# Patient Record
Sex: Male | Born: 1955
Health system: Southern US, Community
[De-identification: ages and names within clinical notes are randomized; demographics above are authoritative.]

## PROBLEM LIST (undated history)

## (undated) DIAGNOSIS — R911 Solitary pulmonary nodule: Secondary | ICD-10-CM

## (undated) DIAGNOSIS — Z87442 Personal history of urinary calculi: Secondary | ICD-10-CM

## (undated) DIAGNOSIS — K760 Fatty (change of) liver, not elsewhere classified: Secondary | ICD-10-CM

## (undated) DIAGNOSIS — I5189 Other ill-defined heart diseases: Secondary | ICD-10-CM

## (undated) DIAGNOSIS — G4730 Sleep apnea, unspecified: Secondary | ICD-10-CM

## (undated) DIAGNOSIS — I2510 Atherosclerotic heart disease of native coronary artery without angina pectoris: Secondary | ICD-10-CM

## (undated) DIAGNOSIS — E785 Hyperlipidemia, unspecified: Secondary | ICD-10-CM

## (undated) DIAGNOSIS — H269 Unspecified cataract: Secondary | ICD-10-CM

## (undated) DIAGNOSIS — I5032 Chronic diastolic (congestive) heart failure: Secondary | ICD-10-CM

## (undated) DIAGNOSIS — H5440 Blindness, one eye, unspecified eye: Secondary | ICD-10-CM

## (undated) DIAGNOSIS — Z8719 Personal history of other diseases of the digestive system: Secondary | ICD-10-CM

## (undated) DIAGNOSIS — R0789 Other chest pain: Secondary | ICD-10-CM

## (undated) DIAGNOSIS — C801 Malignant (primary) neoplasm, unspecified: Secondary | ICD-10-CM

## (undated) DIAGNOSIS — R945 Abnormal results of liver function studies: Secondary | ICD-10-CM

## (undated) DIAGNOSIS — R0602 Shortness of breath: Secondary | ICD-10-CM

## (undated) DIAGNOSIS — I509 Heart failure, unspecified: Secondary | ICD-10-CM

## (undated) DIAGNOSIS — I10 Essential (primary) hypertension: Secondary | ICD-10-CM

## (undated) DIAGNOSIS — K219 Gastro-esophageal reflux disease without esophagitis: Secondary | ICD-10-CM

## (undated) HISTORY — DX: Unspecified cataract: H26.9

## (undated) HISTORY — DX: Blindness, one eye, unspecified eye: H54.40

## (undated) HISTORY — DX: Gastro-esophageal reflux disease without esophagitis: K21.9

## (undated) HISTORY — DX: Sleep apnea, unspecified: G47.30

## (undated) HISTORY — DX: Fatty (change of) liver, not elsewhere classified: K76.0

## (undated) HISTORY — PX: COLONOSCOPY: SHX174

## (undated) HISTORY — DX: Other chest pain: R07.89

## (undated) HISTORY — DX: Chronic diastolic (congestive) heart failure: I50.32

## (undated) HISTORY — DX: Personal history of other diseases of the digestive system: Z87.19

## (undated) HISTORY — PX: SKIN CANCER EXCISION: SHX779

## (undated) HISTORY — DX: Abnormal results of liver function studies: R94.5

## (undated) HISTORY — DX: Malignant (primary) neoplasm, unspecified: C80.1

## (undated) HISTORY — DX: Other ill-defined heart diseases: I51.89

## (undated) HISTORY — DX: Atherosclerotic heart disease of native coronary artery without angina pectoris: I25.10

## (undated) HISTORY — DX: Hyperlipidemia, unspecified: E78.5

## (undated) HISTORY — DX: Shortness of breath: R06.02

## (undated) HISTORY — DX: Essential (primary) hypertension: I10

## (undated) HISTORY — PX: CARDIAC CATHETERIZATION: SHX172

## (undated) HISTORY — DX: Solitary pulmonary nodule: R91.1

---
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
## 2004-07-04 ENCOUNTER — Ambulatory Visit: Payer: Self-pay | Admitting: Gastroenterology

## 2004-07-18 ENCOUNTER — Ambulatory Visit: Payer: Self-pay | Admitting: Gastroenterology

## 2005-08-28 ENCOUNTER — Ambulatory Visit: Payer: Self-pay | Admitting: Internal Medicine

## 2006-03-20 ENCOUNTER — Emergency Department (HOSPITAL_COMMUNITY): Admission: EM | Admit: 2006-03-20 | Discharge: 2006-03-20 | Payer: Self-pay | Admitting: Emergency Medicine

## 2006-07-02 ENCOUNTER — Ambulatory Visit: Payer: Self-pay | Admitting: Internal Medicine

## 2006-10-29 ENCOUNTER — Ambulatory Visit: Payer: Self-pay | Admitting: Internal Medicine

## 2006-10-29 LAB — CONVERTED CEMR LAB
ALT: 50 units/L — ABNORMAL HIGH (ref 0–40)
AST: 36 units/L (ref 0–37)
Albumin: 3.8 g/dL (ref 3.5–5.2)
BUN: 21 mg/dL (ref 6–23)
Basophils Relative: 0.4 % (ref 0.0–1.0)
Bilirubin Urine: NEGATIVE
Bilirubin, Direct: 0.1 mg/dL (ref 0.0–0.3)
CO2: 31 meq/L (ref 19–32)
Calcium: 9.5 mg/dL (ref 8.4–10.5)
Cholesterol: 213 mg/dL (ref 0–200)
Crystals: NEGATIVE
Direct LDL: 146.8 mg/dL
Eosinophils Absolute: 0.1 10*3/uL (ref 0.0–0.6)
GFR calc non Af Amer: 76 mL/min
HCT: 45.6 % (ref 39.0–52.0)
HDL: 37.9 mg/dL — ABNORMAL LOW (ref >39.0)
Hemoglobin: 15.7 g/dL (ref 13.0–17.0)
Ketones, ur: NEGATIVE mg/dL
MCV: 87.1 fL (ref 78.0–100.0)
Monocytes Absolute: 0.4 10*3/uL (ref 0.2–0.7)
Mucus, UA: NEGATIVE
Neutro Abs: 3.5 10*3/uL (ref 1.4–7.7)
Nitrite: NEGATIVE
PSA: 1.12 ng/mL (ref 0.10–4.00)
Platelets: 196 10*3/uL (ref 150–400)
Sodium: 140 meq/L (ref 135–145)
Specific Gravity, Urine: >=1.03 (ref 1.000–1.03)
Squamous Epithelial / LPF: NEGATIVE /lpf
VLDL: 23 mg/dL (ref 0–40)
WBC, UA: NONE SEEN cells/hpf
pH: 6 (ref 5.0–8.0)

## 2006-11-05 ENCOUNTER — Ambulatory Visit: Payer: Self-pay | Admitting: Internal Medicine

## 2006-11-26 ENCOUNTER — Encounter: Admission: RE | Admit: 2006-11-26 | Discharge: 2006-11-26 | Payer: Self-pay | Admitting: Internal Medicine

## 2007-11-17 ENCOUNTER — Ambulatory Visit: Payer: Self-pay | Admitting: Internal Medicine

## 2007-11-17 LAB — CONVERTED CEMR LAB
AST: 24 units/L (ref 0–37)
Albumin: 4.2 g/dL (ref 3.5–5.2)
Alkaline Phosphatase: 56 units/L (ref 39–117)
BUN: 21 mg/dL (ref 6–23)
Basophils Relative: 0.6 % (ref 0.0–1.0)
Bilirubin, Direct: 0.1 mg/dL (ref 0.0–0.3)
Calcium: 9.7 mg/dL (ref 8.4–10.5)
Cholesterol: 212 mg/dL (ref 0–200)
Direct LDL: 144.3 mg/dL
GFR calc Af Amer: 92 mL/min
GFR calc non Af Amer: 76 mL/min
HCT: 43 % (ref 39.0–52.0)
Hemoglobin: 14.4 g/dL (ref 13.0–17.0)
Leukocytes, UA: NEGATIVE
Lymphocytes Relative: 26 % (ref 12.0–46.0)
Neutrophils Relative %: 65 % (ref 43.0–77.0)
PSA: 1.02 ng/mL (ref 0.10–4.00)
Potassium: 4.4 meq/L (ref 3.5–5.1)
RBC: 4.93 M/uL (ref 4.22–5.81)
Sodium: 140 meq/L (ref 135–145)
Specific Gravity, Urine: >=1.03 (ref 1.000–1.03)
Triglycerides: 111 mg/dL (ref 0–149)
Urine Glucose: NEGATIVE mg/dL
Urobilinogen, UA: 0.2 (ref 0.0–1.0)
WBC: 6.7 10*3/uL (ref 4.5–10.5)

## 2007-11-25 ENCOUNTER — Ambulatory Visit: Payer: Self-pay | Admitting: Internal Medicine

## 2007-11-25 DIAGNOSIS — E785 Hyperlipidemia, unspecified: Secondary | ICD-10-CM | POA: Insufficient documentation

## 2007-11-25 DIAGNOSIS — Z8639 Personal history of other endocrine, nutritional and metabolic disease: Secondary | ICD-10-CM

## 2007-11-25 DIAGNOSIS — Z862 Personal history of diseases of the blood and blood-forming organs and certain disorders involving the immune mechanism: Secondary | ICD-10-CM | POA: Insufficient documentation

## 2007-11-25 DIAGNOSIS — K7689 Other specified diseases of liver: Secondary | ICD-10-CM | POA: Insufficient documentation

## 2007-11-25 DIAGNOSIS — I10 Essential (primary) hypertension: Secondary | ICD-10-CM | POA: Insufficient documentation

## 2007-12-11 ENCOUNTER — Telehealth: Payer: Self-pay | Admitting: Internal Medicine

## 2008-09-23 ENCOUNTER — Encounter: Payer: Self-pay | Admitting: Internal Medicine

## 2008-09-24 ENCOUNTER — Encounter: Payer: Self-pay | Admitting: Internal Medicine

## 2009-05-10 ENCOUNTER — Ambulatory Visit: Payer: Self-pay | Admitting: Internal Medicine

## 2009-05-10 DIAGNOSIS — R079 Chest pain, unspecified: Secondary | ICD-10-CM | POA: Insufficient documentation

## 2009-06-22 ENCOUNTER — Encounter (INDEPENDENT_AMBULATORY_CARE_PROVIDER_SITE_OTHER): Payer: Self-pay | Admitting: *Deleted

## 2009-08-09 ENCOUNTER — Ambulatory Visit: Payer: Self-pay | Admitting: Cardiology

## 2009-08-09 DIAGNOSIS — F17200 Nicotine dependence, unspecified, uncomplicated: Secondary | ICD-10-CM | POA: Insufficient documentation

## 2009-08-30 ENCOUNTER — Encounter: Payer: Self-pay | Admitting: Internal Medicine

## 2009-09-06 ENCOUNTER — Ambulatory Visit: Payer: Self-pay

## 2009-09-06 ENCOUNTER — Ambulatory Visit: Payer: Self-pay | Admitting: Cardiology

## 2009-09-06 ENCOUNTER — Encounter: Payer: Self-pay | Admitting: Cardiology

## 2009-09-06 ENCOUNTER — Ambulatory Visit (HOSPITAL_COMMUNITY): Admission: RE | Admit: 2009-09-06 | Discharge: 2009-09-06 | Payer: Self-pay | Admitting: Cardiology

## 2009-09-07 ENCOUNTER — Ambulatory Visit: Payer: Self-pay

## 2009-09-12 ENCOUNTER — Encounter: Payer: Self-pay | Admitting: Internal Medicine

## 2009-11-08 ENCOUNTER — Ambulatory Visit: Payer: Self-pay | Admitting: Internal Medicine

## 2009-11-08 LAB — CONVERTED CEMR LAB
ALT: 23 units/L (ref 0–53)
AST: 22 units/L (ref 0–37)
Albumin: 4.6 g/dL (ref 3.5–5.2)
Alkaline Phosphatase: 62 units/L (ref 39–117)
BUN: 21 mg/dL (ref 6–23)
Bilirubin, Direct: 0.1 mg/dL (ref 0.0–0.3)
CO2: 29 meq/L (ref 19–32)
Calcium: 9.6 mg/dL (ref 8.4–10.5)
Glucose, Bld: 93 mg/dL (ref 70–99)
HCT: 44.1 % (ref 39.0–52.0)
Hemoglobin: 15.1 g/dL (ref 13.0–17.0)
Indirect Bilirubin: 0.4 mg/dL (ref 0.0–0.9)
LDL Cholesterol: 154 mg/dL — ABNORMAL HIGH (ref 0–99)
Neutro Abs: 4.6 10*3/uL (ref 1.7–7.7)
Neutrophils Relative %: 65 % (ref 43–77)
Potassium: 4.8 meq/L (ref 3.5–5.3)
Total Bilirubin: 0.5 mg/dL (ref 0.3–1.2)
VLDL: 20 mg/dL (ref 0–40)
WBC: 7.1 10*3/uL (ref 4.0–10.5)

## 2009-11-15 ENCOUNTER — Ambulatory Visit: Payer: Self-pay | Admitting: Internal Medicine

## 2010-07-05 ENCOUNTER — Telehealth (INDEPENDENT_AMBULATORY_CARE_PROVIDER_SITE_OTHER): Payer: Self-pay | Admitting: *Deleted

## 2010-09-27 NOTE — Medication Information (Signed)
Summary: Possible Nonadherence with Lisinopril/[facility]  Possible Nonadherence with Lisinopril/[facility]   Imported By: Lanelle Bal 09/07/2009 12:10:43  _____________________________________________________________________  External Attachment:    Type:   Image     Comment:   External Document

## 2010-09-27 NOTE — Assessment & Plan Note (Signed)
Summary: 6 MON F/U/HEA, rescheduled- jr   Vital Signs:  Patient profile:   55 year old male Height:      69 inches Weight:      208.50 pounds BMI:     30.90 O2 Sat:      100 % on Room air Temp:     98.2 degrees F oral Pulse rate:   75 / minute Pulse rhythm:   regular Resp:     16 per minute BP sitting:   116 / 80  (right arm) Cuff size:   large  Vitals Entered By: Glendell Docker CMA (November 15, 2009 9:48 AM)  O2 Flow:  Room air CC: Rm 2- 6 month Follow up disease management Comments no concerns   Primary Care Provider:  DThomos Lemons DO  CC:  Rm 2- 6 month Follow up disease management.  History of Present Illness:  Hypertension Follow-Up      This is a 55 year old man who presents for Hypertension follow-up.  The patient denies lightheadedness, headaches, and edema.  The patient denies the following associated symptoms: chest pain.  Compliance with medications (by patient report) has been near 100%.  The patient reports that dietary compliance has been fair.  The patient reports exercising occasionally.    Hyperlipidemia - does not want to take statin.  sausage or bacon on Sunday  planning trip to [place] for anniversay 14 th  Preventive Screening-Counseling & Management  Alcohol-Tobacco     Smoking Status: current-Pipe  Allergies (verified): No Known Drug Allergies  Past History:  Past Medical History: Current Problems:  HYPERTENSION (ICD-401.9) HYPERLIPIDEMIA (ICD-272.4)   FATTY LIVER DISEASE (ICD-571.8) LIVER FUNCTION TESTS, ABNORMAL, HX OF (ICD-V12.2) GERD Blind Left eye since birth  Past Surgical History: Tonsillectomy     Contraindications/Deferment of Procedures/Staging:    Test/Procedure: FLU VAX    Reason for deferment: patient declined   Family History: Mother deceased age 23 - colon cancer Father deceased age 19 - lung cancer Mother had diabetes Father with MI at age 42 and CABG     Social History: Married 13 years (3rd) 1  daughter 34 Current Smoker - pipe smoker (on and off) Alcohol use-yes (social - wine) Occupation:  Curator   Smoking Status:  current-Pipe  Physical Exam  General:  alert, well-developed, and well-nourished.   Neck:  No deformities, masses, or tenderness noted.no carotid bruits.   Lungs:  normal respiratory effort and normal breath sounds.   Heart:  normal rate, regular rhythm, no murmur, and no gallop.   Pulses:  dorsalis pedis and posterior tibial pulses are full and equal bilaterally Extremities:  No lower extremity edema    Impression & Recommendations:  Problem # 1:  HYPERTENSION (ICD-401.9) stable.  Maintain current medication regimen.  His updated medication list for this problem includes:    Lisinopril-hydrochlorothiazide 10-12.5 Mg Tabs (Lisinopril-hydrochlorothiazide) .Marland Kitchen... 1/2  tab by mouth once daily  BP today: 116/80 Prior BP: 120/70 (08/09/2009)  Labs Reviewed: K+: 4.8 (11/08/2009) Creat: : 1.13 (11/08/2009)   Chol: 216 (11/08/2009)   HDL: 42 (11/08/2009)   LDL: 154 (11/08/2009)   TG: 101 (11/08/2009)  Problem # 2:  HYPERLIPIDEMIA (ICD-272.4) still reluctant to take statin.   use red yeast rice otc Labs Reviewed: SGOT: 22 (11/08/2009)   SGPT: 23 (11/08/2009)   HDL:42 (11/08/2009), 36.0 (11/17/2007)  LDL:154 (11/08/2009), DEL (11/17/2007)  Chol:216 (11/08/2009), 212 (11/17/2007)  Trig:101 (11/08/2009), 111 (11/17/2007)  Complete Medication List: 1)  Lisinopril-hydrochlorothiazide 10-12.5 Mg Tabs (Lisinopril-hydrochlorothiazide) .Marland KitchenMarland KitchenMarland Kitchen  1/2  tab by mouth once daily 2)  Fish Oil 1000 Mg Caps (Omega-3 fatty acids) .... 5 capsules by mouth once daily  Patient Instructions: 1)  Please schedule a follow-up appointment in 1 year. 2)  BMP prior to visit, ICD-9: 401.9 3)  Lipid Panel prior to visit, ICD-9: 272.4 4)  PSA:  v76.44 5)  Use metamucile daily 6)  Red yeast rice supplement 7)  Reduce your intake of saturated  fats Prescriptions: LISINOPRIL-HYDROCHLOROTHIAZIDE 10-12.5 MG  TABS (LISINOPRIL-HYDROCHLOROTHIAZIDE) 1/2  tab by mouth once daily  #90 x 3   Entered and Authorized by:   D. Thomos Lemons DO   Signed by:   D. Thomos Lemons DO on 11/15/2009   Method used:   Electronically to        [facility]* (retail)       [address]       [place], [place]  [address]       Ph: [phone]       Fax: [phone]   RxID:   [id number]   Current Allergies (reviewed today): No known allergies    Immunization History:  Influenza Immunization History:    Influenza:  declined (11/15/2009)

## 2010-09-27 NOTE — Progress Notes (Signed)
  Phone Note Other Incoming   Request: Send information Summary of Call: Request for records received from [employer]. Request forwarded to [facility].

## 2010-09-27 NOTE — Medication Information (Signed)
Summary: Possible Nonadherence with Lisinopril/[facility]  Possible Nonadherence with Lisinopril/[facility]   Imported By: Lanelle Bal 09/22/2009 14:38:26  _____________________________________________________________________  External Attachment:    Type:   Image     Comment:   External Document

## 2010-10-24 ENCOUNTER — Encounter: Payer: Self-pay | Admitting: Internal Medicine

## 2010-10-24 ENCOUNTER — Ambulatory Visit (INDEPENDENT_AMBULATORY_CARE_PROVIDER_SITE_OTHER): Payer: 59 | Admitting: Internal Medicine

## 2010-10-24 DIAGNOSIS — M545 Low back pain, unspecified: Secondary | ICD-10-CM | POA: Insufficient documentation

## 2010-10-24 DIAGNOSIS — M549 Dorsalgia, unspecified: Secondary | ICD-10-CM

## 2010-10-24 LAB — CONVERTED CEMR LAB
Bilirubin Urine: NEGATIVE
Glucose, Urine, Semiquant: NEGATIVE
Ketones, urine, test strip: NEGATIVE
Nitrite: NEGATIVE
Protein, U semiquant: NEGATIVE
Specific Gravity, Urine: >=1.03
Urobilinogen, UA: 0.2

## 2010-10-31 ENCOUNTER — Ambulatory Visit (INDEPENDENT_AMBULATORY_CARE_PROVIDER_SITE_OTHER): Payer: 59 | Admitting: Internal Medicine

## 2010-10-31 ENCOUNTER — Encounter: Payer: Self-pay | Admitting: Internal Medicine

## 2010-10-31 DIAGNOSIS — I10 Essential (primary) hypertension: Secondary | ICD-10-CM

## 2010-10-31 DIAGNOSIS — M545 Low back pain, unspecified: Secondary | ICD-10-CM

## 2010-11-02 NOTE — Letter (Signed)
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 [address]   [place], [place] [address]   Phone: [phone]  Fax: [phone]    October 24, 2010   Employee:  FOX SALMINEN    To Whom It May Concern:   For Medical reasons, please excuse the above named employee from work for the following dates:  Start:   10/24/10  End:   11/01/10  Pt may return to work on this date.  If you need additional information, please feel free to contact our office.         Sincerely,    Mervin Kung CMA (AAMA) Dr Artist Pais, DO

## 2010-11-02 NOTE — Letter (Signed)
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 If you need additional information, please feel free to contact our office.           Sincerely,    Glendell Docker CMA Dr. Thomos Lemons

## 2010-11-07 ENCOUNTER — Encounter: Payer: Self-pay | Admitting: Internal Medicine

## 2010-11-07 NOTE — Assessment & Plan Note (Signed)
Summary: BACK PAIN/SS   Vital Signs:  Patient profile:   55 year old male Height:      69 inches Weight:      208.50 pounds BMI:     30.90 O2 Sat:      100 % on Room air Temp:     98.4 degrees F oral Pulse rate:   70 / minute BP sitting:   142 / 80  (right arm) Cuff size:   large  Vitals Entered By: Glendell Docker CMA (October 24, 2010 3:06 PM)  O2 Flow:  Room air CC: Back Pain Is Patient Diabetic? No Pain Assessment Patient in pain? yes     Location: lower back Intensity: 8 Type: dull Onset of pain  With activity Comments c/o lower back pain onset 2 weeks ago- denies injury, states increase in pain knife life sensation with movement, taken aspirin with some relief   Primary Care Provider:  Dondra Spry DO  CC:  Back Pain.  History of Present Illness: 55 year old c/o  sharp low back pain onset 2 weeks he was cutting and unloading wood - felt slightly stiff  (tuesday) 1 week ago felt stiff again 2 - 3 days later - symptoms much more severe pt could not get out bed  no urinary complaints no lower extremity weakness  Preventive Screening-Counseling & Management  Alcohol-Tobacco     Smoking Status: current  Allergies (verified): No Known Drug Allergies  Past History:  Past Medical History: Current Problems:  HYPERTENSION (ICD-401.9)  HYPERLIPIDEMIA (ICD-272.4)   FATTY LIVER DISEASE (ICD-571.8) LIVER FUNCTION TESTS, ABNORMAL, HX OF (ICD-V12.2) GERD Blind Left eye since birth  Social History: Smoking Status:  current  Physical Exam  General:  alert, well-developed, and well-nourished.   Lungs:  normal respiratory effort, normal breath sounds, no crackles, and no wheezes.   Heart:  normal rate, regular rhythm, no murmur, and no gallop.   Msk:  tight hamstrings bilaterally pain with lumbar flexion and side bending  negative straight leg test Neurologic:  cranial nerves II-XII intact and gait normal.     Impression & Recommendations:  Problem  # 1:  BACK PAIN, LUMBAR (ICD-4.46) 55 year old with acute low back pain I suspect pain is discogenic Use prednisone taper Take oxycodone as needed Out of work for one week Patient advised to call office if symptoms persist or worsen.  His updated medication list for this problem includes:    Oxycodone-acetaminophen 5-325 Mg Tabs (Oxycodone-acetaminophen) ..... One by mouth two times a day as needed for low back pain  Complete Medication List: 1)  Lisinopril-hydrochlorothiazide 10-12.5 Mg Tabs (Lisinopril-hydrochlorothiazide) .Marland Kitchen.. 1  tab by mouth once daily 2)  Fish Oil 1000 Mg Caps (Omega-3 fatty acids) .... 5 capsules by mouth once daily 3)  Oxycodone-acetaminophen 5-325 Mg Tabs (Oxycodone-acetaminophen) .... One by mouth two times a day as needed for low back pain  Other Orders: UA Dipstick w/o Micro (manual) ([address])  Patient Instructions: 1)  Please schedule a follow-up appointment in 1 week. Prescriptions: LISINOPRIL-HYDROCHLOROTHIAZIDE 10-12.5 MG  TABS (LISINOPRIL-HYDROCHLOROTHIAZIDE) 1  tab by mouth once daily  #90 x 1   Entered and Authorized by:   D. Thomos Lemons DO   Signed by:   D. Thomos Lemons DO on 10/24/2010   Method used:   Electronically to        [facility]* (retail)       [address]       [place], [place]  [id number]  Ph: [phone]       Fax: [phone]   RxID:   [id number] OXYCODONE-ACETAMINOPHEN 5-325 MG TABS (OXYCODONE-ACETAMINOPHEN) one by mouth two times a day as needed for low back pain  #30 x 0   Entered and Authorized by:   D. Thomos Lemons DO   Signed by:   D. Thomos Lemons DO on 10/24/2010   Method used:   Print then Give to Patient   RxID:   [id number] PREDNISONE 20 MG TABS (PREDNISONE) one by mouth two times a day x 4 days,  then one by mouth once daily x 4 days,  then 1/2 by mouth once daily  x 4 days  #20 x 0   Entered and Authorized by:   D. Thomos Lemons DO   Signed by:   D. Thomos Lemons DO on 10/24/2010    Method used:   Electronically to        [facility]* (retail)       [address]       [place], [place]  [address]       Ph: [phone]       Fax: [phone]   RxID:   [id number]    Orders Added: 1)  UA Dipstick w/o Micro (manual) [81002] 2)  Est. Patient Level III [[address]]    Current Allergies (reviewed today): No known allergies    Laboratory Results   Urine Tests    Routine Urinalysis   Color: yellow Appearance: Clear Glucose: negative   (Normal Range: Negative) Bilirubin: negative   (Normal Range: Negative) Ketone: negative   (Normal Range: Negative) Spec. Gravity: >=1.030   (Normal Range: 1.003-1.035) Blood: trace-intact   (Normal Range: Negative) pH: 5.0   (Normal Range: 5.0-8.0) Protein: negative   (Normal Range: Negative) Urobilinogen: 0.2   (Normal Range: 0-1) Nitrite: negative   (Normal Range: Negative) Leukocyte Esterace: negative   (Normal Range: Negative)

## 2010-11-07 NOTE — Letter (Signed)
Summary: Work Dietitian at [employer]. [address]   [place], [place] [address]   Phone: [phone]  Fax: [phone]       Today's Date: October 31, 2010     Name of Patient: Ryan [name]   The above named patient had a medical visit today  Please take this into consideration when reviewing the time away from work.  Special Instructions:  [ X] None  [  ] To be off the remainder of today, returning to the normal work / school schedule tomorrow.  [  ] To be off until the next scheduled appointment on ______________________.  [ X] He is cleared to reusme his normal work schedue 11/01/2010    Sincerely yours,   Glendell Docker CMA Dr. Thomos Lemons

## 2010-11-23 NOTE — Assessment & Plan Note (Signed)
Summary: 1 week folloow up/mhf   Vital Signs:  Patient profile:   55 year old male Height:      69 inches Weight:      222.75 pounds BMI:     33.01 O2 Sat:      99 % on Room air Temp:     98.2 degrees F oral Pulse rate:   70 / minute Resp:     18 per minute BP sitting:   132 / 80  (right arm) Cuff size:   large  Vitals Entered By: Glendell Docker CMA (October 31, 2010 9:15 AM)  O2 Flow:  Room air CC: 1 Week Follow up  Is Patient Diabetic? No Pain Assessment Patient in pain? no      Comments no concerns   Primary Care Provider:  Dondra Spry DO  CC:  1 Week Follow up .  History of Present Illness: 55 year old white male for followup regarding low back pain Patient was seen on Tuesday and by Saturday low back pain was significantly better He only took 2 oxycodone. he felt  pain medication made him feel sleepy but did not significantly reduce his pain increased appetite with prednisone use   Preventive Screening-Counseling & Management  Alcohol-Tobacco     Smoking Status: current     Cigars/week: 1  Allergies (verified): No Known Drug Allergies  Past History:  Past Medical History:  HYPERTENSION (ICD-401.9)   HYPERLIPIDEMIA (ICD-272.4)   FATTY LIVER DISEASE (ICD-571.8) LIVER FUNCTION TESTS, ABNORMAL, HX OF (ICD-V12.2) GERD Blind Left eye since birth  Past Surgical History: Tonsillectomy       Family History: Mother deceased age 53 - colon cancer Father deceased age 43 - lung cancer Mother had diabetes Father with MI at age 25 and CABG      Social History: Married 13 years (3rd) 1 daughter 46 Current Smoker - pipe smoker (on and off) Alcohol use-yes (social - wine)  Occupation:  Curator   Smoking Status:  current  Physical Exam  General:  alert, well-developed, and well-nourished.   Lungs:  normal respiratory effort and normal breath sounds.   Heart:  normal rate, regular rhythm, no murmur, and no gallop.   Msk:  tight hamstrings  bilaterally Neurologic:  cranial nerves II-XII intact and gait normal.     Impression & Recommendations:  Problem # 1:  BACK PAIN, LUMBAR (ICD-724.2) Assessment Improved low back pain is much better We discussed higher risk recurrence of low back pain encouraged proper technique with lifting/carrying objects  Reviewed 3-4 exercises he is to perform at home x4-6 weeks  His updated medication list for this problem includes:    Oxycodone-acetaminophen 5-325 Mg Tabs (Oxycodone-acetaminophen) ..... One by mouth two times a day as needed for low back pain  Problem # 2:  HYPERTENSION (ICD-401.9) Assessment: Unchanged  His updated medication list for this problem includes:    Lisinopril-hydrochlorothiazide 10-12.5 Mg Tabs (Lisinopril-hydrochlorothiazide) .Marland Kitchen... 1  tab by mouth once daily  Future Orders: T-Basic Metabolic Panel [id number]) ... 02/05/2011  BP today: 132/80 Prior BP: 116/80 (11/15/2009)  Labs Reviewed: K+: 4.8 (11/08/2009) Creat: : 1.13 (11/08/2009)   Chol: 216 (11/08/2009)   HDL: 42 (11/08/2009)   LDL: 154 (11/08/2009)   TG: 101 (11/08/2009)  Complete Medication List: 1)  Lisinopril-hydrochlorothiazide 10-12.5 Mg Tabs (Lisinopril-hydrochlorothiazide) .Marland Kitchen.. 1  tab by mouth once daily 2)  Fish Oil 1000 Mg Caps (Omega-3 fatty acids) .... 5 capsules by mouth once daily 3)  Oxycodone-acetaminophen 5-325 Mg Tabs (Oxycodone-acetaminophen) .Marland KitchenMarland KitchenMarland Kitchen  One by mouth two times a day as needed for low back pain  Other Orders: Future Orders: T-Hepatic Function [id number]) ... 02/05/2011 T-Lipid Profile [id number]) ... 02/05/2011 T-PSA ([id number]) ... 02/05/2011  Patient Instructions: 1)  Perform home exercises as directed 2)  Please schedule a follow-up appointment in 3 months. 3)  BMP prior to visit, ICD-9:   401.9 4)  Hepatic Panel prior to visit, ICD-9:  272.4 5)  Lipid Panel prior to visit, ICD-9: 272.4 6)  PSA prior to visit, ICD-9: v76.44 7)  Please return for lab  work one (1) week before your next appointment.    Orders Added: 1)  T-Basic Metabolic Panel [[id number]] 2)  T-Hepatic Function [[id number]] 3)  T-Lipid Profile [[id number]] 4)  T-PSA [[id number]] 5)  Est. Patient Level III [28413]    Current Allergies (reviewed today): No known allergies

## 2011-01-30 ENCOUNTER — Telehealth: Payer: Self-pay | Admitting: Internal Medicine

## 2011-01-30 NOTE — Telephone Encounter (Signed)
Pt has upcoming appt on 02-06-11. Pt will be going downstairs to have labs done sometime this week and wanted to make sure orders were in.

## 2011-01-30 NOTE — Telephone Encounter (Signed)
Lab orders are already submitted to [facility]

## 2011-01-31 ENCOUNTER — Encounter: Payer: Self-pay | Admitting: Internal Medicine

## 2011-01-31 ENCOUNTER — Other Ambulatory Visit: Payer: Self-pay | Admitting: Internal Medicine

## 2011-01-31 LAB — HEPATIC FUNCTION PANEL: Total Protein: 7.1 g/dL (ref 6.0–8.3)

## 2011-01-31 LAB — BASIC METABOLIC PANEL: Potassium: 4.7 mEq/L (ref 3.5–5.3)

## 2011-01-31 LAB — LIPID PANEL: LDL Cholesterol: 184 mg/dL — ABNORMAL HIGH (ref 0–99)

## 2011-02-02 ENCOUNTER — Telehealth: Payer: Self-pay | Admitting: Internal Medicine

## 2011-02-02 NOTE — Telephone Encounter (Signed)
Reviewed his last note and last labs and provided his back is doing better and he is not frewquently requesting narcotics and he feels otherwise well ne can wait until July 2012

## 2011-02-02 NOTE — Telephone Encounter (Signed)
Patient refuses to see another doctor. Canceled appt on 02-06-11.

## 2011-02-06 ENCOUNTER — Ambulatory Visit: Payer: 59 | Admitting: Internal Medicine

## 2011-04-10 ENCOUNTER — Telehealth: Payer: Self-pay | Admitting: Internal Medicine

## 2011-04-10 NOTE — Telephone Encounter (Signed)
Patient is going to be going out of town and needs tetanus, meningitis, and Hep A and B vaccinations. He called [facility] and they told him they it would be a couple of weeks before they could get him in. Patient wanted to know if we could do these within in the week.

## 2011-04-11 NOTE — Telephone Encounter (Signed)
Call placed to patient at [phone], no answer. A detailed voice message was left for patient to return call regarding immunization

## 2011-04-17 NOTE — Telephone Encounter (Signed)
No return call from patient regarding immunization need.

## 2011-04-19 ENCOUNTER — Telehealth: Payer: Self-pay | Admitting: Internal Medicine

## 2011-04-19 NOTE — Telephone Encounter (Signed)
HE HAS TO HAVE HEP A AND B MENNINGITIS AND TETINUS BEFORE HE CAN GO OUT OF THE COUNTRY    HE NEEDS THESE DONE BEFORE SEPT 8TH.  PLEASE CALL HIM AND ADVISE IF WE CAN DO THESE IMMUNIZATIONS FOR HIM

## 2011-04-20 NOTE — Telephone Encounter (Signed)
Call placed to patient at [phone], no answer. A detailed voice message was left for patient to return call for clarification of request.

## 2011-04-24 NOTE — Telephone Encounter (Signed)
Call placed to patient at [phone], no answer. A detailed voice message was left for patient to return call regarding immunization request.

## 2011-04-25 NOTE — Telephone Encounter (Signed)
No return call from patient regarding request

## 2011-09-20 ENCOUNTER — Encounter: Payer: Self-pay | Admitting: Internal Medicine

## 2011-09-20 ENCOUNTER — Ambulatory Visit (INDEPENDENT_AMBULATORY_CARE_PROVIDER_SITE_OTHER): Payer: 59 | Admitting: Internal Medicine

## 2011-09-20 ENCOUNTER — Ambulatory Visit (HOSPITAL_BASED_OUTPATIENT_CLINIC_OR_DEPARTMENT_OTHER)
Admission: RE | Admit: 2011-09-20 | Discharge: 2011-09-20 | Disposition: A | Discharge status: Home / Self Care | Payer: 59 | Source: Ambulatory Visit | Attending: Internal Medicine | Admitting: Internal Medicine

## 2011-09-20 DIAGNOSIS — R6889 Other general symptoms and signs: Secondary | ICD-10-CM

## 2011-09-20 DIAGNOSIS — M792 Neuralgia and neuritis, unspecified: Secondary | ICD-10-CM | POA: Insufficient documentation

## 2011-09-20 DIAGNOSIS — Z800 Family history of malignant neoplasm of digestive organs: Secondary | ICD-10-CM

## 2011-09-20 DIAGNOSIS — IMO0002 Reserved for concepts with insufficient information to code with codable children: Secondary | ICD-10-CM | POA: Insufficient documentation

## 2011-09-20 DIAGNOSIS — R609 Edema, unspecified: Secondary | ICD-10-CM | POA: Insufficient documentation

## 2011-09-20 DIAGNOSIS — E785 Hyperlipidemia, unspecified: Secondary | ICD-10-CM

## 2011-09-20 DIAGNOSIS — Z79899 Other long term (current) drug therapy: Secondary | ICD-10-CM

## 2011-09-20 DIAGNOSIS — M79609 Pain in unspecified limb: Secondary | ICD-10-CM

## 2011-09-20 DIAGNOSIS — R209 Unspecified disturbances of skin sensation: Secondary | ICD-10-CM

## 2011-09-20 DIAGNOSIS — I10 Essential (primary) hypertension: Secondary | ICD-10-CM

## 2011-09-20 DIAGNOSIS — K219 Gastro-esophageal reflux disease without esophagitis: Secondary | ICD-10-CM

## 2011-09-20 LAB — HEPATIC FUNCTION PANEL
ALT: 30 U/L (ref 0–53)
AST: 26 U/L (ref 0–37)
Albumin: 4.8 g/dL (ref 3.5–5.2)
Bilirubin, Direct: 0.1 mg/dL (ref 0.0–0.3)
Indirect Bilirubin: 0.4 mg/dL (ref 0.0–0.9)
Total Protein: 7.1 g/dL (ref 6.0–8.3)

## 2011-09-20 LAB — LIPID PANEL
Cholesterol: 232 mg/dL — ABNORMAL HIGH (ref 0–200)
HDL: 43 mg/dL (ref >39)
Triglycerides: 104 mg/dL (ref <150)
VLDL: 21 mg/dL (ref 0–40)

## 2011-09-20 MED ORDER — OMEPRAZOLE 40 MG PO CPDR: 40.0000 mg | DELAYED_RELEASE_CAPSULE | Freq: Every day | ORAL | Status: DC

## 2011-09-20 NOTE — Assessment & Plan Note (Signed)
Obtain lipid/lft. 

## 2011-09-20 NOTE — Progress Notes (Signed)
  Subjective:    Patient ID: Ryan [name]., male    DOB: 04/12/1956, 56 y.o.   MRN: [id number]  HPI Pt presents to clinic for followup of multiple medical problems. Notes two recent episodes of left arm radiating pain without chest pain, neck pain, paresthesia or arm weakness. Does not h/o neck crepitus easily appreciated. No alleviating or exacerbating factors. Denies exertional trigger. H/o hyperlipidemia previously recommended for medication. Last LDL 184. Does have h/o fatty liver with last lft's nl. C/o intermittent "indigestion" described as RUQ abdominal discomfort without radiation, n/v, or dysphagia. States family h/o colon cancer in mother and recalls last colonoscopy ~06/2004. No other complaints.  Past Medical History  Diagnosis Date  . Hypertension   . Hyperlipidemia   . Fatty liver     fatty liver disease  . Abnormal liver function     history of   . GERD (gastroesophageal reflux disease)   . Blind left eye     blind left eye since birth   Past Surgical History  Procedure Date  . Tonsillectomy     reports that he has been smoking Pipe.  He has never used smokeless tobacco. He reports that he drinks alcohol. He reports that he does not use illicit drugs. family history includes Colon cancer in his mother; Diabetes in his mother; Heart attack in his father; and Lung cancer in his father. No Known Allergies    Review of Systems see hpi     Objective:   Physical Exam  Nursing note and vitals reviewed. Constitutional: He appears well-developed and well-nourished. No distress.  HENT:  Head: Normocephalic and atraumatic.  Eyes: Conjunctivae are normal. No scleral icterus.  Neck: Neck supple.  Cardiovascular: Normal rate, regular rhythm and normal heart sounds.  Exam reveals no gallop and no friction rub.   No murmur heard. Pulmonary/Chest: Effort normal and breath sounds normal. No respiratory distress. He has no wheezes. He has no rales.  Abdominal: Soft. Normal  appearance. He exhibits no distension. There is no hepatosplenomegaly. There is no tenderness. There is no rebound and no guarding.  Musculoskeletal:       FROM left arm. 5/5 distal strength  Skin: Skin is warm and dry. He is not diaphoretic.  Psychiatric: He has a normal mood and affect.          Assessment & Plan:

## 2011-09-20 NOTE — Assessment & Plan Note (Signed)
GI referral for colonoscopy

## 2011-09-20 NOTE — Assessment & Plan Note (Signed)
Attempt PPI qd.

## 2011-09-20 NOTE — Assessment & Plan Note (Addendum)
Typically normotensive. Continue current regimen. Monitor bp as outpt. Obtain chem7. EKG obtained demonstrates nsr 70 with nl intervals and axis. No evidence of acute ischemic change

## 2011-09-20 NOTE — Assessment & Plan Note (Signed)
Obtain plain radiograph of cspine.

## 2011-09-21 LAB — BASIC METABOLIC PANEL
Calcium: 9.9 mg/dL (ref 8.4–10.5)
Chloride: 99 mEq/L (ref 96–112)
Creat: 1.2 mg/dL (ref 0.50–1.35)
Glucose, Bld: 93 mg/dL (ref 70–99)

## 2011-09-27 ENCOUNTER — Telehealth: Payer: Self-pay | Admitting: *Deleted

## 2011-09-27 DIAGNOSIS — E785 Hyperlipidemia, unspecified: Secondary | ICD-10-CM

## 2011-09-27 MED ORDER — ATORVASTATIN CALCIUM 20 MG PO TABS: 20.0000 mg | ORAL_TABLET | Freq: Every day | ORAL | Status: DC

## 2011-09-27 NOTE — Telephone Encounter (Signed)
Notified pt. He requests rx be sent to [facility]. Pt will return to the lab 1 week before next appt on 10/30/11. Future lab order has been placed and copy forwarded to the lab.

## 2011-09-27 NOTE — Telephone Encounter (Signed)
Message copied by Kathi Simpers on Thu Sep 27, 2011  4:48 PM ------      Message from: Staci Righter.      Created: Sun Sep 23, 2011  4:23 PM       Chol remains high. Recommend attempt lipitor 20mg  qd. Check lipid/lft in 1 month 272.4

## 2011-10-15 ENCOUNTER — Encounter: Payer: Self-pay | Admitting: Gastroenterology

## 2011-10-15 ENCOUNTER — Ambulatory Visit (AMBULATORY_SURGERY_CENTER): Payer: 59 | Admitting: *Deleted

## 2011-10-15 VITALS — Ht 69.0 in | Wt 219.8 lb

## 2011-10-15 DIAGNOSIS — Z1211 Encounter for screening for malignant neoplasm of colon: Secondary | ICD-10-CM

## 2011-10-15 MED ORDER — PEG-KCL-NACL-NASULF-NA ASC-C 100 G PO SOLR: ORAL | Status: DC

## 2011-10-19 ENCOUNTER — Other Ambulatory Visit: Payer: Self-pay | Admitting: *Deleted

## 2011-10-19 DIAGNOSIS — E785 Hyperlipidemia, unspecified: Secondary | ICD-10-CM

## 2011-10-19 LAB — LIPID PANEL
Cholesterol: 137 mg/dL (ref 0–200)
HDL: 40 mg/dL (ref >39)
LDL Cholesterol: 83 mg/dL (ref 0–99)
VLDL: 14 mg/dL (ref 0–40)

## 2011-10-19 LAB — HEPATIC FUNCTION PANEL
ALT: 32 U/L (ref 0–53)
Indirect Bilirubin: 0.6 mg/dL (ref 0.0–0.9)
Total Bilirubin: 0.8 mg/dL (ref 0.3–1.2)
Total Protein: 7 g/dL (ref 6.0–8.3)

## 2011-10-22 ENCOUNTER — Encounter: Payer: Self-pay | Admitting: Family

## 2011-10-23 ENCOUNTER — Ambulatory Visit (INDEPENDENT_AMBULATORY_CARE_PROVIDER_SITE_OTHER): Payer: 59 | Admitting: Internal Medicine

## 2011-10-23 ENCOUNTER — Encounter: Payer: Self-pay | Admitting: Internal Medicine

## 2011-10-23 DIAGNOSIS — K219 Gastro-esophageal reflux disease without esophagitis: Secondary | ICD-10-CM

## 2011-10-23 DIAGNOSIS — L989 Disorder of the skin and subcutaneous tissue, unspecified: Secondary | ICD-10-CM

## 2011-10-23 DIAGNOSIS — Z23 Encounter for immunization: Secondary | ICD-10-CM

## 2011-10-23 DIAGNOSIS — M792 Neuralgia and neuritis, unspecified: Secondary | ICD-10-CM

## 2011-10-23 DIAGNOSIS — IMO0002 Reserved for concepts with insufficient information to code with codable children: Secondary | ICD-10-CM

## 2011-10-23 DIAGNOSIS — E785 Hyperlipidemia, unspecified: Secondary | ICD-10-CM

## 2011-10-23 NOTE — Assessment & Plan Note (Signed)
Well controlled with ppi. Continue qd

## 2011-10-23 NOTE — Patient Instructions (Signed)
Please schedule fasting labs prior to your physical Cbc, chem7, lipid/lft, tsh, ua with reflex micro, psa v70.0

## 2011-10-23 NOTE — Assessment & Plan Note (Signed)
Consider possible AK. ?cryo vs dermatology evaluation. Monitor and re-evaluate next visit

## 2011-10-23 NOTE — Assessment & Plan Note (Signed)
Resolved. cspine radiograph nl

## 2011-10-23 NOTE — Assessment & Plan Note (Signed)
Improved. Continue statin. Obtain lipid/lft with next visit

## 2011-10-23 NOTE — Progress Notes (Signed)
Addended by: Glendell Docker on: 10/23/2011 09:30 AM   Modules accepted: Orders

## 2011-10-23 NOTE — Progress Notes (Signed)
  Subjective:    Patient ID: Ryan [name]., male    DOB: 08-Oct-1955, 56 y.o.   MRN: [id number]  HPI Pt presents to clinic for followup of multiple medical problems. Cholesterol improved with lipitor. PPI resolved GI sx's. No further left arm pain. Has colonoscopy scheduled in near future. Notes chronic skin lesion x1 year right upper lateral neck. +dry and intermittently itchy. No change in size of other characteristics. Has seen dermatology in the past for other lesions.  Past Medical History  Diagnosis Date  . Hypertension   . Hyperlipidemia   . Fatty liver     fatty liver disease  . Abnormal liver function     history of   . GERD (gastroesophageal reflux disease)   . Blind left eye     blind left eye since birth   Past Surgical History  Procedure Date  . Tonsillectomy     reports that he has been smoking Cigars.  He has never used smokeless tobacco. He reports that he drinks about 1.2 ounces of alcohol per week. He reports that he does not use illicit drugs. family history includes Colon cancer in his mother; Diabetes in his mother; Heart attack in his father; and Lung cancer in his father.  There is no history of Stomach cancer. No Known Allergies    Review of Systems see hpi     Objective:   Physical Exam  Physical Exam  Nursing note and vitals reviewed. Constitutional: Appears well-developed and well-nourished. No distress.  HENT:  Head: Normocephalic and atraumatic.  Right Ear: External ear normal.  Left Ear: External ear normal.  Eyes: Conjunctivae are normal. No scleral icterus.  Neck: Neck supple. Carotid bruit is not present.  Cardiovascular: Normal rate, regular rhythm and normal heart sounds.  Exam reveals no gallop and no friction rub.   No murmur heard. Pulmonary/Chest: Effort normal and breath sounds normal. No respiratory distress. He has no wheezes. no rales.  Lymphadenopathy:    He has no cervical adenopathy.  Neurological:Alert.  Skin: Skin is  warm and dry. Not diaphoretic. left lateral neck 1cm area of mildly dry skin Psychiatric: Has a normal mood and affect.         Assessment & Plan:

## 2011-10-25 ENCOUNTER — Other Ambulatory Visit: Payer: 59 | Admitting: Internal Medicine

## 2011-10-25 ENCOUNTER — Other Ambulatory Visit: Payer: Self-pay | Admitting: *Deleted

## 2011-10-25 DIAGNOSIS — Z0000 Encounter for general adult medical examination without abnormal findings: Secondary | ICD-10-CM

## 2011-10-25 DIAGNOSIS — Z125 Encounter for screening for malignant neoplasm of prostate: Secondary | ICD-10-CM

## 2011-10-25 MED ORDER — LISINOPRIL-HYDROCHLOROTHIAZIDE 10-12.5 MG PO TABS: 1.0000 | ORAL_TABLET | Freq: Every day | ORAL | Status: DC

## 2011-10-25 MED ORDER — ATORVASTATIN CALCIUM 20 MG PO TABS: 20.0000 mg | ORAL_TABLET | Freq: Every day | ORAL | Status: DC

## 2011-10-25 NOTE — Telephone Encounter (Signed)
Rx refill sent to pharmacy. 

## 2011-10-30 ENCOUNTER — Ambulatory Visit: Payer: 59 | Admitting: Internal Medicine

## 2011-10-30 ENCOUNTER — Ambulatory Visit (AMBULATORY_SURGERY_CENTER): Payer: 59 | Admitting: Gastroenterology

## 2011-10-30 ENCOUNTER — Encounter: Payer: 59 | Admitting: Gastroenterology

## 2011-10-30 ENCOUNTER — Encounter: Payer: Self-pay | Admitting: Gastroenterology

## 2011-10-30 VITALS — BP 133/73 | HR 79 | Temp 96.0°F | Resp 14 | Ht 69.0 in | Wt 219.0 lb

## 2011-10-30 DIAGNOSIS — D128 Benign neoplasm of rectum: Secondary | ICD-10-CM

## 2011-10-30 DIAGNOSIS — D126 Benign neoplasm of colon, unspecified: Secondary | ICD-10-CM

## 2011-10-30 DIAGNOSIS — Z1211 Encounter for screening for malignant neoplasm of colon: Secondary | ICD-10-CM

## 2011-10-30 DIAGNOSIS — D129 Benign neoplasm of anus and anal canal: Secondary | ICD-10-CM

## 2011-10-30 DIAGNOSIS — Z800 Family history of malignant neoplasm of digestive organs: Secondary | ICD-10-CM

## 2011-10-30 MED ORDER — SODIUM CHLORIDE 0.9 % IV SOLN: 500.0000 mL | INTRAVENOUS | Status: DC

## 2011-10-30 NOTE — Op Note (Signed)
[facility] [address] [place], [place]  [address]  COLONOSCOPY PROCEDURE REPORT  PATIENT:  Ryan [name], Ryan [name]  MR#:  [id number] BIRTHDATE:  November 02, 1955, 55 yrs. old  GENDER:  male ENDOSCOPIST:  Barbette Hair. Arlyce Dice, MD REF. BY: PROCEDURE DATE:  10/30/2011 PROCEDURE:  Colonoscopy with snare polypectomy ASA CLASS:  Class I INDICATIONS:  Screening, family history of colon cancer Mother MEDICATIONS:   MAC sedation, administered by CRNA propofol 300mg IV  DESCRIPTION OF PROCEDURE:   After the risks benefits and alternatives of the procedure were thoroughly explained, informed consent was obtained.  Digital rectal exam was performed and revealed no abnormalities.   The [id number] endoscope was introduced through the anus and advanced to the cecum, which was identified by both the appendix and ileocecal valve, without limitations.  The quality of the prep was excellent, using MoviPrep.  The instrument was then slowly withdrawn as the colon was fully examined. <<PROCEDUREIMAGES>>  FINDINGS:  Two polyps were found in the sigmoid colon. 3 and 4mm sessile polyps at 18 and 22cm - removed with hot polypectomy snare and submitted to pathology. Photodocumentation not available. This was otherwise a normal examination of the colon (see image2 and image3).   Retroflexed views in the rectum revealed no abnormalities.    The time to cecum =  1) 2.50  minutes. The scope was then withdrawn in  1) 12.0  minutes from the cecum and the procedure completed. COMPLICATIONS:  None ENDOSCOPIC IMPRESSION: 1) Two polyps in the sigmoid colon 2) Otherwise normal examination RECOMMENDATIONS: 1) Given your significant family history of colon cancer, you should have a repeat colonoscopy in 5 years REPEAT EXAM:  No  ______________________________ Barbette Hair. Arlyce Dice, MD  CC:  Clifton Custard MD  n. Rosalie Doctor:   Barbette Hair. Calais Svehla at 10/30/2011 09:35 AM  Michaela Corner, [phone]

## 2011-10-30 NOTE — Progress Notes (Signed)
Patient did not experience any of the following events: a burn prior to discharge; a fall within the facility; wrong site/side/patient/procedure/implant event; or a hospital transfer or hospital admission upon discharge from the facility. (G8907) Patient did not have preoperative order for IV antibiotic SSI prophylaxis. (G8918)  

## 2011-10-30 NOTE — Patient Instructions (Signed)

## 2011-10-31 ENCOUNTER — Telehealth: Payer: Self-pay | Admitting: *Deleted

## 2011-10-31 NOTE — Telephone Encounter (Signed)
  Follow up Call-  Call back number 10/30/2011  Post procedure Call Back phone  # [phone]  EXT 5  Permission to leave phone message No  comments PT STATES ASK FOR HIM, NO MESSAGE     Patient questions:  Do you have a fever, pain , or abdominal swelling? no Pain Score  0 *  Have you tolerated food without any problems? yes  Have you been able to return to your normal activities? yes  Do you have any questions about your discharge instructions: Diet   no Medications  no Follow up visit  no  Do you have questions or concerns about your Care? no  Actions: * If pain score is 4 or above: No action needed, pain <4.

## 2011-11-06 ENCOUNTER — Encounter: Payer: Self-pay | Admitting: Gastroenterology

## 2012-04-18 ENCOUNTER — Telehealth: Payer: Self-pay | Admitting: Internal Medicine

## 2012-04-18 DIAGNOSIS — Z0000 Encounter for general adult medical examination without abnormal findings: Secondary | ICD-10-CM

## 2012-04-18 DIAGNOSIS — Z125 Encounter for screening for malignant neoplasm of prostate: Secondary | ICD-10-CM

## 2012-04-18 NOTE — Telephone Encounter (Signed)
Labs entered... 04/18/12@4 :32pm/LMB

## 2012-04-18 NOTE — Telephone Encounter (Signed)
Patient schedule for annual cpe on Sept 24, please order labs

## 2012-04-22 ENCOUNTER — Encounter: Payer: 59 | Admitting: Internal Medicine

## 2012-05-05 ENCOUNTER — Telehealth: Payer: Self-pay | Admitting: Internal Medicine

## 2012-05-05 MED ORDER — OMEPRAZOLE 40 MG PO CPDR: 40.0000 mg | DELAYED_RELEASE_CAPSULE | Freq: Every day | ORAL | Status: DC

## 2012-05-05 NOTE — Telephone Encounter (Signed)
Refill- omeprazole dr 40mg  capsule. Take one capsule by mouth daily. Qty 30 last fill 8.2.13

## 2012-05-05 NOTE — Telephone Encounter (Signed)
Rx done/SLS 

## 2012-05-13 LAB — BASIC METABOLIC PANEL
BUN: 22 mg/dL (ref 6–23)
Creat: 1.11 mg/dL (ref 0.50–1.35)
Potassium: 4.7 mEq/L (ref 3.5–5.3)

## 2012-05-13 LAB — HEPATIC FUNCTION PANEL
Alkaline Phosphatase: 77 U/L (ref 39–117)
Indirect Bilirubin: 0.5 mg/dL (ref 0.0–0.9)
Total Bilirubin: 0.6 mg/dL (ref 0.3–1.2)

## 2012-05-13 LAB — CBC WITH DIFFERENTIAL/PLATELET
Lymphocytes Relative: 23 % (ref 12–46)
Lymphs Abs: 1.2 10*3/uL (ref 0.7–4.0)
Neutro Abs: 3.3 10*3/uL (ref 1.7–7.7)
Neutrophils Relative %: 64 % (ref 43–77)
Platelets: 201 10*3/uL (ref 150–400)
RBC: 4.95 MIL/uL (ref 4.22–5.81)
WBC: 5.3 10*3/uL (ref 4.0–10.5)

## 2012-05-13 LAB — LIPID PANEL
Cholesterol: 166 mg/dL (ref 0–200)
Total CHOL/HDL Ratio: 4 Ratio
Triglycerides: 97 mg/dL (ref <150)
VLDL: 19 mg/dL (ref 0–40)

## 2012-05-13 LAB — TSH: TSH: 1.966 u[IU]/mL (ref 0.350–4.500)

## 2012-05-13 NOTE — Telephone Encounter (Signed)
Pt presented to the lab, future orders released and given to the lab. 

## 2012-05-13 NOTE — Addendum Note (Signed)
Addended by: Mervin Kung A on: 05/13/2012 08:32 AM   Modules accepted: Orders

## 2012-05-14 LAB — URINALYSIS, ROUTINE W REFLEX MICROSCOPIC
Bilirubin Urine: NEGATIVE
Ketones, ur: NEGATIVE mg/dL
Nitrite: NEGATIVE
Specific Gravity, Urine: 1.025 (ref 1.005–1.030)
pH: 6.5 (ref 5.0–8.0)

## 2012-05-20 ENCOUNTER — Encounter: Payer: Self-pay | Admitting: Internal Medicine

## 2012-05-20 ENCOUNTER — Telehealth: Payer: Self-pay | Admitting: Internal Medicine

## 2012-05-20 ENCOUNTER — Ambulatory Visit (INDEPENDENT_AMBULATORY_CARE_PROVIDER_SITE_OTHER): Payer: 59 | Admitting: Internal Medicine

## 2012-05-20 VITALS — BP 122/82 | HR 90 | Temp 98.2°F | Resp 16 | Ht 69.5 in | Wt 218.0 lb

## 2012-05-20 DIAGNOSIS — Z79899 Other long term (current) drug therapy: Secondary | ICD-10-CM

## 2012-05-20 DIAGNOSIS — E785 Hyperlipidemia, unspecified: Secondary | ICD-10-CM

## 2012-05-20 DIAGNOSIS — M25569 Pain in unspecified knee: Secondary | ICD-10-CM

## 2012-05-20 DIAGNOSIS — Z0000 Encounter for general adult medical examination without abnormal findings: Secondary | ICD-10-CM

## 2012-05-20 NOTE — Patient Instructions (Signed)
Please schedule fasting labs prior to next visit chem7-v58.69 and lipid/lft-272.4 

## 2012-05-25 DIAGNOSIS — Z0000 Encounter for general adult medical examination without abnormal findings: Secondary | ICD-10-CM | POA: Insufficient documentation

## 2012-05-25 DIAGNOSIS — M25569 Pain in unspecified knee: Secondary | ICD-10-CM | POA: Insufficient documentation

## 2012-05-25 NOTE — Assessment & Plan Note (Signed)
Reattempt Lipitor

## 2012-05-25 NOTE — Assessment & Plan Note (Signed)
Normal exam. Reviewed Labs. Up-to-date on colonoscopy

## 2012-05-25 NOTE — Assessment & Plan Note (Signed)
Suspect represents tendinitis. Discussed Voltaren course-patient defers.

## 2012-05-25 NOTE — Progress Notes (Signed)
  Subjective:    Patient ID: Ryan [name]., male    DOB: 04/30/1956, 56 y.o.   MRN: [id number]  HPI patient presents to clinic for annual exam. Complains of left knee pain and swelling without injury or instability. Has spontaneously improved approximately 70%. Stopped Lipitor though it is unclear if it was indeed causing arthralgias. Blood pressure viewed as normotensive. No other complaints  Past Medical History  Diagnosis Date  . Hypertension   . Hyperlipidemia   . Fatty liver     fatty liver disease  . Abnormal liver function     history of   . GERD (gastroesophageal reflux disease)   . Blind left eye     blind left eye since birth   Past Surgical History  Procedure Date  . Tonsillectomy     reports that he has been smoking Cigars.  He has never used smokeless tobacco. He reports that he drinks about 1.2 ounces of alcohol per week. He reports that he does not use illicit drugs. family history includes Colon cancer in his mother; Diabetes in his mother; Heart attack in his father; and Lung cancer in his father.  There is no history of Stomach cancer. No Known Allergies   Review of Systems see history of present illness     Objective:   Physical Exam  Physical Exam  Nursing note and vitals reviewed. Constitutional: He appears well-developed and well-nourished. No distress.  HENT:  Head: Normocephalic and atraumatic.  Right Ear: Tympanic membrane and external ear normal.  Left Ear: Tympanic membrane and external ear normal.  Nose: Nose normal.  Mouth/Throat: Uvula is midline, oropharynx is clear and moist and mucous membranes are normal. No oropharyngeal exudate.  Eyes: Conjunctivae and EOM are normal. Pupils are equal, round, and reactive to light. Right eye exhibits no discharge. Left eye exhibits no discharge. No scleral icterus.  Neck: Neck supple. Carotid bruit is not present. No thyromegaly present.  Cardiovascular: Normal rate, regular rhythm and normal heart  sounds.  Exam reveals no gallop and no friction rub.   No murmur heard. Pulmonary/Chest: Effort normal and breath sounds normal. No respiratory distress. He has no wheezes. He has no rales.  Abdominal: Soft. He exhibits no distension and no mass. There is no hepatosplenomegaly. There is no tenderness. There is no rebound. Hernia confirmed negative in the right inguinal area and confirmed negative in the left inguinal area.  Lymphadenopathy:    He has no cervical adenopathy.   musculoskeletal: Left knee-no erythema warmth or effusion. Gait normal. Full range of motion. Slight tenderness to palpation superior patella tendon  Neurological: He is alert.  Skin: Skin is warm and dry. He is not diaphoretic.  Psychiatric: He has a normal mood and affect.        Assessment & Plan:

## 2012-09-18 ENCOUNTER — Other Ambulatory Visit: Payer: Self-pay | Admitting: Internal Medicine

## 2012-10-01 ENCOUNTER — Encounter: Payer: Self-pay | Admitting: Internal Medicine

## 2012-11-04 ENCOUNTER — Telehealth: Payer: Self-pay | Admitting: Internal Medicine

## 2012-11-04 MED ORDER — LISINOPRIL-HYDROCHLOROTHIAZIDE 10-12.5 MG PO TABS: 1.0000 | ORAL_TABLET | Freq: Every day | ORAL | Status: DC

## 2012-11-04 NOTE — Telephone Encounter (Signed)
Refill- lisinopril hctz 10-12.5mg . Take one tablet by mouth daily. Qty 30 last fill 1.22.14

## 2012-11-10 LAB — HEPATIC FUNCTION PANEL
ALT: 22 U/L (ref 0–53)
Albumin: 4.5 g/dL (ref 3.5–5.2)
Indirect Bilirubin: 0.5 mg/dL (ref 0.0–0.9)
Total Protein: 7 g/dL (ref 6.0–8.3)

## 2012-11-10 LAB — BASIC METABOLIC PANEL
BUN: 24 mg/dL — ABNORMAL HIGH (ref 6–23)
Chloride: 104 mEq/L (ref 96–112)
Potassium: 5 mEq/L (ref 3.5–5.3)
Sodium: 139 mEq/L (ref 135–145)

## 2012-11-10 LAB — LIPID PANEL
Cholesterol: 148 mg/dL (ref 0–200)
HDL: 38 mg/dL — ABNORMAL LOW (ref >39)
LDL Cholesterol: 92 mg/dL (ref 0–99)
Total CHOL/HDL Ratio: 3.9 Ratio
Triglycerides: 90 mg/dL (ref <150)
VLDL: 18 mg/dL (ref 0–40)

## 2012-11-10 NOTE — Telephone Encounter (Signed)
Patient presented to lab 03.17.14/SLS

## 2012-11-18 ENCOUNTER — Encounter: Payer: Self-pay | Admitting: Family

## 2012-11-18 ENCOUNTER — Ambulatory Visit (INDEPENDENT_AMBULATORY_CARE_PROVIDER_SITE_OTHER): Payer: 59 | Admitting: Family

## 2012-11-18 VITALS — BP 126/86 | HR 78 | Temp 98.7°F | Resp 16 | Ht 69.5 in | Wt 216.0 lb

## 2012-11-18 DIAGNOSIS — F17200 Nicotine dependence, unspecified, uncomplicated: Secondary | ICD-10-CM

## 2012-11-18 DIAGNOSIS — I10 Essential (primary) hypertension: Secondary | ICD-10-CM

## 2012-11-18 DIAGNOSIS — I7300 Raynaud's syndrome without gangrene: Secondary | ICD-10-CM

## 2012-11-18 DIAGNOSIS — E785 Hyperlipidemia, unspecified: Secondary | ICD-10-CM

## 2012-11-18 MED ORDER — OMEPRAZOLE 40 MG PO CPDR: DELAYED_RELEASE_CAPSULE | ORAL | Status: DC

## 2012-11-18 MED ORDER — ATORVASTATIN CALCIUM 20 MG PO TABS: 20.0000 mg | ORAL_TABLET | Freq: Every day | ORAL | Status: DC

## 2012-11-18 NOTE — Progress Notes (Signed)
Subjective:    Patient ID: Ryan [name]., male    DOB: 09-25-1955, 57 y.o.   MRN: [id number]  HPI  Ryan [name] is a a 57 yr old male who presents today for follow up.  1) HTN- currently maintained on lisinopril-hctz. BMET stable last week.   2) Hyperlipidemia- last visit statin was resumed. Has some hand pain.  Stopped lipitor 1 week ago.    3) Numbness- Pt reports intermittent numbness and discoloration of right hand when it is cold. Starts in the right hand.   4) tobacco abuse- smokes 2 cigars a week.     Review of Systems See HPI  Past Medical History  Diagnosis Date  . Hypertension   . Hyperlipidemia   . Fatty liver     fatty liver disease  . Abnormal liver function     history of   . GERD (gastroesophageal reflux disease)   . Blind left eye     blind left eye since birth    History   Social History  . Marital Status: Married    Spouse Name: N/A    Number of Children: N/A  . Years of Education: N/A   Occupational History  . Not on file.   Social History Main Topics  . Smoking status: Current Some Day Smoker    Types: Cigars  . Smokeless tobacco: Never Used     Comment: pipe smoker off and on   . Alcohol Use: 1.2 oz/week    2 Glasses of wine per week  . Drug Use: No  . Sexually Active: Not on file     Comment: 3 CIGARS A WEEK   Other Topics Concern  . Not on file   Social History Narrative   Married 13 years (3rd)   1 daughter 2   Current Smoker - pipe smoker (on and off)   Alcohol use-yes (social - wine)    Occupation:  Curator          Past Surgical History  Procedure Laterality Date  . Tonsillectomy      Family History  Problem Relation Age of Onset  . Colon cancer Mother     deceased age 48  . Diabetes Mother   . Lung cancer Father     deceased age 33  . Heart attack Father     MI at age 66 and CABG  . Stomach cancer Neg Hx     No Known Allergies  Current Outpatient Prescriptions on File Prior to Visit  Medication  Sig Dispense Refill  . lisinopril-hydrochlorothiazide (PRINZIDE,ZESTORETIC) 10-12.5 MG per tablet Take 1 tablet by mouth daily.  30 tablet  4  . Omega-3 Fatty Acids (FISH OIL) 1000 MG CAPS Take 1,000 mg by mouth. Take 5 capsules by mouth once a day       . omeprazole (PRILOSEC) 40 MG capsule TAKE 1 CAPSULE BY MOUTH DAILY.  30 capsule  3  . atorvastatin (LIPITOR) 20 MG tablet Take 1 tablet (20 mg total) by mouth daily.  30 tablet  6   No current facility-administered medications on file prior to visit.    BP 126/86  Pulse 78  Temp(Src) 98.7 F (37.1 C) (Oral)  Resp 16  Ht 5' 9.5" (1.765 m)  Wt 216 lb (97.977 kg)  BMI 31.45 kg/m2  SpO2 99%       Objective:   Physical Exam  Constitutional: He is oriented to person, place, and time. He appears well-developed and well-nourished. No distress.  Cardiovascular: Normal rate and regular rhythm.   No murmur heard. Pulmonary/Chest: Effort normal and breath sounds normal. No respiratory distress. He has no wheezes. He has no rales. He exhibits no tenderness.  Neurological: He is alert and oriented to person, place, and time.  Psychiatric: He has a normal mood and affect. His behavior is normal. Judgment and thought content normal.          Assessment & Plan:

## 2012-11-18 NOTE — Patient Instructions (Addendum)
Please follow up in 3 months.  

## 2012-11-23 DIAGNOSIS — I7300 Raynaud's syndrome without gangrene: Secondary | ICD-10-CM | POA: Insufficient documentation

## 2012-11-23 NOTE — Assessment & Plan Note (Signed)
Stable lipids.  We discussed that exercise can bring up his HDL.  Cont statin. I do not think hand pain he described was secondary to statin.

## 2012-11-23 NOTE — Assessment & Plan Note (Signed)
Pt's report of "hand discoloration" when cold is most consistent with raynauds.  Monitor.

## 2012-11-23 NOTE — Assessment & Plan Note (Signed)
BP Readings from Last 3 Encounters:  11/18/12 126/86  05/20/12 122/82  10/30/11 133/73   BP is stable on lisinopril hctz. Continue same.

## 2012-11-23 NOTE — Assessment & Plan Note (Signed)
We discussed importance of complete cessation.

## 2013-02-17 ENCOUNTER — Ambulatory Visit (INDEPENDENT_AMBULATORY_CARE_PROVIDER_SITE_OTHER): Payer: 59 | Admitting: Family

## 2013-02-17 ENCOUNTER — Encounter: Payer: Self-pay | Admitting: Family

## 2013-02-17 VITALS — BP 124/80 | HR 74 | Temp 98.1°F | Resp 16 | Ht 69.5 in | Wt 214.0 lb

## 2013-02-17 DIAGNOSIS — I10 Essential (primary) hypertension: Secondary | ICD-10-CM

## 2013-02-17 DIAGNOSIS — E785 Hyperlipidemia, unspecified: Secondary | ICD-10-CM

## 2013-02-17 DIAGNOSIS — K219 Gastro-esophageal reflux disease without esophagitis: Secondary | ICD-10-CM

## 2013-02-17 DIAGNOSIS — F17200 Nicotine dependence, unspecified, uncomplicated: Secondary | ICD-10-CM

## 2013-02-17 DIAGNOSIS — I7300 Raynaud's syndrome without gangrene: Secondary | ICD-10-CM

## 2013-02-17 LAB — BASIC METABOLIC PANEL
Chloride: 103 mEq/L (ref 96–112)
Potassium: 4.3 mEq/L (ref 3.5–5.3)
Sodium: 138 mEq/L (ref 135–145)

## 2013-02-17 NOTE — Assessment & Plan Note (Signed)
BP Readings from Last 3 Encounters:  02/17/13 124/80  11/18/12 126/86  05/20/12 122/82   BP stable on zestoretic, continue same, obtain bmet.

## 2013-02-17 NOTE — Progress Notes (Signed)
Subjective:    Patient ID: Ryan Bowen., male    DOB: September 16, 1955, 57 y.o.   MRN: [id number]  HPI  Ryan [name] is a 57 yr old male who presents today for follow up.  1) HTN- He is currently maintained on lisinopril/hctz.    2) Hyperlipidemia- on statin. Last visit we discussed adding exercise to help bring up his HDL.    3) GERD- on PPI.  Reports that this is stable.    4) tobacco abuse- uses cigars 3-4 a week.     Review of Systems See HPI  Past Medical History  Diagnosis Date  . Hypertension   . Hyperlipidemia   . Fatty liver     fatty liver disease  . Abnormal liver function     history of   . GERD (gastroesophageal reflux disease)   . Blind left eye     blind left eye since birth    History   Social History  . Marital Status: Married    Spouse Name: N/A    Number of Children: N/A  . Years of Education: N/A   Occupational History  . Not on file.   Social History Main Topics  . Smoking status: Current Some Day Smoker    Types: Cigars  . Smokeless tobacco: Never Used     Comment: pipe smoker off and on   . Alcohol Use: 1.2 oz/week    2 Glasses of wine per week  . Drug Use: No  . Sexually Active: Not on file     Comment: 3 CIGARS A WEEK   Other Topics Concern  . Not on file   Social History Narrative   Married 13 years (3rd)   1 daughter 38   Current Smoker - pipe smoker (on and off)   Alcohol use-yes (social - wine)    Occupation:  Curator          Past Surgical History  Procedure Laterality Date  . Tonsillectomy      Family History  Problem Relation Age of Onset  . Colon cancer Mother     deceased age 50  . Diabetes Mother   . Lung cancer Father     deceased age 63  . Heart attack Father     MI at age 88 and CABG  . Stomach cancer Neg Hx     No Known Allergies  Current Outpatient Prescriptions on File Prior to Visit  Medication Sig Dispense Refill  . atorvastatin (LIPITOR) 20 MG tablet Take 1 tablet (20 mg total) by  mouth daily.  30 tablet  6  . lisinopril-hydrochlorothiazide (PRINZIDE,ZESTORETIC) 10-12.5 MG per tablet Take 1 tablet by mouth daily.  30 tablet  4  . Omega-3 Fatty Acids (FISH OIL) 1000 MG CAPS Take 1,000 mg by mouth. Take 5 capsules by mouth once a day       . omeprazole (PRILOSEC) 40 MG capsule TAKE 1 CAPSULE BY MOUTH DAILY.  30 capsule  5   No current facility-administered medications on file prior to visit.    BP 124/80  Pulse 74  Temp(Src) 98.1 F (36.7 C) (Oral)  Resp 16  Ht 5' 9.5" (1.765 m)  Wt 214 lb 0.6 oz (97.088 kg)  BMI 31.17 kg/m2  SpO2 97%       Objective:   Physical Exam  Constitutional: He appears well-developed and well-nourished. No distress.  Cardiovascular: Normal rate and regular rhythm.   No murmur heard. Pulmonary/Chest: Effort normal and breath sounds normal.  No respiratory distress. He has no wheezes. He has no rales. He exhibits no tenderness.  Musculoskeletal: He exhibits no edema.  Psychiatric: He has a normal mood and affect. His behavior is normal. Judgment and thought content normal.          Assessment & Plan:

## 2013-02-17 NOTE — Assessment & Plan Note (Signed)
Working on diet, continue statin, plan flp next visit.

## 2013-02-17 NOTE — Assessment & Plan Note (Signed)
Unchanged, we discussed importance of tobacco cessation.

## 2013-02-17 NOTE — Assessment & Plan Note (Signed)
Stable on PPI, continue same.  

## 2013-02-17 NOTE — Assessment & Plan Note (Signed)
Generally only bothers him during the winter.

## 2013-02-17 NOTE — Patient Instructions (Addendum)
Please complete your lab work prior to leaving. Please schedule a follow up appointment in 6 months. ,

## 2013-02-18 ENCOUNTER — Encounter: Payer: Self-pay | Admitting: Family

## 2013-02-19 ENCOUNTER — Telehealth: Payer: Self-pay | Admitting: *Deleted

## 2013-02-19 NOTE — Telephone Encounter (Signed)
Received call from pt's wife that pt's insurance is requiring him to use [facility] instead of local pharmacy. She states they are faxing request to [place] for: lisinopril/hct, prilosec and lipitor. Will await fax and send refills via escribe if fax is not received.

## 2013-02-23 NOTE — Telephone Encounter (Signed)
Signed.

## 2013-02-23 NOTE — Telephone Encounter (Signed)
Received fax from [facility] for refills requested below. Forms sent to Provider for signature.

## 2013-02-24 NOTE — Telephone Encounter (Signed)
Faxed to [phone] at 5:10pm on 02/23/13.

## 2013-07-07 ENCOUNTER — Ambulatory Visit (INDEPENDENT_AMBULATORY_CARE_PROVIDER_SITE_OTHER): Payer: 59 | Admitting: Internal Medicine

## 2013-07-07 ENCOUNTER — Encounter: Payer: Self-pay | Admitting: Internal Medicine

## 2013-07-07 VITALS — BP 151/70 | HR 87 | Temp 97.8°F | Wt 215.0 lb

## 2013-07-07 DIAGNOSIS — IMO0002 Reserved for concepts with insufficient information to code with codable children: Secondary | ICD-10-CM | POA: Insufficient documentation

## 2013-07-07 DIAGNOSIS — S239XXA Sprain of unspecified parts of thorax, initial encounter: Secondary | ICD-10-CM

## 2013-07-07 MED ORDER — PREDNISONE 10 MG PO TABS: ORAL_TABLET | ORAL | Status: DC

## 2013-07-07 MED ORDER — CYCLOBENZAPRINE HCL 10 MG PO TABS: 10.0000 mg | ORAL_TABLET | Freq: Every evening | ORAL | Status: DC | PRN

## 2013-07-07 NOTE — Progress Notes (Signed)
Pre visit review using our clinic review tool, if applicable. No additional management support is needed unless otherwise documented below in the visit note. 

## 2013-07-07 NOTE — Patient Instructions (Signed)
Take prednisone as prescribed Flexeril at bedtime, this is a muscle relaxant to take as needed for pain, will cause drowsiness Tylenol  500 mg OTC 2 tabs a day every 8 hours as needed for pain Heating pad twice a day Call if the pain is not gradually  improving in the next few days ; call anytime if you have severe symptoms, fever, difficulty with bladder or bowels   [facility] Website with information about home physical therapy for back pain: [email or web address]       Back Pain, Adult Low back pain is very common. About 1 in 5 people have back pain.The cause of low back pain is rarely dangerous. The pain often gets better over time.About half of people with a sudden onset of back pain feel better in just 2 weeks. About 8 in 10 people feel better by 6 weeks.  CAUSES Some common causes of back pain include:  Strain of the muscles or ligaments supporting the spine.  Wear and tear (degeneration) of the spinal discs.  Arthritis.  Direct injury to the back. DIAGNOSIS Most of the time, the direct cause of low back pain is not known.However, back pain can be treated effectively even when the exact cause of the pain is unknown.Answering your caregiver's questions about your overall health and symptoms is one of the most accurate ways to make sure the cause of your pain is not dangerous. If your caregiver needs more information, he or she may order lab work or imaging tests (X-rays or MRIs).However, even if imaging tests show changes in your back, this usually does not require surgery. HOME CARE INSTRUCTIONS For many people, back pain returns.Since low back pain is rarely dangerous, it is often a condition that people can learn to [facility] their own.   Remain active. It is stressful on the back to sit or stand in one place. Do not sit, drive, or stand in one place for more than 30 minutes at a time. Take short  walks on level surfaces as soon as pain allows.Try to increase the length of time you walk each day.  Do not stay in bed.Resting more than 1 or 2 days can delay your recovery.  Do not avoid exercise or work.Your body is made to move.It is not dangerous to be active, even though your back may hurt.Your back will likely heal faster if you return to being active before your pain is gone.  Pay attention to your body when you bend and lift. Many people have less discomfortwhen lifting if they bend their knees, keep the load close to their bodies,and avoid twisting. Often, the most comfortable positions are those that put less stress on your recovering back.  Find a comfortable position to sleep. Use a firm mattress and lie on your side with your knees slightly bent. If you lie on your back, put a pillow under your knees.  Only take over-the-counter or prescription medicines as directed by your caregiver. Over-the-counter medicines to reduce pain and inflammation are often the most helpful.Your caregiver may prescribe muscle relaxant drugs.These medicines help dull your pain so you can more quickly return to your normal activities and healthy exercise.  Put ice on the injured area.  Put ice in a plastic bag.  Place a towel between your skin and the bag.  Leave the ice on for 15-20 minutes, 03-04 times a day for the first 2 to 3 days. After that, ice and heat may be alternated to reduce pain and spasms.  Ask your caregiver about trying back exercises and gentle massage. This may be of some benefit.  Avoid feeling anxious or stressed.Stress increases muscle tension and can worsen back pain.It is important to recognize when you are anxious or stressed and learn ways to manage it.Exercise is a great option. SEEK MEDICAL CARE IF:  You have pain that is not relieved with rest or medicine.  You have pain that does not improve in 1 week.  You have new symptoms.  You are generally not  feeling well. SEEK IMMEDIATE MEDICAL CARE IF:   You have pain that radiates from your back into your legs.  You develop new bowel or bladder control problems.  You have unusual weakness or numbness in your arms or legs.  You develop nausea or vomiting.  You develop abdominal pain.  You feel faint. Document Released: 08/13/2005 Document Revised: 02/12/2012 Document Reviewed: 01/01/2011 [facility] Patient Information 2014 [place], [place].

## 2013-07-07 NOTE — Assessment & Plan Note (Addendum)
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 The pain is located at around T12, L1, some numbness at the L1 distribution. Plan is to treat conservatively, see instructions.  If not better he will need further eval. Excuse for 4 days provided, return to work next Monday. Declined a Rx for hydrocodone but asked him to call if   feels he needs a prescription

## 2013-07-07 NOTE — Progress Notes (Signed)
  Subjective:    Patient ID: Ryan [name]., male    DOB: 10-24-1955, 57 y.o.   MRN: [id number]  HPI Acute visit Yesterday he did some yard work and did a lot of torso twisting, last night he developed moderate to severe pain in the left side of the back at around T12 and L1. He took a leftover oxycodone which didn't help much. Denies any injury-fall perse, pain does not radiate but he had some numbness around the left hip and left lateral thigh. Pain decrease when he lays down in certain positions and sometimes when he walks and increased by sitting or standing.  Past Medical History  Diagnosis Date  . Hypertension   . Hyperlipidemia   . Fatty liver     fatty liver disease  . Abnormal liver function     history of   . GERD (gastroesophageal reflux disease)   . Blind left eye     blind left eye since birth   Past Surgical History  Procedure Laterality Date  . Tonsillectomy     History   Social History  . Marital Status: Married    Spouse Name: N/A    Number of Children: 1  . Years of Education: N/A   Occupational History  . Games developer    Social History Main Topics  . Smoking status: Current Some Day Smoker    Types: Cigars  . Smokeless tobacco: Never Used     Comment:  smoker off and on   . Alcohol Use: 1.2 oz/week    2 Glasses of wine per week     Comment: socially  . Drug Use: No  . Sexual Activity: Not on file     Comment: 3 CIGARS A WEEK   Other Topics Concern  . Not on file   Social History Narrative   Married 13 years (3rd)   1 daughter 60   Current Smoker - pipe smoker (on and off)   Alcohol use-yes (social - wine)    Occupation:  Curator          Review of Systems Denies any bladder or bowel incontinence, no rash. No dysuria, gross hematuria or abdominal pain No paresthesias of the feet or toes.     Objective:   Physical Exam  BP 151/70  Pulse 87  Temp(Src) 97.8 F (36.6 C)  Wt 215 lb (97.523 kg)  SpO2 98% General -- alert,  well-developed, NAD.  Abdomen-- Not distended, good bowel sounds,soft, non-tender. No rebound or rigidity.   Back-- Normal to inspection, no rash, and not tender to palpation at T-L area   Extremities-- no pretibial edema bilaterally  Neurologic--  alert & oriented X3. Speech normal, gait and posture antalgic, strength normal in all extremities. DTRs symmetric  Pinprick examination of the lower extremities normal.Straight leg test negative Psych-- Cognition and judgment appear intact. Cooperative with normal attention span and concentration. No anxious appearing , no depressed appearing.      Assessment & Plan:

## 2013-07-09 ENCOUNTER — Telehealth: Payer: Self-pay | Admitting: Internal Medicine

## 2013-07-09 ENCOUNTER — Telehealth: Payer: Self-pay

## 2013-07-09 MED ORDER — HYDROCODONE-ACETAMINOPHEN 7.5-325 MG PO TABS: 1.0000 | ORAL_TABLET | Freq: Three times a day (TID) | ORAL | Status: DC | PRN

## 2013-07-09 NOTE — Telephone Encounter (Signed)
Spouse presents to lobby to pick up Rx for Norco (on Dpr). Had questions on how to take other prescriptions with this medication. Advised to stop tylenol while taking the Norco. Take Norco as directed along with flexeril as long as not operating machinery or driving. Continue Prednisone as directed. Voiced understanding. If changes, please advise.

## 2013-07-09 NOTE — Telephone Encounter (Signed)
Called patient and let him know script is ready for pick up.

## 2013-07-09 NOTE — Telephone Encounter (Signed)
He told me he has left over for OxyContin, recommend not to use it I am prescribing hydrocodone instead, watch for drowsiness

## 2013-07-09 NOTE — Telephone Encounter (Signed)
Please advise 

## 2013-07-09 NOTE — Telephone Encounter (Signed)
Patient's wife is calling and states that that patient's pain is not improving. She wants to know if Dr. Drue Novel can increase pain medication dosage. Please advise.

## 2013-07-09 NOTE — Telephone Encounter (Signed)
Agree, thx! 

## 2013-07-15 ENCOUNTER — Encounter: Payer: Self-pay | Admitting: Family

## 2013-07-15 ENCOUNTER — Ambulatory Visit (INDEPENDENT_AMBULATORY_CARE_PROVIDER_SITE_OTHER): Payer: 59 | Admitting: Family

## 2013-07-15 VITALS — BP 120/80 | HR 92 | Temp 98.5°F | Resp 16 | Ht 69.5 in | Wt 218.0 lb

## 2013-07-15 DIAGNOSIS — M545 Low back pain, unspecified: Secondary | ICD-10-CM

## 2013-07-15 DIAGNOSIS — M79605 Pain in left leg: Secondary | ICD-10-CM

## 2013-07-15 DIAGNOSIS — Z23 Encounter for immunization: Secondary | ICD-10-CM

## 2013-07-15 MED ORDER — HYDROCODONE-ACETAMINOPHEN 7.5-325 MG PO TABS: 1.0000 | ORAL_TABLET | Freq: Three times a day (TID) | ORAL | Status: DC | PRN

## 2013-07-15 MED ORDER — METHYLPREDNISOLONE 4 MG PO KIT: PACK | ORAL | Status: DC

## 2013-07-15 MED ORDER — CYCLOBENZAPRINE HCL 10 MG PO TABS: 10.0000 mg | ORAL_TABLET | Freq: Every evening | ORAL | Status: DC | PRN

## 2013-07-15 NOTE — Progress Notes (Signed)
Subjective:    Patient ID: Ryan [name]., male    DOB: 1956/03/30, 57 y.o.   MRN: [id number]  HPI  Ryan [name] is a 57 yr old male who presents today with chief complaint of low back pain.  Reports symptoms started 9 days ago. Saw Dr. Drue Novel 1 week ago and was rx'd with pred taper and flexeril.  No improvement in pain . Did have numbness left upper thigh, now thigh feels on "fire." reports pain is better with standing and worse with sitting and bending left knee.  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   Review of Systems    see HPI  Past Medical History  Diagnosis Date  . Hypertension   . Hyperlipidemia   . Fatty liver     fatty liver disease  . Abnormal liver function     history of   . GERD (gastroesophageal reflux disease)   . Blind left eye     blind left eye since birth    History   Social History  . Marital Status: Married    Spouse Name: N/A    Number of Children: 1  . Years of Education: N/A   Occupational History  . Games developer    Social History Main Topics  . Smoking status: Current Some Day Smoker    Types: Cigars  . Smokeless tobacco: Never Used     Comment:  smoker off and on   . Alcohol Use: 1.2 oz/week    2 Glasses of wine per week     Comment: socially  . Drug Use: No  . Sexual Activity: Not on file     Comment: 3 CIGARS A WEEK   Other Topics Concern  . Not on file   Social History Narrative   Married 13 years (3rd)   1 daughter 88   Current Smoker - pipe smoker (on and off)   Alcohol use-yes (social - wine)    Occupation:  Curator          Past Surgical History  Procedure Laterality Date  . Tonsillectomy      Family History  Problem Relation Age of Onset  . Colon cancer Mother     deceased age 37  . Diabetes Mother   . Lung cancer Father     deceased age 38  . Heart attack Father     MI at age 91 and CABG  . Stomach cancer Neg Hx     No Known  Allergies  Current Outpatient Prescriptions on File Prior to Visit  Medication Sig Dispense Refill  . lisinopril-hydrochlorothiazide (PRINZIDE,ZESTORETIC) 10-12.5 MG per tablet Take 1 tablet by mouth daily.  30 tablet  4  . Omega-3 Fatty Acids (FISH OIL) 1000 MG CAPS Take 1,000 mg by mouth. Take 5 capsules by mouth once a day       . omeprazole (PRILOSEC) 40 MG capsule TAKE 1 CAPSULE BY MOUTH DAILY.  30 capsule  5   No current facility-administered medications on file prior to visit.    BP 120/80  Pulse 92  Temp(Src) 98.5 F (36.9 C) (Oral)  Resp 16  Ht 5' 9.5" (1.765 m)  Wt 218 lb 0.6 oz (98.902 kg)  BMI 31.75 kg/m2  SpO2 99%    Objective:   Physical Exam  Constitutional: He appears well-developed and well-nourished. No distress.  Cardiovascular: Normal rate and regular rhythm.   No  murmur heard. Pulmonary/Chest: Effort normal and breath sounds normal. No respiratory distress. He has no wheezes. He has no rales. He exhibits no tenderness.  Musculoskeletal:       Lumbar back: He exhibits tenderness.  Bilateral LE strength is 5/5   Neurological:  Reflex Scores:      Patellar reflexes are 3+ on the right side and 3+ on the left side.         Assessment & Plan:

## 2013-07-15 NOTE — Patient Instructions (Addendum)
You will be contacted about your MRI. Please call if symptoms worsen or if not improved in 1 week.  Do not drive after taking flexeril or vicodin.

## 2013-07-15 NOTE — Assessment & Plan Note (Signed)
Unchanged.  Repeat steroids with medrol dose pak, refill provided for vicodin  (pt advised this will be a short term rx), refill flexeril, refer for MRI of the lumbar spine.

## 2013-07-18 ENCOUNTER — Ambulatory Visit (HOSPITAL_BASED_OUTPATIENT_CLINIC_OR_DEPARTMENT_OTHER)
Admission: RE | Admit: 2013-07-18 | Discharge: 2013-07-18 | Disposition: A | Discharge status: Home / Self Care | Payer: 59 | Source: Ambulatory Visit | Attending: Family | Admitting: Family

## 2013-07-18 DIAGNOSIS — M79609 Pain in unspecified limb: Secondary | ICD-10-CM | POA: Insufficient documentation

## 2013-07-18 DIAGNOSIS — M51379 Other intervertebral disc degeneration, lumbosacral region without mention of lumbar back pain or lower extremity pain: Secondary | ICD-10-CM | POA: Insufficient documentation

## 2013-07-18 DIAGNOSIS — M545 Low back pain, unspecified: Secondary | ICD-10-CM

## 2013-07-18 DIAGNOSIS — M5137 Other intervertebral disc degeneration, lumbosacral region: Secondary | ICD-10-CM | POA: Insufficient documentation

## 2013-07-18 DIAGNOSIS — M48061 Spinal stenosis, lumbar region without neurogenic claudication: Secondary | ICD-10-CM | POA: Insufficient documentation

## 2013-07-18 DIAGNOSIS — M79605 Pain in left leg: Secondary | ICD-10-CM

## 2013-07-18 DIAGNOSIS — M47817 Spondylosis without myelopathy or radiculopathy, lumbosacral region: Secondary | ICD-10-CM | POA: Insufficient documentation

## 2013-07-20 ENCOUNTER — Telehealth: Payer: Self-pay | Admitting: Family

## 2013-07-20 DIAGNOSIS — M545 Low back pain, unspecified: Secondary | ICD-10-CM

## 2013-07-20 NOTE — Telephone Encounter (Signed)
Notified pt. He voices understanding and is agreeable to proceed with referral. 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 He wants to know what his work restrictions should be? States "he tried to take it easy today but he is paying for it tonight".  Please advise.

## 2013-07-20 NOTE — Telephone Encounter (Signed)
Please contact pt and let him know that his MRI shows degenerative discs, arthritis, and some compressed nerves in his lumbar spine.  I would like to refer him to neurosurgery for further evaluation.

## 2013-07-21 NOTE — Telephone Encounter (Signed)
I would recommend that he try to avoid doing heavy lifting (>20 pounds) if possible.  Bending ok.

## 2013-07-21 NOTE — Telephone Encounter (Signed)
Notified pt and placed letter at front desk.  He will pick it up tomorrow.

## 2013-08-10 ENCOUNTER — Ambulatory Visit: Payer: 59 | Admitting: Family

## 2014-02-28 ENCOUNTER — Other Ambulatory Visit: Payer: Self-pay | Admitting: Family

## 2014-03-01 NOTE — Telephone Encounter (Signed)
90 day supply of lisinopril-HCT sent to mail order pharmacy. Pt is past due for follow up of HTN.  Please call pt to arrange appt before next refill is due.

## 2014-03-01 NOTE — Telephone Encounter (Signed)
Left detailed message informing patient of medication refill and to call our office to schedule follow up.

## 2014-03-02 NOTE — Telephone Encounter (Signed)
Left message for patient to return my call.

## 2014-03-03 NOTE — Telephone Encounter (Signed)
Left message for patient to return my call.

## 2014-03-16 ENCOUNTER — Other Ambulatory Visit: Payer: Self-pay | Admitting: Family

## 2014-07-06 ENCOUNTER — Ambulatory Visit (INDEPENDENT_AMBULATORY_CARE_PROVIDER_SITE_OTHER): Payer: 59 | Admitting: Family

## 2014-07-06 ENCOUNTER — Ambulatory Visit (INDEPENDENT_AMBULATORY_CARE_PROVIDER_SITE_OTHER): Payer: 59 | Admitting: *Deleted

## 2014-07-06 ENCOUNTER — Encounter: Payer: Self-pay | Admitting: Family

## 2014-07-06 VITALS — BP 140/88 | HR 80 | Temp 98.0°F | Resp 16 | Ht 69.5 in | Wt 215.4 lb

## 2014-07-06 DIAGNOSIS — Z23 Encounter for immunization: Secondary | ICD-10-CM

## 2014-07-06 DIAGNOSIS — E785 Hyperlipidemia, unspecified: Secondary | ICD-10-CM

## 2014-07-06 DIAGNOSIS — I10 Essential (primary) hypertension: Secondary | ICD-10-CM

## 2014-07-06 DIAGNOSIS — Z0000 Encounter for general adult medical examination without abnormal findings: Secondary | ICD-10-CM

## 2014-07-06 LAB — BASIC METABOLIC PANEL
BUN: 19 mg/dL (ref 6–23)
CHLORIDE: 101 meq/L (ref 96–112)
CO2: 31 meq/L (ref 19–32)
Calcium: 9.8 mg/dL (ref 8.4–10.5)
Creatinine, Ser: 1.2 mg/dL (ref 0.4–1.5)
GFR: 69.12 mL/min (ref >60.00)
GLUCOSE: 95 mg/dL (ref 70–99)
Potassium: 5 mEq/L (ref 3.5–5.1)
SODIUM: 138 meq/L (ref 135–145)

## 2014-07-06 LAB — LIPID PANEL
Cholesterol: 261 mg/dL — ABNORMAL HIGH (ref 0–200)
HDL: 36.4 mg/dL — ABNORMAL LOW (ref >39.00)
LDL Cholesterol: 201 mg/dL — ABNORMAL HIGH (ref 0–99)
NonHDL: 224.6
Total CHOL/HDL Ratio: 7
Triglycerides: 119 mg/dL (ref 0.0–149.0)
VLDL: 23.8 mg/dL (ref 0.0–40.0)

## 2014-07-06 LAB — PSA: PSA: 1.54 ng/mL (ref 0.10–4.00)

## 2014-07-06 NOTE — Addendum Note (Signed)
Addended by: Harl Bowie on: 07/06/2014 11:38 AM   Modules accepted: Orders

## 2014-07-06 NOTE — Assessment & Plan Note (Signed)
Continue Omega 3.  Will obtain lipid panel today.

## 2014-07-06 NOTE — Assessment & Plan Note (Signed)
Stable on meds.  Continue same. Will obtain BMET today.

## 2014-07-06 NOTE — Patient Instructions (Signed)
Please complete lab work prior to leaving. Schedule complete physical in the next 3 months.

## 2014-07-06 NOTE — Progress Notes (Signed)
Subjective:    Patient ID: [name], male    DOB: Dec 28, 1955, 58 y.o.   MRN: [id number]  HPI Mr. Couser is a 58 year old male who presents today for follow up.  1. Hypertension- Currently on lisinopril-hctz.  Today's blood pressure is 140/88.  He has not taken his medicine today.    2. Hyperlipidemia- Currently on omega 3. He does not eat fast food. Follows a healthy diet.     Review of Systems  Constitutional: Negative for fever, chills and fatigue.  HENT: Negative for congestion, rhinorrhea and sore throat.   Eyes: Negative for pain and redness.  Respiratory: Negative for cough and shortness of breath.   Cardiovascular: Negative for chest pain, palpitations and leg swelling.  Gastrointestinal: Negative for abdominal pain, diarrhea and constipation.  Musculoskeletal: Negative for back pain and joint swelling.  Skin: Negative.   Neurological: Negative for dizziness, syncope and light-headedness.       Past Medical History  Diagnosis Date  . Hypertension   . Hyperlipidemia   . Fatty liver     fatty liver disease  . Abnormal liver function     history of   . GERD (gastroesophageal reflux disease)   . Blind left eye     blind left eye since birth    History   Social History  . Marital Status: Married    Spouse Name: N/A    Number of Children: 1  . Years of Education: N/A   Occupational History  . Engineer, building services    Social History Main Topics  . Smoking status: Current Some Day Smoker    Types: Cigars  . Smokeless tobacco: Never Used     Comment:  smoker off and on   . Alcohol Use: 1.2 oz/week    2 Glasses of wine per week     Comment: socially  . Drug Use: No  . Sexual Activity: Not on file     Comment: 3 CIGARS A WEEK   Other Topics Concern  . Not on file   Social History Narrative   Married 13 years (9rd)   1 daughter 64   Current Smoker - pipe smoker (on and off)   Alcohol use-yes (social - wine)    Occupation:  Dealer           Past Surgical History  Procedure Laterality Date  . Tonsillectomy      Family History  Problem Relation Age of Onset  . Colon cancer Mother     deceased age 65  . Diabetes Mother   . Lung cancer Father     deceased age 29  . Heart attack Father     MI at age 76 and CABG  . Stomach cancer Neg Hx     No Known Allergies  Current Outpatient Prescriptions on File Prior to Visit  Medication Sig Dispense Refill  . lisinopril-hydrochlorothiazide (PRINZIDE,ZESTORETIC) 10-12.5 MG per tablet TAKE 1 TABLET DAILY 90 tablet 0  . Omega-3 Fatty Acids (FISH OIL) 1000 MG CAPS Take 1,000 mg by mouth. Take 5 capsules by mouth once a day     . omeprazole (PRILOSEC) 40 MG capsule TAKE 1 CAPSULE DAILY 90 capsule 3   No current facility-administered medications on file prior to visit.    BP 140/88 mmHg  Pulse 80  Temp(Src) 98 F (36.7 C) (Oral)  Resp 16  Ht 5' 9.5" (1.765 m)  Wt 215 lb 6.4 oz (97.705 kg)  BMI 31.36 kg/m2  SpO2 99%    Objective:   Physical Exam  Constitutional: He is oriented to person, place, and time. He appears well-developed and well-nourished.  HENT:  Head: Normocephalic and atraumatic.  Eyes: Pupils are equal, round, and reactive to light.  Neck: Normal range of motion. Neck supple. No JVD present.  Cardiovascular: Normal rate, regular rhythm, normal heart sounds and intact distal pulses.   Pulmonary/Chest: Effort normal and breath sounds normal.  Musculoskeletal: Normal range of motion.  Neurological: He is alert and oriented to person, place, and time.  Skin: Skin is warm and dry.          Assessment & Plan:  Will obtain BMET, lipid panel, and PSA  Today (per pt request).  Will need a physical in 3 months.    I have personally seen and examined patient and agree with Jettie Booze NP student's assessment and plan.

## 2014-07-06 NOTE — Progress Notes (Signed)
Pre visit review using our clinic review tool, if applicable. No additional management support is needed unless otherwise documented below in the visit note. 

## 2014-07-07 ENCOUNTER — Telehealth: Payer: Self-pay | Admitting: Family

## 2014-07-07 NOTE — Telephone Encounter (Signed)
emmi mailed  °

## 2014-07-08 ENCOUNTER — Other Ambulatory Visit: Payer: Self-pay | Admitting: Family

## 2014-07-08 DIAGNOSIS — E785 Hyperlipidemia, unspecified: Secondary | ICD-10-CM

## 2014-07-08 NOTE — Telephone Encounter (Signed)
Please contact pt and let him know that his cholesterol is high and was much better on lipitor.  If he is agreeable, I would recommend that he restart lipitor, repeat Lipids in 6 weeks, dx hyperlipidemia.

## 2014-07-09 MED ORDER — ATORVASTATIN CALCIUM 20 MG PO TABS: 20.0000 mg | ORAL_TABLET | Freq: Every day | ORAL | Status: DC

## 2014-07-09 NOTE — Telephone Encounter (Signed)
Left detailed message on home # and to call and scheduled lab appt in 6 weeks or ask for me if he has questions or doesn't plan to restart medication. Future lab order entered.

## 2014-08-30 ENCOUNTER — Other Ambulatory Visit: Payer: Self-pay | Admitting: Family

## 2014-08-31 NOTE — Telephone Encounter (Signed)
Rx request to pharmacy/[facility]  

## 2014-10-04 ENCOUNTER — Telehealth: Payer: Self-pay | Admitting: *Deleted

## 2014-10-04 NOTE — Telephone Encounter (Signed)
He should fast for appointment.  If symptoms worsen (numbness/weakness of the LUE- he needs to go to the ED).  Otherwise ok to come in tomorrow AM.

## 2014-10-04 NOTE — Telephone Encounter (Signed)
Left detailed message on home # and to call if further questions.

## 2014-10-04 NOTE — Telephone Encounter (Signed)
(  Patient's wife called to see if his appointment would be fasting)  During phone conversation, she mentioned he is coming in for "odd feeling" in his left arm.  He states that it is not pain, it just feels strange.  It occurred last when he was moving wood and lasted for around 5 minutes.  Wife states that he did not have chest pain, but did experience some lightheadedness during this activity.  She states that it has occurred several times, but she does not know the details associated with events.  Patient has an appointment tomorrow (10/05/14) at 10:45.   Please advise if this is appropriate.

## 2014-10-05 ENCOUNTER — Encounter: Payer: Self-pay | Admitting: Family

## 2014-10-05 ENCOUNTER — Telehealth: Payer: Self-pay | Admitting: Family

## 2014-10-05 ENCOUNTER — Ambulatory Visit (INDEPENDENT_AMBULATORY_CARE_PROVIDER_SITE_OTHER): Payer: 59 | Admitting: Family

## 2014-10-05 VITALS — BP 130/90 | HR 81 | Temp 98.2°F | Resp 18 | Ht 69.5 in | Wt 217.5 lb

## 2014-10-05 DIAGNOSIS — R0789 Other chest pain: Secondary | ICD-10-CM | POA: Insufficient documentation

## 2014-10-05 DIAGNOSIS — I10 Essential (primary) hypertension: Secondary | ICD-10-CM

## 2014-10-05 DIAGNOSIS — R079 Chest pain, unspecified: Secondary | ICD-10-CM

## 2014-10-05 DIAGNOSIS — E785 Hyperlipidemia, unspecified: Secondary | ICD-10-CM

## 2014-10-05 DIAGNOSIS — M79602 Pain in left arm: Secondary | ICD-10-CM

## 2014-10-05 LAB — BASIC METABOLIC PANEL
BUN: 20 mg/dL (ref 6–23)
CO2: 33 mEq/L — ABNORMAL HIGH (ref 19–32)
Calcium: 10.3 mg/dL (ref 8.4–10.5)
Chloride: 103 mEq/L (ref 96–112)
Creatinine, Ser: 1.1 mg/dL (ref 0.40–1.50)
GFR: 73.42 mL/min (ref >60.00)
Glucose, Bld: 100 mg/dL — ABNORMAL HIGH (ref 70–99)
POTASSIUM: 4.8 meq/L (ref 3.5–5.1)
Sodium: 138 mEq/L (ref 135–145)

## 2014-10-05 LAB — LIPID PANEL
CHOL/HDL RATIO: 3
Cholesterol: 133 mg/dL (ref 0–200)
HDL: 44.6 mg/dL (ref >39.00)
LDL Cholesterol: 76 mg/dL (ref 0–99)
NONHDL: 88.4
Triglycerides: 64 mg/dL (ref 0.0–149.0)
VLDL: 12.8 mg/dL (ref 0.0–40.0)

## 2014-10-05 MED ORDER — ASPIRIN EC 81 MG PO TBEC: 81.0000 mg | DELAYED_RELEASE_TABLET | Freq: Every day | ORAL | Status: DC

## 2014-10-05 NOTE — Progress Notes (Signed)
Subjective:    Patient ID: Ryan [name]., male    DOB: [birth year], 57 y.o.   MRN: [id number]  HPI   Ryan [name] is here today because he reports discomfort/tingling in his left arm twice in the last week. Reports it may have begun in is left chest area and then radiated to arm one of these times. He was moving wood during one episode and cleaning floors with a floor scrubber the other time. It  lasted for around 2-3 seconds. He reports he had dizziness and feeling like he was going to pass out both times. He also reports feeling like his heart skips a beat occasionally- not associated with these 2 episodes. He occasionally smokes cigars.  1. HYPERTENSION: Reports compliance with lisinopril/hctz. Denies dyspnea, or lower extremity edema. BP Readings from Last 3 Encounters:  10/05/14 130/90  07/06/14 140/88  07/15/13 120/80   2. Hyperlipidemia: Reports compliance with atorvastatin since Christmas. Before that he was not taking it. Denies myalgia.  Diet: No fast food. Trying to avoid fats.  Exercise: Occasionally exercise on stationary bike.  Lab Results  Component Value Date   CHOL 261* 07/06/2014   HDL 36.40* 07/06/2014   LDLCALC 201* 07/06/2014   LDLDIRECT 144.3 11/17/2007   TRIG 119.0 07/06/2014   CHOLHDL 7 07/06/2014     Review of Systems    see HPI  Past Medical History  Diagnosis Date  . Hypertension   . Hyperlipidemia   . Fatty liver     fatty liver disease  . Abnormal liver function     history of   . GERD (gastroesophageal reflux disease)   . Blind left eye     blind left eye since birth    History   Social History  . Marital Status: Married    Spouse Name: N/A    Number of Children: 1  . Years of Education: N/A   Occupational History  . Engineer, building services    Social History Main Topics  . Smoking status: Current Some Day Smoker    Types: Cigars  . Smokeless tobacco: Never Used     Comment:  smoker off and on   . Alcohol Use: 1.2 oz/week   2 Glasses of wine per week     Comment: socially  . Drug Use: No  . Sexual Activity: Not on file     Comment: 3 CIGARS A WEEK   Other Topics Concern  . Not on file   Social History Narrative   Married 13 years (64rd)   1 daughter 69   Current Smoker - pipe smoker (on and off)   Alcohol use-yes (social - wine)    Occupation:  Dealer          Past Surgical History  Procedure Laterality Date  . Tonsillectomy      Family History  Problem Relation Age of Onset  . Colon cancer Mother     deceased age 57  . Diabetes Mother   . Lung cancer Father     deceased age 17  . Heart attack Father     MI at age 40 and CABG  . Stomach cancer Neg Hx     No Known Allergies  Current Outpatient Prescriptions on File Prior to Visit  Medication Sig Dispense Refill  . atorvastatin (LIPITOR) 20 MG tablet Take 1 tablet (20 mg total) by mouth daily. 30 tablet 1  . lisinopril-hydrochlorothiazide (PRINZIDE,ZESTORETIC) 10-12.5 MG per tablet TAKE 1 TABLET DAILY 90 tablet 0  .  Omega-3 Fatty Acids (FISH OIL) 1000 MG CAPS Take 1,000 mg by mouth. Take 5 capsules by mouth once a day     . omeprazole (PRILOSEC) 40 MG capsule TAKE 1 CAPSULE DAILY 90 capsule 3   No current facility-administered medications on file prior to visit.    BP 130/90 mmHg  Pulse 81  Temp(Src) 98.2 F (36.8 C) (Oral)  Resp 18  Ht 5' 9.5" (1.765 m)  Wt 217 lb 8 oz (98.657 kg)  BMI 31.67 kg/m2  SpO2 98%    Objective:   Physical Exam  Constitutional: He is oriented to person, place, and time. He appears well-developed and well-nourished. No distress.  HENT:  Head: Normocephalic and atraumatic.  Cardiovascular: Normal rate and regular rhythm.   No murmur heard. Pulmonary/Chest: Effort normal and breath sounds normal. No respiratory distress. He has no wheezes. He has no rales. He exhibits no tenderness.  Musculoskeletal: He exhibits no edema.  Neurological: He is alert and oriented to person, place, and time.    Psychiatric: He has a normal mood and affect. His behavior is normal. Judgment and thought content normal.          Assessment & Plan:

## 2014-10-05 NOTE — Telephone Encounter (Signed)
-----   Message from Synthia Innocent sent at 10/05/2014  3:17 PM EST ----- Insurance is requesting peer to peer review/case ID [id number]. Please call [phone] Option 3. Thanks

## 2014-10-05 NOTE — Progress Notes (Signed)
Pre visit review using our clinic review tool, if applicable. No additional management support is needed unless otherwise documented below in the visit note. 

## 2014-10-05 NOTE — Assessment & Plan Note (Signed)
Obtain flp, continue statin.

## 2014-10-05 NOTE — Telephone Encounter (Signed)
Insurance approved:  [id number]

## 2014-10-05 NOTE — Assessment & Plan Note (Addendum)
Chest pain without acute changes.  Multiple risk factors for CAD. Refer for myoview. Add asa 81mg  once daily for cardiac prevention. Has not smoked in 1 month. Advised pt to go to the ED if recurrent chest pain and he verbalizes understanding.

## 2014-10-05 NOTE — Assessment & Plan Note (Signed)
BP stable on current meds, continue same.  

## 2014-10-05 NOTE — Patient Instructions (Signed)
Please complete lab work prior to leaving. Add aspirin 81mg  once daily. Go to the ER if you develop recurrent arm/chest pain. Follow up in 3 months.

## 2014-10-07 ENCOUNTER — Encounter: Payer: Self-pay | Admitting: Cardiology

## 2014-10-07 ENCOUNTER — Encounter: Payer: Self-pay | Admitting: Family

## 2014-10-12 ENCOUNTER — Encounter: Payer: 59 | Admitting: Family

## 2014-10-13 ENCOUNTER — Encounter (HOSPITAL_COMMUNITY): Payer: 59

## 2014-10-19 ENCOUNTER — Ambulatory Visit (HOSPITAL_COMMUNITY): Payer: 59 | Attending: Cardiology | Admitting: Radiology

## 2014-10-19 DIAGNOSIS — R002 Palpitations: Secondary | ICD-10-CM | POA: Diagnosis not present

## 2014-10-19 DIAGNOSIS — I10 Essential (primary) hypertension: Secondary | ICD-10-CM | POA: Diagnosis not present

## 2014-10-19 DIAGNOSIS — R079 Chest pain, unspecified: Secondary | ICD-10-CM | POA: Insufficient documentation

## 2014-10-19 DIAGNOSIS — R42 Dizziness and giddiness: Secondary | ICD-10-CM | POA: Diagnosis not present

## 2014-10-19 MED ORDER — TECHNETIUM TC 99M SESTAMIBI GENERIC - CARDIOLITE
30.0000 | Freq: Once | INTRAVENOUS | Status: AC | PRN
  Administered 2014-10-19: 30 via INTRAVENOUS

## 2014-10-19 MED ORDER — TECHNETIUM TC 99M SESTAMIBI GENERIC - CARDIOLITE
10.0000 | Freq: Once | INTRAVENOUS | Status: AC | PRN
  Administered 2014-10-19: 10 via INTRAVENOUS

## 2014-10-19 NOTE — Progress Notes (Signed)
[facility] [address] [place], [place] [address] [phone]    Cardiology Nuclear Med Study  Ryan [name]. is a 59 y.o. male     MRN : [id number]     DOB: Feb 23, 1956  Procedure Date: 10/19/2014  Nuclear Med Background Indication for Stress Test:  Evaluation for Ischemia History:  09/23/01 MPI: EF: 52%, 01/11 Stress ECHO NL  Cardiac Risk Factors: Hypertension  Symptoms:  Chest Pain, Dizziness and Palpitations   Nuclear Pre-Procedure Caffeine/Decaff Intake:  None> 12 hrs NPO After: 7:30am   Lungs:  clear O2 Sat: 97% on room air. IV 0.9% NS with Angio Cath:  22g  IV Site: L Antecubital x 1, tolerated well IV Started by:  Irven Baltimore, RN  Chest Size (in):  44 Cup Size: n/a  Height: 5' 9.5" (1.765 m)  Weight:  212 lb (96.163 kg)  BMI:  Body mass index is 30.87 kg/(m^2). Tech Comments:  Patient took Prinizide this am. Irven Baltimore, RN.    Nuclear Med Study 1 or 2 day study: 1 day  Stress Test Type:  Carlton Adam  Reading MD: N/A  Order Authorizing Provider:  Debbrah Alar, NP, and Penni Homans, MD  Resting Radionuclide: Technetium 41m Sestamibi  Resting Radionuclide Dose: 11.0 mCi   Stress Radionuclide:  Technetium 36m Sestamibi  Stress Radionuclide Dose: 33.0 mCi           Stress Protocol Rest HR: 74 Stress HR: 93  Rest BP: 147/107 Stress BP: 146/106  Exercise Time (min): n/a METS: n/a   Predicted Max HR: 164 bpm % Max HR: 56.71 bpm Rate Pressure Product: 13578   Dose of Adenosine (mg):  n/a Dose of Lexiscan: 0.4 mg  Dose of Atropine (mg): n/a Dose of Dobutamine: n/a mcg/kg/min (at max HR)  Stress Test Technologist: Perrin Maltese, EMT-P  Nuclear Technologist:  Earl Many, CNMT     Rest Procedure:  Myocardial perfusion imaging was performed at rest 45 minutes following the intravenous administration of Technetium 39m Sestamibi. Rest ECG: NSR - Normal EKG  Stress Procedure:  The patient received IV Lexiscan 0.4 mg  over 15-seconds.  Technetium 70m Sestamibi injected at 30-seconds. This patient was lt. Headed with the Lexiscan injection. Quantitative spect images were obtained after a 45 minute delay. Stress ECG: No significant change from baseline ECG  QPS Raw Data Images:  Normal; no motion artifact; normal heart/lung ratio. Stress Images:  Normal homogeneous uptake in all areas of the myocardium. Rest Images:  Normal homogeneous uptake in all areas of the myocardium. Subtraction (SDS):  No evidence of ischemia. Transient Ischemic Dilatation (Normal <1.22):  1.09 Lung/Heart Ratio (Normal <0.45):  0.29  Quantitative Gated Spect Images QGS EDV:  109 ml QGS ESV:  54 ml  Impression Exercise Capacity:  Lexiscan with no exercise. BP Response:  Normal blood pressure response. Clinical Symptoms:  No significant symptoms noted. ECG Impression:  No significant ST segment change suggestive of ischemia. Comparison with Prior Nuclear Study: No significant change from previous study on 09/23/01  Overall Impression:  Normal stress nuclear study.  LV Ejection Fraction: 50%.  LV Wall Motion:  NL LV Function; NL Wall Motion.   Thayer Headings, Brooke Bonito., MD, [facility] 10/19/2014, 4:09 PM [address],  [place] Pager [phone]

## 2015-02-12 ENCOUNTER — Other Ambulatory Visit: Payer: Self-pay | Admitting: Family

## 2015-02-14 NOTE — Telephone Encounter (Signed)
Atorvastatin refill sent to pharmacy.  Pt was due for f/u in May and is past due.  Please call pt to arrange appt. Thanks!

## 2015-02-14 NOTE — Telephone Encounter (Signed)
Left detailed message informing patient of med refill and to call and schedule a follow up appointment

## 2015-02-23 ENCOUNTER — Other Ambulatory Visit: Payer: Self-pay | Admitting: Family

## 2015-03-04 ENCOUNTER — Encounter: Payer: Self-pay | Admitting: Gastroenterology

## 2015-03-07 ENCOUNTER — Ambulatory Visit (INDEPENDENT_AMBULATORY_CARE_PROVIDER_SITE_OTHER): Payer: 59 | Admitting: Family

## 2015-03-07 ENCOUNTER — Encounter: Payer: Self-pay | Admitting: Family

## 2015-03-07 VITALS — BP 112/80 | HR 87 | Temp 98.5°F | Resp 16 | Ht 69.5 in | Wt 216.2 lb

## 2015-03-07 DIAGNOSIS — J029 Acute pharyngitis, unspecified: Secondary | ICD-10-CM | POA: Diagnosis not present

## 2015-03-07 DIAGNOSIS — J069 Acute upper respiratory infection, unspecified: Secondary | ICD-10-CM

## 2015-03-07 LAB — POCT RAPID STREP A: RAPID STREP A SCREEN: NEGATIVE

## 2015-03-07 NOTE — Progress Notes (Signed)
Pre visit review using our clinic review tool, if applicable. No additional management support is needed unless otherwise documented below in the visit note. 

## 2015-03-07 NOTE — Patient Instructions (Signed)
Upper Respiratory Infection, Adult An upper respiratory infection (URI) is also sometimes known as the common cold. The upper respiratory tract includes the nose, sinuses, throat, trachea, and bronchi. Bronchi are the airways leading to the lungs. Most people improve within 1 week, but symptoms can last up to 2 weeks. A residual cough may last even longer.  CAUSES Many different viruses can infect the tissues lining the upper respiratory tract. The tissues become irritated and inflamed and often become very moist. Mucus production is also common. A cold is contagious. You can easily spread the virus to others by oral contact. This includes kissing, sharing a glass, coughing, or sneezing. Touching your mouth or nose and then touching a surface, which is then touched by another person, can also spread the virus. SYMPTOMS  Symptoms typically develop 1 to 3 days after you come in contact with a cold virus. Symptoms vary from person to person. They may include:  Runny nose.  Sneezing.  Nasal congestion.  Sinus irritation.  Sore throat.  Loss of voice (laryngitis).  Cough.  Fatigue.  Muscle aches.  Loss of appetite.  Headache.  Low-grade fever. DIAGNOSIS  You might diagnose your own cold based on familiar symptoms, since most people get a cold 2 to 3 times a year. Your caregiver can confirm this based on your exam. Most importantly, your caregiver can check that your symptoms are not due to another disease such as strep throat, sinusitis, pneumonia, asthma, or epiglottitis. Blood tests, throat tests, and X-rays are not necessary to diagnose a common cold, but they may sometimes be helpful in excluding other more serious diseases. Your caregiver will decide if any further tests are required. RISKS AND COMPLICATIONS  You may be at risk for a more severe case of the common cold if you smoke cigarettes, have chronic heart disease (such as heart failure) or lung disease (such as asthma), or if  you have a weakened immune system. The very young and very old are also at risk for more serious infections. Bacterial sinusitis, middle ear infections, and bacterial pneumonia can complicate the common cold. The common cold can worsen asthma and chronic obstructive pulmonary disease (COPD). Sometimes, these complications can require emergency medical care and may be life-threatening. PREVENTION  The best way to protect against getting a cold is to practice good hygiene. Avoid oral or hand contact with people with cold symptoms. Wash your hands often if contact occurs. There is no clear evidence that vitamin C, vitamin E, echinacea, or exercise reduces the chance of developing a cold. However, it is always recommended to get plenty of rest and practice good nutrition. TREATMENT  Treatment is directed at relieving symptoms. There is no cure. Antibiotics are not effective, because the infection is caused by a virus, not by bacteria. Treatment may include:  Increased fluid intake. Sports drinks offer valuable electrolytes, sugars, and fluids.  Breathing heated mist or steam (vaporizer or shower).  Eating chicken soup or other clear broths, and maintaining good nutrition.  Getting plenty of rest.  Using gargles or lozenges for comfort.  Controlling fevers with ibuprofen or acetaminophen as directed by your caregiver.  Increasing usage of your inhaler if you have asthma. Zinc gel and zinc lozenges, taken in the first 24 hours of the common cold, can shorten the duration and lessen the severity of symptoms. Pain medicines may help with fever, muscle aches, and throat pain. A variety of non-prescription medicines are available to treat congestion and runny nose. Your caregiver   can make recommendations and may suggest nasal or lung inhalers for other symptoms.  HOME CARE INSTRUCTIONS   Only take over-the-counter or prescription medicines for pain, discomfort, or fever as directed by your  caregiver.  Use a warm mist humidifier or inhale steam from a shower to increase air moisture. This may keep secretions moist and make it easier to breathe.  Drink enough water and fluids to keep your urine clear or pale yellow.  Rest as needed.  Return to work when your temperature has returned to normal or as your caregiver advises. You may need to stay home longer to avoid infecting others. You can also use a face mask and careful hand washing to prevent spread of the virus. SEEK MEDICAL CARE IF:   After the first few days, you feel you are getting worse rather than better.  You need your caregiver's advice about medicines to control symptoms.  You develop chills, worsening shortness of breath, or brown or red sputum. These may be signs of pneumonia.  You develop yellow or brown nasal discharge or pain in the face, especially when you bend forward. These may be signs of sinusitis.  You develop a fever, swollen neck glands, pain with swallowing, or white areas in the back of your throat. These may be signs of strep throat. SEEK IMMEDIATE MEDICAL CARE IF:   You have a fever.  You develop severe or persistent headache, ear pain, sinus pain, or chest pain.  You develop wheezing, a prolonged cough, cough up blood, or have a change in your usual mucus (if you have chronic lung disease).  You develop sore muscles or a stiff neck. Document Released: 02/06/2001 Document Revised: 11/05/2011 Document Reviewed: 11/18/2013 [employer] Patient Information 2015 [employer]. This information is not intended to replace advice given to you by your health care provider. Make sure you discuss any questions you have with your health care provider.  

## 2015-03-07 NOTE — Progress Notes (Signed)
Subjective:    Patient ID: Ryan [name]., male    DOB: June 10, 1956, 59 y.o.   MRN: [id number]  HPI  Ryan [name] is a 59 yr old male who presents today with chief complaint of cough. Reports that cough began 3 days ago.  Cough is a bit better today. Reports + soreness "both sides of his neck."     Review of Systems See HPI  Past Medical History  Diagnosis Date  . Hypertension   . Hyperlipidemia   . Fatty liver     fatty liver disease  . Abnormal liver function     history of   . GERD (gastroesophageal reflux disease)   . Blind left eye     blind left eye since birth    History   Social History  . Marital Status: Married    Spouse Name: N/A  . Number of Children: 1  . Years of Education: N/A   Occupational History  . Engineer, building services    Social History Main Topics  . Smoking status: Current Some Day Smoker    Types: Cigars  . Smokeless tobacco: Never Used     Comment:  smoker off and on   . Alcohol Use: 1.2 oz/week    2 Glasses of wine per week     Comment: socially  . Drug Use: No  . Sexual Activity: Not on file     Comment: 3 CIGARS A WEEK   Other Topics Concern  . Not on file   Social History Narrative   Married 13 years (22rd)   1 daughter 31   Current Smoker - pipe smoker (on and off)   Alcohol use-yes (social - wine)    Occupation:  Dealer          Past Surgical History  Procedure Laterality Date  . Tonsillectomy      Family History  Problem Relation Age of Onset  . Colon cancer Mother     deceased age 15  . Diabetes Mother   . Lung cancer Father     deceased age 32  . Heart attack Father     MI at age 23 and CABG  . Stomach cancer Neg Hx     No Known Allergies  Current Outpatient Prescriptions on File Prior to Visit  Medication Sig Dispense Refill  . atorvastatin (LIPITOR) 20 MG tablet TAKE 1 TABLET (20 MG TOTAL) BY MOUTH DAILY. 30 tablet 1  . lisinopril-hydrochlorothiazide (PRINZIDE,ZESTORETIC) 10-12.5 MG per tablet TAKE 1  TABLET DAILY 90 tablet 0  . Omega-3 Fatty Acids (FISH OIL) 1000 MG CAPS Take 1,000 mg by mouth. Take 5 capsules by mouth once a day     . omeprazole (PRILOSEC) 40 MG capsule TAKE 1 CAPSULE DAILY 90 capsule 3  . aspirin EC 81 MG tablet Take 1 tablet (81 mg total) by mouth daily. (Patient not taking: Reported on 03/07/2015)     No current facility-administered medications on file prior to visit.    BP 112/80 mmHg  Pulse 87  Temp(Src) 98.5 F (36.9 C) (Oral)  Resp 16  Ht 5' 9.5" (1.765 m)  Wt 216 lb 3.2 oz (98.068 kg)  BMI 31.48 kg/m2  SpO2 99%       Objective:   Physical Exam  Constitutional: He is oriented to person, place, and time. He appears well-developed and well-nourished. No distress.  HENT:  Head: Normocephalic and atraumatic.  Right Ear: Tympanic membrane and ear canal normal.  Left Ear: Tympanic  membrane and ear canal normal.  Mouth/Throat: Posterior oropharyngeal erythema present. No oropharyngeal exudate or posterior oropharyngeal edema.  Cardiovascular: Normal rate and regular rhythm.   No murmur heard. Pulmonary/Chest: Effort normal and breath sounds normal. No respiratory distress. He has no wheezes. He has no rales.  Musculoskeletal: He exhibits no edema.  Lymphadenopathy:    He has cervical adenopathy.  Neurological: He is alert and oriented to person, place, and time.  Skin: Skin is warm and dry.  Psychiatric: He has a normal mood and affect. His behavior is normal. Thought content normal.          Assessment & Plan:  URI- symptoms most consistent with URI. Rapid strep negative. Pt is instructed on supportive measures and to call if symptoms worsen or if symptoms are not improved in 3-4 days.

## 2015-04-12 ENCOUNTER — Telehealth: Payer: Self-pay | Admitting: Family

## 2015-04-12 NOTE — Telephone Encounter (Signed)
Left msg for pt to call and schedule fasting appt or fasting cpe (need within 30 days) as pt will need a med refill

## 2015-04-12 NOTE — Telephone Encounter (Signed)
30 day supply atorvastatin sent to pharmacy. Pt last seen 09/2014 and advised 3 month follow up.  Pt is past due and needs to be seen before further refills can be given.  Please call pt to arrange fasting appt soon.  Thanks!

## 2015-04-15 MED ORDER — OMEPRAZOLE 40 MG PO CPDR: 40.0000 mg | DELAYED_RELEASE_CAPSULE | Freq: Every day | ORAL | Status: DC

## 2015-04-15 NOTE — Telephone Encounter (Signed)
RX sent and notified pt.

## 2015-04-15 NOTE — Telephone Encounter (Signed)
Pt's wife Ivin Booty is calling in to get her husband a refill on his Omeprazole. Pt has an appt scheduled for 05/09/15. Informed pt's spouse that he needs to schedule his CPE . Is pt able to get a 30 day supply until his appt?   CB number: [phone]

## 2015-05-09 ENCOUNTER — Ambulatory Visit (INDEPENDENT_AMBULATORY_CARE_PROVIDER_SITE_OTHER): Payer: 59 | Admitting: Family

## 2015-05-09 ENCOUNTER — Encounter: Payer: Self-pay | Admitting: Family

## 2015-05-09 ENCOUNTER — Telehealth: Payer: Self-pay | Admitting: *Deleted

## 2015-05-09 VITALS — BP 138/78 | HR 87 | Temp 97.8°F | Resp 16 | Ht 69.5 in | Wt 219.4 lb

## 2015-05-09 DIAGNOSIS — I10 Essential (primary) hypertension: Secondary | ICD-10-CM | POA: Diagnosis not present

## 2015-05-09 DIAGNOSIS — Z23 Encounter for immunization: Secondary | ICD-10-CM | POA: Diagnosis not present

## 2015-05-09 DIAGNOSIS — K219 Gastro-esophageal reflux disease without esophagitis: Secondary | ICD-10-CM | POA: Diagnosis not present

## 2015-05-09 DIAGNOSIS — E785 Hyperlipidemia, unspecified: Secondary | ICD-10-CM | POA: Diagnosis not present

## 2015-05-09 DIAGNOSIS — Z0000 Encounter for general adult medical examination without abnormal findings: Secondary | ICD-10-CM

## 2015-05-09 NOTE — Progress Notes (Signed)
Subjective:    Patient ID: Ryan [name]., male    DOB: Feb 11, 1956, 59 y.o.   MRN: [id number]  HPI  Ryan [name] isa 59 yr old male who presents today for follow up.    HTN-  Currently on lisinopril-hctz 10/12.5, on 1/2 tab once daily.  BP Readings from Last 3 Encounters:  05/09/15 142/88  03/07/15 112/80  10/19/14 147/107   Hyperlipidemia- on lipitor.  Lab Results  Component Value Date   CHOL 133 10/05/2014   HDL 44.60 10/05/2014   LDLCALC 76 10/05/2014   LDLDIRECT 144.3 11/17/2007   TRIG 64.0 10/05/2014   CHOLHDL 3 10/05/2014   GERD-  He reports that he has gerd symptoms "even with the medicine."  He reports that he often feels like the pill "gets hung up on the right side of my throat."  Would like further evaluation.  Review of Systems See HPI  Past Medical History  Diagnosis Date  . Hypertension   . Hyperlipidemia   . Fatty liver     fatty liver disease  . Abnormal liver function     history of   . GERD (gastroesophageal reflux disease)   . Blind left eye     blind left eye since birth    Social History   Social History  . Marital Status: Married    Spouse Name: N/A  . Number of Children: 1  . Years of Education: N/A   Occupational History  . Engineer, building services    Social History Main Topics  . Smoking status: Current Some Day Smoker    Types: Cigars  . Smokeless tobacco: Never Used     Comment:  smoker off and on   . Alcohol Use: 1.2 oz/week    2 Glasses of wine per week     Comment: socially  . Drug Use: No  . Sexual Activity: Not on file     Comment: 3 CIGARS A WEEK   Other Topics Concern  . Not on file   Social History Narrative   Married 13 years (34rd)   1 daughter 60   Current Smoker - pipe smoker (on and off)   Alcohol use-yes (social - wine)    Occupation:  Dealer          Past Surgical History  Procedure Laterality Date  . Tonsillectomy      Family History  Problem Relation Age of Onset  . Colon cancer Mother    deceased age 33  . Diabetes Mother   . Lung cancer Father     deceased age 55  . Heart attack Father     MI at age 67 and CABG  . Stomach cancer Neg Hx     No Known Allergies  Current Outpatient Prescriptions on File Prior to Visit  Medication Sig Dispense Refill  . atorvastatin (LIPITOR) 20 MG tablet TAKE 1 TABLET (20 MG TOTAL) BY MOUTH DAILY. 30 tablet 0  . lisinopril-hydrochlorothiazide (PRINZIDE,ZESTORETIC) 10-12.5 MG per tablet TAKE 1 TABLET DAILY (Patient taking differently: Take 1/2 tablet daily) 90 tablet 0  . Omega-3 Fatty Acids (FISH OIL) 1000 MG CAPS Take 1,000 mg by mouth. Take 5 capsules by mouth once a day     . omeprazole (PRILOSEC) 40 MG capsule Take 1 capsule (40 mg total) by mouth daily. 14 capsule 0  . aspirin EC 81 MG tablet Take 1 tablet (81 mg total) by mouth daily. (Patient not taking: Reported on 05/09/2015)     No current  facility-administered medications on file prior to visit.    BP 142/88 mmHg  Pulse 87  Temp(Src) 97.8 F (36.6 C) (Oral)  Resp 16  Ht 5' 9.5" (1.765 m)  Wt 219 lb 6.4 oz (99.519 kg)  BMI 31.95 kg/m2  SpO2 99%       Objective:   Physical Exam  Constitutional: He is oriented to person, place, and time. He appears well-developed and well-nourished. No distress.  HENT:  Head: Normocephalic and atraumatic.  Cardiovascular: Normal rate and regular rhythm.   No murmur heard. Pulmonary/Chest: Effort normal and breath sounds normal. No respiratory distress. He has no wheezes. He has no rales.  Musculoskeletal: He exhibits no edema.  Neurological: He is alert and oriented to person, place, and time.  Skin: Skin is warm and dry.  Psychiatric: He has a normal mood and affect. His behavior is normal. Thought content normal.          Assessment & Plan:

## 2015-05-09 NOTE — Telephone Encounter (Signed)
Order added.

## 2015-05-09 NOTE — Telephone Encounter (Signed)
Pt wants to know if we will add PSA to current lab orders?  I advised him we usually do this at CPE but he is requesting that it be done with today's orders?  Please advise.

## 2015-05-09 NOTE — Assessment & Plan Note (Signed)
BP stable on current meds. Continue same, obtain bmet.  

## 2015-05-09 NOTE — Progress Notes (Signed)
Pre visit review using our clinic review tool, if applicable. No additional management support is needed unless otherwise documented below in the visit note. 

## 2015-05-09 NOTE — Assessment & Plan Note (Signed)
Obtain flp, continue statin. 

## 2015-05-09 NOTE — Patient Instructions (Addendum)
Please schedule lab work at the front desk.  You are due for a complete physical. Please schedule physical at the front desk.

## 2015-05-09 NOTE — Assessment & Plan Note (Signed)
Some dysphagia with pills, ongoing gerd sxs. Refer to GI.

## 2015-05-10 NOTE — Telephone Encounter (Signed)
Left detailed message on pt's voicemail re: PSA added to upcoming labs.

## 2015-05-17 ENCOUNTER — Other Ambulatory Visit (INDEPENDENT_AMBULATORY_CARE_PROVIDER_SITE_OTHER): Payer: 59

## 2015-05-17 ENCOUNTER — Encounter: Payer: Self-pay | Admitting: Family

## 2015-05-17 DIAGNOSIS — I10 Essential (primary) hypertension: Secondary | ICD-10-CM

## 2015-05-17 DIAGNOSIS — E785 Hyperlipidemia, unspecified: Secondary | ICD-10-CM

## 2015-05-17 LAB — BASIC METABOLIC PANEL
BUN: 19 mg/dL (ref 6–23)
CHLORIDE: 100 meq/L (ref 96–112)
CO2: 31 meq/L (ref 19–32)
CREATININE: 1.25 mg/dL (ref 0.40–1.50)
Calcium: 9.8 mg/dL (ref 8.4–10.5)
GFR: 63.21 mL/min (ref >60.00)
Glucose, Bld: 91 mg/dL (ref 70–99)
Potassium: 4 mEq/L (ref 3.5–5.1)
Sodium: 138 mEq/L (ref 135–145)

## 2015-05-17 LAB — LIPID PANEL
CHOL/HDL RATIO: 4
CHOLESTEROL: 136 mg/dL (ref 0–200)
HDL: 36.4 mg/dL — ABNORMAL LOW (ref >39.00)
LDL CALC: 75 mg/dL (ref 0–99)
NonHDL: 99.35
Triglycerides: 122 mg/dL (ref 0.0–149.0)
VLDL: 24.4 mg/dL (ref 0.0–40.0)

## 2015-06-01 ENCOUNTER — Encounter: Payer: Self-pay | Admitting: Physician Assistant

## 2015-06-01 ENCOUNTER — Ambulatory Visit (INDEPENDENT_AMBULATORY_CARE_PROVIDER_SITE_OTHER): Payer: 59 | Admitting: Physician Assistant

## 2015-06-01 VITALS — BP 134/80 | HR 60 | Ht 69.5 in | Wt 219.4 lb

## 2015-06-01 DIAGNOSIS — R1310 Dysphagia, unspecified: Secondary | ICD-10-CM | POA: Diagnosis not present

## 2015-06-01 DIAGNOSIS — K219 Gastro-esophageal reflux disease without esophagitis: Secondary | ICD-10-CM

## 2015-06-01 NOTE — Progress Notes (Signed)
Agree with assessment and plan. Will await EGD

## 2015-06-01 NOTE — Patient Instructions (Signed)
You have been scheduled for an endoscopy. Please follow written instructions given to you at your visit today. If you use inhalers (even only as needed), please bring them with you on the day of your procedure.  Continue the Prilosec 40 mg, 1 by mouth daily.

## 2015-06-01 NOTE — Progress Notes (Signed)
Patient ID: Ryan [name]., male   DOB: [birth year], 59 y.o.   MRN: [id number]   Subjective:    Patient ID: Ryan [name]., male    DOB: 1956-02-16, 59 y.o.   MRN: [id number]  HPI Ryan [name] is a pleasant 59 year old white male known to Dr. Deatra Ina from prior colonoscopy who comes in today with complaints of GERD and dysphagia to pills. He is referred by Ryan [name] nurse practitioner. Patient had undergone colonoscopy last in March 2013 with 2 polyps removed in path from these was consistent with benign polypoid colorectal mucosa. He does have family history of colon cancer in his mother and was recommended for 5 year interval follow-up. He has not had prior EGD. He says over the past 6 months he has noted dysphagia primarily to pills. He says they seem to be hanging or lodging more on the right side of his throat. He is not specifically having any noticeable problems with solid food or liquids. He does have a lot of reflux symptoms frequently having sour brash at night with fluid coming up in his mouth and waking him up. He also has intermittent coughing episodes at night. He is not having any heartburn generally during the day. He has been on omeprazole 40 mg by mouth every morning for several years. His appetite has  been fine his weight has been stable.  Review of Systems Pertinent positive and negative review of systems were noted in the above HPI section.  All other review of systems was otherwise negative.  Outpatient Encounter Prescriptions as of 06/01/2015  Medication Sig  . atorvastatin (LIPITOR) 20 MG tablet TAKE 1 TABLET (20 MG TOTAL) BY MOUTH DAILY.  Marland Kitchen lisinopril-hydrochlorothiazide (PRINZIDE,ZESTORETIC) 10-12.5 MG per tablet TAKE 1 TABLET DAILY (Patient taking differently: Take 1/2 tablet daily)  . Omega-3 Fatty Acids (FISH OIL) 1000 MG CAPS Take 1,000 mg by mouth. Take 5 capsules by mouth once a day   . omeprazole (PRILOSEC) 40 MG capsule Take 1 capsule (40 mg total) by mouth  daily.  . [DISCONTINUED] aspirin EC 81 MG tablet Take 1 tablet (81 mg total) by mouth daily. (Patient not taking: Reported on 05/09/2015)   No facility-administered encounter medications on file as of 06/01/2015.   No Known Allergies Patient Active Problem List   Diagnosis Date Noted  . Chest pain 10/05/2014  . Back sprain 07/07/2013  . Raynaud's disease /phenomenon 11/23/2012  . Annual physical exam 05/25/2012  . Family history of colon cancer 09/20/2011  . GERD (gastroesophageal reflux disease) 09/20/2011  . BACK PAIN, LUMBAR 10/24/2010  . TOBACCO ABUSE 08/09/2009  . Hyperlipidemia 11/25/2007  . Essential hypertension 11/25/2007  . FATTY LIVER DISEASE 11/25/2007  . LIVER FUNCTION TESTS, ABNORMAL, HX OF 11/25/2007   Social History   Social History  . Marital Status: Married    Spouse Name: N/A  . Number of Children: 1  . Years of Education: N/A   Occupational History  . Engineer, building services    Social History Main Topics  . Smoking status: Current Some Day Smoker    Types: Cigars  . Smokeless tobacco: Never Used     Comment:  smoker off and on   . Alcohol Use: 1.2 oz/week    2 Glasses of wine per week     Comment: socially  . Drug Use: No  . Sexual Activity: Not on file     Comment: 3 CIGARS A WEEK   Other Topics Concern  . Not on file  Social History Narrative   Married 13 years (74rd)   1 daughter 7   Current Smoker - pipe smoker (on and off)   Alcohol use-yes (social - wine)    Occupation:  Dealer          Mr. Stecklein family history includes Colon cancer in his mother; Diabetes in his mother; Heart attack in his father; Lung cancer in his father. There is no history of Stomach cancer.      Objective:    Filed Vitals:   06/01/15 0912  BP: 134/80  Pulse: 60    Physical Exam   well-developed white male in no acute distress, pleasant blood pressure 134/80 pulse 60 height 5 foot 9 weight 219. HEENT; nontraumatic normal cephalic EOMI PERRLA sclera  anicteric, Cardiovascular; regular rate and rhythm with S1-S2 no murmur or gallop, Pulmonary ;clear bilaterally, Abdomen ;large soft nontender nondistended bowel sounds are active is no palpable mass or hepatosplenomegaly, Rectal ;exam not done, Extremities ;no clubbing cyanosis or edema skin warm and dry, Neuropsych ;mood and affect appropriate       Assessment & Plan:   #1 59 yo male with chronic GERD,frequent sour brash sxs and pill dysphagia x 6 months. No solid food dysphagia. R/O peptic stricture, also screen for Barretts. #2 Family hx of Colon cancer - last colonoscopy 10/2011- no significant polyps- follow up in 2018  Plan; Continue Prilosec 40 mg po qam before breakfast  Reviewed antireflux regimen.  Schedule for EGD with possible dilation with Dr. Havery Moros.  Procedure discussed in detail with pt and he is agreeable to proceed. Plan follow up colonoscopy in 10/2016    Alfredia Ferguson PA-C 06/01/2015   Cc: Ryan Alar, NP

## 2015-06-14 ENCOUNTER — Encounter: Payer: Self-pay | Admitting: Family

## 2015-06-14 ENCOUNTER — Ambulatory Visit (INDEPENDENT_AMBULATORY_CARE_PROVIDER_SITE_OTHER): Payer: 59 | Admitting: Family

## 2015-06-14 VITALS — BP 133/81 | HR 77 | Temp 98.5°F | Ht 70.0 in | Wt 223.6 lb

## 2015-06-14 DIAGNOSIS — Z0000 Encounter for general adult medical examination without abnormal findings: Secondary | ICD-10-CM

## 2015-06-14 DIAGNOSIS — Z85828 Personal history of other malignant neoplasm of skin: Secondary | ICD-10-CM | POA: Diagnosis not present

## 2015-06-14 LAB — CBC WITH DIFFERENTIAL/PLATELET
BASOS PCT: 0.4 % (ref 0.0–3.0)
Basophils Absolute: 0 10*3/uL (ref 0.0–0.1)
EOS PCT: 2.7 % (ref 0.0–5.0)
Eosinophils Absolute: 0.2 10*3/uL (ref 0.0–0.7)
HCT: 45.5 % (ref 39.0–52.0)
Hemoglobin: 15.3 g/dL (ref 13.0–17.0)
LYMPHS ABS: 1.7 10*3/uL (ref 0.7–4.0)
Lymphocytes Relative: 24.6 % (ref 12.0–46.0)
MCHC: 33.7 g/dL (ref 30.0–36.0)
MCV: 88.8 fl (ref 78.0–100.0)
MONO ABS: 0.4 10*3/uL (ref 0.1–1.0)
Monocytes Relative: 6.3 % (ref 3.0–12.0)
NEUTROS PCT: 66 % (ref 43.0–77.0)
Neutro Abs: 4.4 10*3/uL (ref 1.4–7.7)
PLATELETS: 192 10*3/uL (ref 150.0–400.0)
RBC: 5.12 Mil/uL (ref 4.22–5.81)
RDW: 12.6 % (ref 11.5–15.5)
WBC: 6.7 10*3/uL (ref 4.0–10.5)

## 2015-06-14 LAB — URINALYSIS, ROUTINE W REFLEX MICROSCOPIC
BILIRUBIN URINE: NEGATIVE
Hgb urine dipstick: NEGATIVE
KETONES UR: NEGATIVE
Leukocytes, UA: NEGATIVE
NITRITE: NEGATIVE
RBC / HPF: NONE SEEN (ref 0–?)
SPECIFIC GRAVITY, URINE: 1.015 (ref 1.000–1.030)
Total Protein, Urine: NEGATIVE
Urine Glucose: NEGATIVE
Urobilinogen, UA: 0.2 (ref 0.0–1.0)
WBC UA: NONE SEEN (ref 0–?)
pH: 6.5 (ref 5.0–8.0)

## 2015-06-14 LAB — HEPATIC FUNCTION PANEL
ALT: 41 U/L (ref 0–53)
AST: 27 U/L (ref 0–37)
Albumin: 4.3 g/dL (ref 3.5–5.2)
Alkaline Phosphatase: 73 U/L (ref 39–117)
BILIRUBIN DIRECT: 0.1 mg/dL (ref 0.0–0.3)
BILIRUBIN TOTAL: 0.5 mg/dL (ref 0.2–1.2)
Total Protein: 7.3 g/dL (ref 6.0–8.3)

## 2015-06-14 LAB — PSA: PSA: 1.1 ng/mL (ref 0.10–4.00)

## 2015-06-14 LAB — TSH: TSH: 1.39 u[IU]/mL (ref 0.35–4.50)

## 2015-06-14 NOTE — Progress Notes (Signed)
Subjective:    Patient ID: Ryan [name]., male    DOB: Sep 24, 1955, 59 y.o.   MRN: [id number]  HPI  Patient presents today for complete physical.  Immunizations: flu shot up to date. Tdap 2013 Diet: reports diet is not great.  Not eating fast food.  Does not eat enough veggies.  Exercise: not exercising regularly.  Colonoscopy: 2013, due for follow up in 2018.    Review of Systems  Constitutional: Negative for unexpected weight change.       Wt Readings from Last 3 Encounters: 06/14/15 : 223 lb 9.6 oz (101.424 kg) 06/01/15 : 219 lb 6.4 oz (99.519 kg) 05/09/15 : 219 lb 6.4 oz (99.519 kg)    HENT: Negative for hearing loss and rhinorrhea.   Respiratory: Negative for cough and shortness of breath.   Cardiovascular: Negative for chest pain.  Gastrointestinal: Negative for diarrhea and constipation.  Genitourinary: Negative for dysuria and frequency.  Musculoskeletal: Negative for myalgias and arthralgias.  Skin: Negative for rash.  Neurological: Negative for headaches.  Hematological: Negative for adenopathy.  Psychiatric/Behavioral:       Denies depression/anxiety   Past Medical History  Diagnosis Date  . Hypertension   . Hyperlipidemia   . Fatty liver     fatty liver disease  . Abnormal liver function     history of   . GERD (gastroesophageal reflux disease)   . Blind left eye     blind left eye since birth    Social History   Social History  . Marital Status: Married    Spouse Name: N/A  . Number of Children: 1  . Years of Education: N/A   Occupational History  . Engineer, building services    Social History Main Topics  . Smoking status: Current Some Day Smoker    Types: Cigars  . Smokeless tobacco: Never Used     Comment:  smoker off and on   . Alcohol Use: 1.2 oz/week    2 Glasses of wine per week     Comment: socially  . Drug Use: No  . Sexual Activity: Not on file     Comment: 3 CIGARS A WEEK   Other Topics Concern  . Not on file   Social History  Narrative   Married 13 years (46rd)   1 daughter 50   Current Smoker - pipe smoker (on and off)   Alcohol use-yes (social - wine)    Occupation:  Dealer          Past Surgical History  Procedure Laterality Date  . Tonsillectomy      Family History  Problem Relation Age of Onset  . Colon cancer Mother     deceased age 32  . Diabetes Mother   . Lung cancer Father     deceased age 66  . Heart attack Father     MI at age 12 and CABG  . Stomach cancer Neg Hx     No Known Allergies  Current Outpatient Prescriptions on File Prior to Visit  Medication Sig Dispense Refill  . lisinopril-hydrochlorothiazide (PRINZIDE,ZESTORETIC) 10-12.5 MG per tablet TAKE 1 TABLET DAILY (Patient taking differently: Take 1/2 tablet daily) 90 tablet 0  . Omega-3 Fatty Acids (FISH OIL) 1000 MG CAPS Take 1,000 mg by mouth. Take 5 capsules by mouth once a day     . omeprazole (PRILOSEC) 40 MG capsule Take 1 capsule (40 mg total) by mouth daily. 14 capsule 0  . atorvastatin (LIPITOR) 20 MG tablet  TAKE 1 TABLET (20 MG TOTAL) BY MOUTH DAILY. (Patient not taking: Reported on 06/14/2015) 30 tablet 0   No current facility-administered medications on file prior to visit.    BP 133/81 mmHg  Pulse 77  Temp(Src) 98.5 F (36.9 C) (Oral)  Ht 5\' 10"  (1.778 m)  Wt 223 lb 9.6 oz (101.424 kg)  BMI 32.08 kg/m2  SpO2 97%       Objective:   Physical Exam  Physical Exam  Constitutional: He is oriented to person, place, and time. He appears well-developed and well-nourished. No distress.  HENT:  Head: Normocephalic and atraumatic.  Right Ear: Tympanic membrane and ear canal normal.  Left Ear: Tympanic membrane and ear canal normal.  Mouth/Throat: Oropharynx is clear and moist.  Eyes: Pupils are equal, round, and reactive to light. No scleral icterus.  Neck: Normal range of motion. No thyromegaly present.  Cardiovascular: Normal rate and regular rhythm.   No murmur heard. Pulmonary/Chest: Effort normal  and breath sounds normal. No respiratory distress. He has no wheezes. He has no rales. He exhibits no tenderness.  Abdominal: Soft. Bowel sounds are normal. He exhibits no distension and no mass. There is no tenderness. There is no rebound and no guarding.  Musculoskeletal: He exhibits no edema.  Lymphadenopathy:    He has no cervical adenopathy.  Neurological: He is alert and oriented to person, place, and time. He has normal patellar reflexes. He exhibits normal muscle tone. Coordination normal.  Skin: Skin is warm and dry.  Psychiatric: He has a normal mood and affect. His behavior is normal. Judgment and thought content normal.          Assessment & Plan:  Due to hx of basal cell carcinoma- will refer to derm for skin check.   Preventative- discussed healthy diet, exercise. Immunizations reviewed and up to date.      Assessment & Plan:

## 2015-06-14 NOTE — Progress Notes (Signed)
Pre visit review using our clinic review tool, if applicable. No additional management support is needed unless otherwise documented below in the visit note. 

## 2015-06-14 NOTE — Patient Instructions (Addendum)
Try to walk 30 minutes 5 days a week.  Complete lab work prior to leaving.

## 2015-06-15 ENCOUNTER — Encounter: Payer: Self-pay | Admitting: Family

## 2015-07-28 ENCOUNTER — Other Ambulatory Visit: Payer: Self-pay | Admitting: Family

## 2015-08-02 ENCOUNTER — Encounter: Payer: Self-pay | Admitting: Internal Medicine

## 2015-08-02 ENCOUNTER — Ambulatory Visit (AMBULATORY_SURGERY_CENTER): Payer: 59 | Admitting: Internal Medicine

## 2015-08-02 VITALS — BP 114/60 | HR 64 | Temp 96.8°F | Resp 23 | Ht 70.0 in | Wt 223.0 lb

## 2015-08-02 DIAGNOSIS — K219 Gastro-esophageal reflux disease without esophagitis: Secondary | ICD-10-CM

## 2015-08-02 DIAGNOSIS — R1310 Dysphagia, unspecified: Secondary | ICD-10-CM

## 2015-08-02 MED ORDER — SODIUM CHLORIDE 0.9 % IV SOLN: 500.0000 mL | INTRAVENOUS | Status: DC

## 2015-08-02 NOTE — Progress Notes (Signed)
Report to PACU, RN, vss, BBS= Clear.  

## 2015-08-02 NOTE — Patient Instructions (Signed)
YOU HAD AN ENDOSCOPIC PROCEDURE TODAY AT [facility]:   Refer to the procedure report that was given to you for any specific questions about what was found during the examination.  If the procedure report does not answer your questions, please call your gastroenterologist to clarify.  If you requested that your care partner not be given the details of your procedure findings, then the procedure report has been included in a sealed envelope for you to review at your convenience later.  YOU SHOULD EXPECT: Some feelings of bloating in the abdomen. Passage of more gas than usual.  Walking can help get rid of the air that was put into your GI tract during the procedure and reduce the bloating. If you had a lower endoscopy (such as a colonoscopy or flexible sigmoidoscopy) you may notice spotting of blood in your stool or on the toilet paper. If you underwent a bowel prep for your procedure, you may not have a normal bowel movement for a few days.  Please Note:  You might notice some irritation and congestion in your nose or some drainage.  This is from the oxygen used during your procedure.  There is no need for concern and it should clear up in a day or so.  SYMPTOMS TO REPORT IMMEDIATELY:    Following upper endoscopy (EGD)  Vomiting of blood or coffee ground material  New chest pain or pain under the shoulder blades  Painful or persistently difficult swallowing  New shortness of breath  Fever of 100F or higher  Black, tarry-looking stools  For urgent or emergent issues, a gastroenterologist can be reached at any hour by calling [phone].   DIET: Your first meal following the procedure should be a small meal and then it is ok to progress to your normal diet. Heavy or fried foods are harder to digest and may make you feel nauseous or bloated.  Likewise, meals heavy in dairy and vegetables can increase bloating.  Drink plenty of fluids but you should avoid alcoholic beverages  for 24 hours.  ACTIVITY:  You should plan to take it easy for the rest of today and you should NOT DRIVE or use heavy machinery until tomorrow (because of the sedation medicines used during the test).    FOLLOW UP: Our staff will call the number listed on your records the next business day following your procedure to check on you and address any questions or concerns that you may have regarding the information given to you following your procedure. If we do not reach you, we will leave a message.  However, if you are feeling well and you are not experiencing any problems, there is no need to return our call.  We will assume that you have returned to your regular daily activities without incident.  If any biopsies were taken you will be contacted by phone or by letter within the next 1-3 weeks.  Please call us at [phone] if you have not heard about the biopsies in 3 weeks.    SIGNATURES/CONFIDENTIALITY: You and/or your care partner have signed paperwork which will be entered into your electronic medical record.  These signatures attest to the fact that that the information above on your After Visit Summary has been reviewed and is understood.  Full responsibility of the confidentiality of this discharge information lies with you and/or your care-partner.  Anti reflux regimen information given. Continue current medications Referral to Vivia Budge, MD for evaluation of pharyngeal findings, take  your report with you.

## 2015-08-02 NOTE — Op Note (Signed)
[facility]  [address] [place], [id number]   ENDOSCOPY PROCEDURE REPORT  PATIENT: [name], Ryan [name]  MR#: [id number] BIRTHDATE: 12/11/1955 , 17  yrs. old GENDER: male ENDOSCOPIST: Eustace Quail, MD REFERRED BY:  Debbrah Alar, FNP PROCEDURE DATE:  08/02/2015 PROCEDURE:  EGD, diagnostic ASA CLASS:     Class II INDICATIONS:  dysphagia and history of esophageal reflux. MEDICATIONS: Monitored anesthesia care and Propofol 200 mg IV TOPICAL ANESTHETIC: none  DESCRIPTION OF PROCEDURE: After the risks benefits and alternatives of the procedure were thoroughly explained, informed consent was obtained.  The LB [id number] endoscope was introduced through the mouth and advanced to the second portion of the duodenum , Without limitations.  The instrument was slowly withdrawn as the mucosa was fully examined.  EXAM:There was a 10 mm papillomatous-appearing lesion attached to or adjacent to the epiglottis (see image).  The left arytenoid was somewhat erythematous with subtle nodularity.  The esophagus was normal.  No stricture.  Stomach revealed several tiny benign fundic gland type polyps but was otherwise normal.  The duodenum was normal.  Retroflexed views revealed no abnormalities.     The scope was then withdrawn from the patient and the procedure completed.  COMPLICATIONS: There were no immediate complications.  ENDOSCOPIC IMPRESSION: 1. Abnormalities in the region of the epiglottis and arytenoids as described and imaged 2. Otherwise, unremarkable EGD  RECOMMENDATIONS: 1.  Anti-reflux regimen to be followed 2.  Continue current medications 3. Please refer to ENT, Dr. Melissa Montane, for evaluation of pharyngeal findings  REPEAT EXAM:  eSigned:  Eustace Quail, MD 08/02/2015 10:14 AM    CC:The Patient and Debbrah Alar, N.  P.

## 2015-08-03 ENCOUNTER — Telehealth: Payer: Self-pay | Admitting: *Deleted

## 2015-08-03 ENCOUNTER — Telehealth: Payer: Self-pay

## 2015-08-03 NOTE — Telephone Encounter (Signed)
Pt scheduled to see Dr. Janace Hoard ENT 08/09/15@2 :20pm. Pt to arrive there at 2pm. Called pt with appt and pt states he has another procedure that day. Pt given the phone number of the office to call and reschedule the appt to a date that works for him. Pt verbalized understanding.

## 2015-08-03 NOTE — Telephone Encounter (Signed)
  Follow up Call-  Call back number 08/02/2015  Post procedure Call Back phone  # [phone]  Permission to leave phone message Yes     Patient questions:  Do you have a fever, pain , or abdominal swelling? No. Pain Score  0 *  Have you tolerated food without any problems? Yes.    Have you been able to return to your normal activities? Yes.    Do you have any questions about your discharge instructions: Diet   No. Medications  No. Follow up visit  No.  Do you have questions or concerns about your Care? No.  Actions: * If pain score is 4 or above: No action needed, pain <4.

## 2015-08-16 ENCOUNTER — Other Ambulatory Visit: Payer: Self-pay | Admitting: Otolaryngology

## 2015-10-12 ENCOUNTER — Telehealth: Payer: Self-pay | Admitting: *Deleted

## 2015-10-12 NOTE — Telephone Encounter (Signed)
Received request for Medical Records; forwarded to [place] for Email & scan/SLS 02/15

## 2015-10-26 ENCOUNTER — Telehealth: Payer: Self-pay | Admitting: *Deleted

## 2015-10-26 NOTE — Telephone Encounter (Signed)
Received fax requesting all Medical Records; forwarded to [place] for email/scan//SLS 03/01

## 2015-11-08 ENCOUNTER — Telehealth: Payer: Self-pay | Admitting: *Deleted

## 2015-11-08 NOTE — Telephone Encounter (Signed)
Received request for Medical Records c/o [facility] Retriev[facility]; forwarded to [place] for email/scan//SLS 03/14

## 2015-12-02 ENCOUNTER — Other Ambulatory Visit: Payer: Self-pay | Admitting: Family

## 2015-12-22 ENCOUNTER — Other Ambulatory Visit: Payer: Self-pay | Admitting: Family

## 2016-05-02 ENCOUNTER — Other Ambulatory Visit: Payer: Self-pay | Admitting: Family

## 2016-05-14 ENCOUNTER — Ambulatory Visit (INDEPENDENT_AMBULATORY_CARE_PROVIDER_SITE_OTHER): Payer: 59 | Admitting: Family

## 2016-05-14 ENCOUNTER — Encounter: Payer: Self-pay | Admitting: Family

## 2016-05-14 VITALS — BP 141/71 | HR 71 | Temp 98.6°F | Resp 16 | Ht 70.0 in | Wt 216.4 lb

## 2016-05-14 DIAGNOSIS — H9202 Otalgia, left ear: Secondary | ICD-10-CM

## 2016-05-14 DIAGNOSIS — R319 Hematuria, unspecified: Secondary | ICD-10-CM

## 2016-05-14 DIAGNOSIS — E785 Hyperlipidemia, unspecified: Secondary | ICD-10-CM | POA: Diagnosis not present

## 2016-05-14 DIAGNOSIS — Z23 Encounter for immunization: Secondary | ICD-10-CM

## 2016-05-14 DIAGNOSIS — I10 Essential (primary) hypertension: Secondary | ICD-10-CM | POA: Diagnosis not present

## 2016-05-14 NOTE — Assessment & Plan Note (Signed)
Stable on current medications. Continue same, obtain follow up bmet.  

## 2016-05-14 NOTE — Patient Instructions (Signed)
Please complete lab work prior to leaving. Let me know if you have recurrent symptoms of dizziness.

## 2016-05-14 NOTE — Progress Notes (Signed)
Subjective:    Patient ID: Ryan Pandy., male    DOB: 12-Apr-1956, 60 y.o.   MRN: [id number]  HPI  Ryan [name] is a 60 yr old male who presents today for follow up.  1) HTN-  Currently maintained on lisinopril-hctz.  Denies  Cp/sob or swelling.  BP Readings from Last 3 Encounters:  05/14/16 (!) 141/71  08/02/15 114/60  06/14/15 133/81   2) Hyperlipidemia- on fish oil.  Lab Results  Component Value Date   CHOL 136 05/17/2015   HDL 36.40 (L) 05/17/2015   LDLCALC 75 05/17/2015   LDLDIRECT 144.3 11/17/2007   TRIG 122.0 05/17/2015   CHOLHDL 4 05/17/2015    Has some left ear pain.   Had episode of hematuria last week x 1.  He did have some associated low back pain at that time. He reports that he did take a bunch of aspirin that week.    Review of Systems  Neurological: Positive for dizziness.   See HPI  Past Medical History:  Diagnosis Date  . Abnormal liver function    history of   . Blind left eye    blind left eye since birth  . Fatty liver    fatty liver disease  . GERD (gastroesophageal reflux disease)   . Hyperlipidemia   . Hypertension      Social History   Social History  . Marital status: Married    Spouse name: N/A  . Number of children: 1  . 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 Smoking status: Former Smoker    Types: Cigars    Quit date: 11/26/2014  . Smokeless tobacco: Never Used     Comment:  smoker off and on   . Alcohol use 1.2 oz/week    2 Glasses of wine per week     Comment: socially  . Drug use: No  . Sexual activity: Not on file     Comment: 3 CIGARS A WEEK   Other Topics Concern  . Not on file   Social History Narrative   Married 13 years (29rd)   1 daughter 25   Current Smoker - pipe smoker (on and off)   Alcohol use-yes (social - wine)    Occupation:  Dealer          Past Surgical History:  Procedure Laterality Date  .  TONSILLECTOMY      Family History  Problem Relation Age of Onset  . Colon cancer Mother     deceased age 46  . Diabetes Mother   . Lung cancer Father     deceased age 57  . Heart attack Father     MI at age 53 and CABG  . Stomach cancer Neg Hx     Allergies  Allergen Reactions  . Atorvastatin Other (See Comments)    Hands swelled    Current Outpatient Prescriptions on File Prior to Visit  Medication Sig Dispense Refill  . lisinopril-hydrochlorothiazide (PRINZIDE,ZESTORETIC) 10-12.5 MG tablet TAKE 1 TABLET DAILY 90 tablet 1  . Omega-3 Fatty Acids (FISH OIL) 1000 MG CAPS Take 1,000 mg by mouth. Take 4 capsules by mouth once a day    . omeprazole (PRILOSEC) 40 MG capsule TAKE 1 CAPSULE DAILY 30 capsule 0   No current facility-administered medications on file prior to visit.     BP (!) 141/71 (BP Location: Right Arm, Cuff Size: Large)  Pulse 71   Temp 98.6 F (37 C) (Oral)   Resp 16   Ht 5\' 10"  (1.778 m)   Wt 216 lb 6.4 oz (98.2 kg)   SpO2 98% Comment: room air  BMI 31.05 kg/m       Objective:   Physical Exam  Constitutional: He is oriented to person, place, and time. He appears well-developed and well-nourished. No distress.  HENT:  Head: Normocephalic and atraumatic.  Right Ear: Tympanic membrane and ear canal normal.  Left Ear: Tympanic membrane and ear canal normal.  Mouth/Throat: No oropharyngeal exudate, posterior oropharyngeal edema or posterior oropharyngeal erythema.  Cardiovascular: Normal rate and regular rhythm.   No murmur heard. Pulmonary/Chest: Effort normal and breath sounds normal. No respiratory distress. He has no wheezes. He has no rales.  Musculoskeletal: He exhibits no edema.  Neurological: He is alert and oriented to person, place, and time.  Skin: Skin is warm and dry.  Psychiatric: He has a normal mood and affect. His behavior is normal. Thought content normal.          Assessment & Plan:  Otalgia- normal ear exam. Had one episode  of dizziness last week which resolved and has not returned. Advised pt to let me know if recurrent symptoms.   Hematuria- will obtain UA and culture. If still positive for blood, consider referral to urology.

## 2016-05-14 NOTE — Assessment & Plan Note (Signed)
Obtain follow up lipid panel. Continue fish oil.

## 2016-05-14 NOTE — Progress Notes (Signed)
Pre visit review using our clinic review tool, if applicable. No additional management support is needed unless otherwise documented below in the visit note. 

## 2016-05-15 LAB — LIPID PANEL
CHOLESTEROL: 192 mg/dL (ref 0–200)
HDL: 34.3 mg/dL — AB (ref >39.00)
LDL Cholesterol: 137 mg/dL — ABNORMAL HIGH (ref 0–99)
NonHDL: 157.9
Total CHOL/HDL Ratio: 6
Triglycerides: 105 mg/dL (ref 0.0–149.0)
VLDL: 21 mg/dL (ref 0.0–40.0)

## 2016-05-15 LAB — URINALYSIS, ROUTINE W REFLEX MICROSCOPIC
Bilirubin Urine: NEGATIVE
Ketones, ur: NEGATIVE
Leukocytes, UA: NEGATIVE
Nitrite: NEGATIVE
Total Protein, Urine: NEGATIVE
Urine Glucose: NEGATIVE
Urobilinogen, UA: 0.2 (ref 0.0–1.0)
WBC UA: NONE SEEN (ref 0–?)
pH: 5.5 (ref 5.0–8.0)

## 2016-05-15 LAB — BASIC METABOLIC PANEL
BUN: 24 mg/dL — ABNORMAL HIGH (ref 6–23)
CALCIUM: 9.2 mg/dL (ref 8.4–10.5)
CO2: 31 meq/L (ref 19–32)
CREATININE: 1.11 mg/dL (ref 0.40–1.50)
Chloride: 98 mEq/L (ref 96–112)
GFR: 72.25 mL/min (ref >60.00)
GLUCOSE: 116 mg/dL — AB (ref 70–99)
Potassium: 4.2 mEq/L (ref 3.5–5.1)
Sodium: 134 mEq/L — ABNORMAL LOW (ref 135–145)

## 2016-05-15 LAB — URINE CULTURE: ORGANISM ID, BACTERIA: NO GROWTH

## 2016-05-16 ENCOUNTER — Encounter: Payer: Self-pay | Admitting: Family

## 2016-08-13 ENCOUNTER — Ambulatory Visit (INDEPENDENT_AMBULATORY_CARE_PROVIDER_SITE_OTHER): Payer: 59 | Admitting: Family

## 2016-08-13 ENCOUNTER — Encounter: Payer: Self-pay | Admitting: Family

## 2016-08-13 VITALS — BP 134/87 | HR 71 | Temp 98.2°F | Resp 16 | Ht 70.0 in | Wt 212.6 lb

## 2016-08-13 DIAGNOSIS — R0789 Other chest pain: Secondary | ICD-10-CM

## 2016-08-13 DIAGNOSIS — Z0000 Encounter for general adult medical examination without abnormal findings: Secondary | ICD-10-CM | POA: Diagnosis not present

## 2016-08-13 LAB — CBC WITH DIFFERENTIAL/PLATELET
BASOS ABS: 0 10*3/uL (ref 0.0–0.1)
BASOS PCT: 0.5 % (ref 0.0–3.0)
EOS ABS: 0.2 10*3/uL (ref 0.0–0.7)
Eosinophils Relative: 3.4 % (ref 0.0–5.0)
HCT: 43.2 % (ref 39.0–52.0)
Hemoglobin: 14.5 g/dL (ref 13.0–17.0)
LYMPHS ABS: 1.5 10*3/uL (ref 0.7–4.0)
Lymphocytes Relative: 27 % (ref 12.0–46.0)
MCHC: 33.5 g/dL (ref 30.0–36.0)
MCV: 88.5 fl (ref 78.0–100.0)
MONO ABS: 0.3 10*3/uL (ref 0.1–1.0)
Monocytes Relative: 5.9 % (ref 3.0–12.0)
NEUTROS ABS: 3.5 10*3/uL (ref 1.4–7.7)
NEUTROS PCT: 63.2 % (ref 43.0–77.0)
PLATELETS: 192 10*3/uL (ref 150.0–400.0)
RBC: 4.88 Mil/uL (ref 4.22–5.81)
RDW: 12.3 % (ref 11.5–15.5)
WBC: 5.6 10*3/uL (ref 4.0–10.5)

## 2016-08-13 LAB — URINALYSIS, ROUTINE W REFLEX MICROSCOPIC
Bilirubin Urine: NEGATIVE
Hgb urine dipstick: NEGATIVE
KETONES UR: NEGATIVE
Leukocytes, UA: NEGATIVE
NITRITE: NEGATIVE
PH: 6 (ref 5.0–8.0)
RBC / HPF: NONE SEEN (ref 0–?)
SPECIFIC GRAVITY, URINE: 1.025 (ref 1.000–1.030)
Total Protein, Urine: NEGATIVE
Urine Glucose: NEGATIVE
Urobilinogen, UA: 0.2 (ref 0.0–1.0)
WBC UA: NONE SEEN (ref 0–?)

## 2016-08-13 LAB — LIPID PANEL
CHOL/HDL RATIO: 5
Cholesterol: 187 mg/dL (ref 0–200)
HDL: 39.2 mg/dL (ref >39.00)
LDL Cholesterol: 133 mg/dL — ABNORMAL HIGH (ref 0–99)
NONHDL: 148.02
TRIGLYCERIDES: 77 mg/dL (ref 0.0–149.0)
VLDL: 15.4 mg/dL (ref 0.0–40.0)

## 2016-08-13 LAB — BASIC METABOLIC PANEL
BUN: 20 mg/dL (ref 6–23)
CALCIUM: 9.3 mg/dL (ref 8.4–10.5)
CO2: 31 meq/L (ref 19–32)
CREATININE: 1.16 mg/dL (ref 0.40–1.50)
Chloride: 102 mEq/L (ref 96–112)
GFR: 68.61 mL/min (ref >60.00)
GLUCOSE: 106 mg/dL — AB (ref 70–99)
Potassium: 4.3 mEq/L (ref 3.5–5.1)
Sodium: 139 mEq/L (ref 135–145)

## 2016-08-13 LAB — PSA: PSA: 1.2 ng/mL (ref 0.10–4.00)

## 2016-08-13 LAB — TSH: TSH: 1.29 u[IU]/mL (ref 0.35–4.50)

## 2016-08-13 LAB — HEPATIC FUNCTION PANEL
ALBUMIN: 4.4 g/dL (ref 3.5–5.2)
ALK PHOS: 59 U/L (ref 39–117)
ALT: 21 U/L (ref 0–53)
AST: 20 U/L (ref 0–37)
BILIRUBIN DIRECT: 0.1 mg/dL (ref 0.0–0.3)
Total Bilirubin: 0.5 mg/dL (ref 0.2–1.2)
Total Protein: 6.7 g/dL (ref 6.0–8.3)

## 2016-08-13 NOTE — Progress Notes (Signed)
Subjective:    Patient ID: Ryan [name]., male    DOB: 01/31/1956, 60 y.o.   MRN: [id number]  HPI  Patient presents today for complete physical.  Immunizations: flu shot up to date, tetanus 2013 Diet: has improved his diet and has lost some weight Exercise: cuts wood.   Colonoscopy: 2013  Wt Readings from Last 3 Encounters:  08/13/16 212 lb 9.6 oz (96.4 kg)  05/14/16 216 lb 6.4 oz (98.2 kg)  08/02/15 223 lb (101.2 kg)   Chest tightness- notes occasional chest tightness.  Chest tightness was non-exertional.   Review of Systems  Constitutional: Negative for unexpected weight change.  HENT: Negative for rhinorrhea.   Eyes: Negative for visual disturbance.  Respiratory: Negative for cough and shortness of breath.   Cardiovascular: Negative for leg swelling.       Notes occasional "chest tightness."    Gastrointestinal: Negative for blood in stool, constipation and diarrhea.  Genitourinary:       Reports hx of hematuria- last episode several months ago Denies dysuria/frequency  Musculoskeletal: Negative for arthralgias and myalgias.  Skin: Negative for rash.  Neurological:       Notes occasional dizziness  Hematological: Negative for adenopathy.  Psychiatric/Behavioral:       Denies depression/anxiety   Past Medical History:  Diagnosis Date  . Abnormal liver function    history of   . Blind left eye    blind left eye since birth  . Fatty liver    fatty liver disease  . GERD (gastroesophageal reflux disease)   . Hyperlipidemia   . Hypertension      Social History   Social History  . Marital status: Married    Spouse name: N/A  . Number of children: 1  . 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 Smoking status: Former Smoker    Types: Cigars    Quit date: 11/26/2014  . Smokeless tobacco: Never Used     Comment:  smoker off and on   . Alcohol use 1.2 oz/week    2 Glasses of  wine per week     Comment: socially  . Drug use: No  . Sexual activity: Not on file     Comment: 3 CIGARS A WEEK   Other Topics Concern  . Not on file   Social History Narrative   Married 13 years (45rd)   1 daughter 40   Current Smoker - pipe smoker (on and off)   Alcohol use-yes (social - wine)    Occupation:  Dealer          Past Surgical History:  Procedure Laterality Date  . TONSILLECTOMY      Family History  Problem Relation Age of Onset  . Colon cancer Mother     deceased age 50  . Diabetes Mother   . Lung cancer Father     deceased age 96  . Heart attack Father     MI at age 23 and CABG  . Stomach cancer Neg Hx     Allergies  Allergen Reactions  . Atorvastatin Other (See Comments)    Hands swelled    Current Outpatient Prescriptions on File Prior to Visit  Medication Sig Dispense Refill  . lisinopril-hydrochlorothiazide (PRINZIDE,ZESTORETIC) 10-12.5 MG tablet TAKE 1 TABLET DAILY 90 tablet 1  . Omega-3 Fatty Acids (FISH OIL) 1000 MG CAPS Take 1,000 mg by mouth. Take 4  capsules by mouth once a day     No current facility-administered medications on file prior to visit.     BP 134/87 (BP Location: Right Arm, Cuff Size: Large)   Pulse 71   Temp 98.2 F (36.8 C) (Oral)   Resp 16   Ht 5\' 10"  (1.778 m)   Wt 212 lb 9.6 oz (96.4 kg)   SpO2 100% Comment: room air  BMI 30.50 kg/m       Objective:   Physical Exam  Physical Exam  Constitutional: He is oriented to person, place, and time. He appears well-developed and well-nourished. No distress.  HENT:  Head: Normocephalic and atraumatic.  Right Ear: Tympanic membrane and ear canal normal.  Left Ear: Tympanic membrane and ear canal normal.  Mouth/Throat: Oropharynx is clear and moist.  Eyes: Pupils are equal, round, and reactive to light. No scleral icterus. Right lateral sclera is injected Neck: Normal range of motion. No thyromegaly present.  Cardiovascular: Normal rate and regular rhythm.     No murmur heard. Pulmonary/Chest: Effort normal and breath sounds normal. No respiratory distress. He has no wheezes. He has no rales. He exhibits no tenderness.  Abdominal: Soft. Bowel sounds are normal. He exhibits no distension and no mass. There is no tenderness. There is no rebound and no guarding.  Musculoskeletal: He exhibits no edema.  Lymphadenopathy:    He has no cervical adenopathy.  Neurological: He is alert and oriented to person, place, and time. He has normal patellar reflexes. He exhibits normal muscle tone. Coordination normal.  Skin: Skin is warm and dry.  Psychiatric: He has a normal mood and affect. His behavior is normal. Judgment and thought content normal.           Assessment & Plan:   Preventative care- immunizations reviewed and up to date.  He will be due for colo this spring. Discussed healthy diet and exercise.  Discussed pros/cons of PSA- pt wishes to proceed.   Atypical chest pain- EKG tracing is personally reviewed.  EKG notes NSR.  No acute changes. Will refer to cardiology for further work up. Advised pt that he has recurrent chest pain he should proceed to the ER.  Pt verbalizes understanding.        Assessment & Plan:

## 2016-08-13 NOTE — Patient Instructions (Addendum)
You should be contacted by GI in March about scheduling a repeat colonoscopy.  Let me know if you are not contacted.   Go to the ER if you develop chest pain. You will be contacted about your referral to Cardiology.

## 2016-08-13 NOTE — Progress Notes (Signed)
Pre visit review using our clinic review tool, if applicable. No additional management support is needed unless otherwise documented below in the visit note. 

## 2016-08-17 ENCOUNTER — Encounter: Payer: Self-pay | Admitting: Family

## 2016-09-18 ENCOUNTER — Ambulatory Visit: Payer: 59 | Admitting: Cardiology

## 2016-09-25 ENCOUNTER — Ambulatory Visit (INDEPENDENT_AMBULATORY_CARE_PROVIDER_SITE_OTHER): Payer: 59 | Admitting: Internal Medicine

## 2016-09-25 ENCOUNTER — Encounter: Payer: Self-pay | Admitting: Internal Medicine

## 2016-09-25 VITALS — BP 128/100 | HR 69 | Ht 69.0 in | Wt 212.8 lb

## 2016-09-25 DIAGNOSIS — R0789 Other chest pain: Secondary | ICD-10-CM | POA: Diagnosis not present

## 2016-09-25 DIAGNOSIS — I10 Essential (primary) hypertension: Secondary | ICD-10-CM | POA: Diagnosis not present

## 2016-09-25 DIAGNOSIS — R42 Dizziness and giddiness: Secondary | ICD-10-CM

## 2016-09-25 MED ORDER — LISINOPRIL 10 MG PO TABS: 10.0000 mg | ORAL_TABLET | Freq: Every day | ORAL | 3 refills | Status: DC

## 2016-09-25 NOTE — Patient Instructions (Addendum)
Medication Instructions:  Your physician has recommended you make the following change in your medication:  1- STOP Lisinopril/HCTZ. 2- START Lisinopril 10 mg by mouth once a day.   Labwork: - None ordered.   Testing/Procedures: Your physician has requested that you have an echocardiogram. Echocardiography is a painless test that uses sound waves to create images of your heart. It provides your doctor with information about the size and shape of your heart and how well your heart's chambers and valves are working. This procedure takes approximately one hour. There are no restrictions for this procedure.    Follow-Up: Your physician recommends that you schedule a follow-up appointment in: [place].   Any Other Special Instructions Will Be Listed Below (If Applicable).  Dr End recommends that you stay hydrated. It is good to drink plenty of water (for example, eight 8-oz glasses of water per day). You should limit intake of sugary and caffeinated beverages.    If you need a refill on your cardiac medications before your next appointment, please call your pharmacy.

## 2016-09-25 NOTE — Progress Notes (Signed)
New Outpatient Visit Date: 09/25/2016  Referring Provider: Debbrah Alar, NP [address] [place], [place] [address]  Chief Complaint: Lightheadedness and chest pain  HPI:  [name] is a 61 y.o. year-old male with history of hypertension, hyperlipidemia, GERD, and hepatic steatosis, who has been referred by Dr. Inda Castle for evaluation of chest pain. He has experienced occasional chest tightness off and on for several months. However, review of the chart reveals that he has actually had several ischemia evaluations dating back as far as 2003. He describes a tightness over the lower chest. This typically nonexertional and often occurs when lying in bed. It lasts for a few seconds to one minute. He denies associated symptoms. The patient reports a history of GERD, for which she was taking Prevacid at one point. He has not taken this medication in quite some time, as he was concerned about possible renal toxicity with prolonged use.  Ryan [name] is more concerned today about lightheadedness. He has had this for the last several months, noting that it occurs about once every one to 2 months. He feels lightheaded today. He has had one episode during which he passed out. This occurred shortly after he was started on lisinopril/HCTZ. He began cutting his tablets in half with improvement in his lightheadedness. He also had a severe episode of lightheadedness without syncope in 05/2016. It occurred while he was at the bed having his dog let down. He had accompanying nausea.  --------------------------------------------------------------------------------------------------  Cardiovascular History & Procedures: Cardiovascular Problems:  Lightheadedness  Atypical chest pain  Risk Factors:  Hypertension, hyperlipidemia, male gender, and age > 53  Cath/PCI:  None  CV Surgery:  None  EP Procedures and Devices:  None  Non-Invasive Evaluation(s):  Pharmacologic  myocardial perfusion stress test (10/19/14): No significant ischemia or scar. LVEF 50%.  Transthoracic echocardiogram (09/06/09): Normal LV and RV size and function. No significant valvular abnormalities.  Recent CV Pertinent Labs: Lab Results  Component Value Date   CHOL 187 08/13/2016   HDL 39.20 08/13/2016   LDLCALC 133 (H) 08/13/2016   LDLDIRECT 144.3 11/17/2007   TRIG 77.0 08/13/2016   CHOLHDL 5 08/13/2016   K 4.3 08/13/2016   BUN 20 08/13/2016   CREATININE 1.16 08/13/2016   CREATININE 1.10 02/17/2013    --------------------------------------------------------------------------------------------------  Past Medical History:  Diagnosis Date  . Abnormal liver function    history of   . Blind left eye    blind left eye since birth  . Cancer ([place])    skin:  basal cell carcinoma  . Fatty liver    fatty liver disease  . GERD (gastroesophageal reflux disease)   . Hyperlipidemia   . Hypertension     Past Surgical History:  Procedure Laterality Date  . TONSILLECTOMY      Outpatient Encounter Prescriptions as of 09/25/2016  Medication Sig  . lisinopril-hydrochlorothiazide (PRINZIDE,ZESTORETIC) 10-12.5 MG tablet TAKE 1 TABLET DAILY  . Omega-3 Fatty Acids (FISH OIL) 1000 MG CAPS Take 1,000 mg by mouth. Take 4 capsules by mouth once a day   No facility-administered encounter medications on file as of 09/25/2016.     Allergies: Atorvastatin  Social History   Social History  . Marital status: Married    Spouse name: N/A  . Number of children: 1  . 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 Smoking status: Former Smoker    Types: Cigars  Quit date: 11/26/2014  . Smokeless tobacco: Never Used     Comment: smokes occasional cigar  . Alcohol use 1.2 oz/week    2 Glasses of wine per week     Comment: socially  . Drug use: No  . Sexual activity: Not on file     Comment: 3 CIGARS A WEEK    Other Topics Concern  . Not on file   Social History Narrative   Married 13 years (3rd)   1 daughter 16   Current Smoker - pipe smoker (on and off)   Alcohol use-yes (social - wine)    Occupation:  Dealer          Family History  Problem Relation Age of Onset  . Colon cancer Mother     deceased age 53  . Diabetes Mother   . Lung cancer Father     deceased age 63  . Heart attack Father     MI at age 74 and CABG  . Stomach cancer Neg Hx     Review of Systems: A 12-system review of systems was performed and was negative except as noted in the HPI.  --------------------------------------------------------------------------------------------------  Physical Exam: BP (!) 128/100 (BP Location: Right Arm, Patient Position: Sitting, Cuff Size: Normal)   Pulse 69   Ht 5\' 9"  (1.753 m)   Wt 212 lb 12 oz (96.5 kg)   BMI 31.42 kg/m   Position Blood pressure (mmHg) Heart rate (bpm)  Lying 154/98 71  Sitting 157/96 69  Standing 138/86 69  Standing (3 minutes) 136/91 68   General:  Overweight man, seated comfortably in the exam room. HEENT: No conjunctival pallor or scleral icterus.  Moist mucous membranes.  OP clear. Neck: Supple without lymphadenopathy, thyromegaly, JVD, or HJR.  No carotid bruit. Lungs: Normal work of breathing.  Clear to auscultation bilaterally without wheezes or crackles. Heart: Distant heart sounds. Regular rate and rhythm without murmurs, rubs, or gallops.  Non-displaced PMI. Abd: Bowel sounds present.  Soft, NT/ND without hepatosplenomegaly Ext: No lower extremity edema.  Radial, PT, and DP pulses are 2+ bilaterally Skin: warm and dry without rash Neuro: CNIII-XII intact.  Strength and fine-touch sensation intact in upper and lower extremities bilaterally. Psych: Normal mood and affect.  EKG:  Normal sinus rhythm without significant abnormalities or changes from prior tracing on 08/13/16 (I have personally reviewed both tracings).  Lab Results   Component Value Date   WBC 5.6 08/13/2016   HGB 14.5 08/13/2016   HCT 43.2 08/13/2016   MCV 88.5 08/13/2016   PLT 192.0 08/13/2016    Lab Results  Component Value Date   NA 139 08/13/2016   K 4.3 08/13/2016   CL 102 08/13/2016   CO2 31 08/13/2016   BUN 20 08/13/2016   CREATININE 1.16 08/13/2016   GLUCOSE 106 (H) 08/13/2016   ALT 21 08/13/2016    Lab Results  Component Value Date   CHOL 187 08/13/2016   HDL 39.20 08/13/2016   LDLCALC 133 (H) 08/13/2016   LDLDIRECT 144.3 11/17/2007   TRIG 77.0 08/13/2016   CHOLHDL 5 08/13/2016    --------------------------------------------------------------------------------------------------  ASSESSMENT AND PLAN: Atypical chest pain Patient symptoms predominate current rest, often while lying in bed at night. Symptoms are suspicious for possible component of GERD. EKG today is normal. Myocardial perfusion stress test less than 2 years ago was without evidence or scar ischemia or scar. LVEF was borderline low at the time. We have therefore agreed to obtain transthoracic echocardiogram. I suggested  that he try him. Therapy for GERD with an over-the-counter antacid such as famotidine.  Lightheadedness Patient has borderline positive orthostatic vital signs today. That he reports trying to stay hydrated, it is possible that he may have intravascular volume depletion, particularly in the setting of HCTZ use. I have encouraged him to increase his fluid intake. We will also discontinue HCTZ today and obtain a transthoracic echocardiogram to evaluate for significant structural abnormalities, particularly since LVEF was low normal by nuclear stress test in 2016.  Essential hypertension Blood pressure is mildly elevated today, albeit with orthostatic hypotension. I suspect some of this may be due to intravascular volume depletion from HCTZ. We will discontinue HCTZ today and increase lisinopril to 10 mg daily (he had only been taking 5 mg daily up  until now).  Follow-up: Return to clinic in 1 month.  Nelva Bush, MD 09/25/2016 2:59 PM

## 2016-09-26 ENCOUNTER — Ambulatory Visit: Payer: 59 | Admitting: Internal Medicine

## 2016-09-27 DIAGNOSIS — C44311 Basal cell carcinoma of skin of nose: Secondary | ICD-10-CM | POA: Diagnosis not present

## 2016-10-10 ENCOUNTER — Other Ambulatory Visit: Payer: Self-pay | Admitting: Internal Medicine

## 2016-10-10 DIAGNOSIS — R0789 Other chest pain: Secondary | ICD-10-CM

## 2016-10-10 DIAGNOSIS — R42 Dizziness and giddiness: Secondary | ICD-10-CM

## 2016-10-17 ENCOUNTER — Ambulatory Visit (INDEPENDENT_AMBULATORY_CARE_PROVIDER_SITE_OTHER): Payer: 59

## 2016-10-17 ENCOUNTER — Other Ambulatory Visit: Payer: Self-pay

## 2016-10-17 DIAGNOSIS — R0789 Other chest pain: Secondary | ICD-10-CM | POA: Diagnosis not present

## 2016-10-17 DIAGNOSIS — R42 Dizziness and giddiness: Secondary | ICD-10-CM | POA: Diagnosis not present

## 2016-10-23 ENCOUNTER — Ambulatory Visit (INDEPENDENT_AMBULATORY_CARE_PROVIDER_SITE_OTHER): Payer: 59 | Admitting: Internal Medicine

## 2016-10-23 ENCOUNTER — Encounter: Payer: Self-pay | Admitting: Internal Medicine

## 2016-10-23 VITALS — BP 120/82 | HR 71 | Ht 69.0 in | Wt 212.5 lb

## 2016-10-23 DIAGNOSIS — R0789 Other chest pain: Secondary | ICD-10-CM

## 2016-10-23 DIAGNOSIS — I10 Essential (primary) hypertension: Secondary | ICD-10-CM

## 2016-10-23 DIAGNOSIS — R42 Dizziness and giddiness: Secondary | ICD-10-CM | POA: Diagnosis not present

## 2016-10-23 DIAGNOSIS — Z79899 Other long term (current) drug therapy: Secondary | ICD-10-CM

## 2016-10-23 MED ORDER — ASPIRIN EC 81 MG PO TBEC: 81.0000 mg | DELAYED_RELEASE_TABLET | Freq: Every day | ORAL | 3 refills | Status: DC

## 2016-10-23 NOTE — Patient Instructions (Signed)
Medication Instructions:  Your physician has recommended you make the following change in your medication:  1- START Aspirin 81 mg by  Mouth once a day.   Labwork: Your physician recommends that you return for lab work in: TODAY (BMP).   Testing/Procedures: NONE  Follow-Up: Your physician wants you to follow-up in:  12 MONTHS WITH DR END.  You will receive a reminder letter in the mail two months in advance. If you don't receive a letter, please call our office to schedule the follow-up appointment.   If you need a refill on your cardiac medications before your next appointment, please call your pharmacy.

## 2016-10-23 NOTE — Progress Notes (Signed)
Follow-up Outpatient Visit Date: 10/23/2016  Primary Care Provider: O'SULLIVAN,MELISSA S., NP [address] [address]  Chief Complaint: Follow-up atypical chest pain and lightheadedness  HPI:  Mr. Poke is a 61 y.o. year-old male with history of hypertension, hyperlipidemia, GERD, and hepatic steatosis, who presents for follow-up of atypical chest pain and lightheadedness. I last saw the patient on 09/25/16. At the time, he complained of intermittent atypical chest pain with a history of prior ischemia evaluations without evidence of obstructive CAD. He also had intermittent lightheadedness with evidence of mild orthostatic hypotension. We therefore agreed to discontinue hydrochlorothiazide, increase fluid intake, and increase lisinopril due to suboptimally controlled blood pressure. Transthoracic echocardiogram was also performed, which did not show any significant abnormalities.  Since our last visit, Mr. Thi has felt well. He has not had any further episodes of chest pain. His lightheadedness has also resolved. He denies lower extremity edema as well as orthopnea, PND, palpitations, and shortness of breath. He has not had any side effects from increased dose of lisinopril.  --------------------------------------------------------------------------------------------------  Cardiovascular History & Procedures: Cardiovascular Problems:  Lightheadedness  Atypical chest pain  Risk Factors:  Hypertension, hyperlipidemia, male gender, and age > 27  Cath/PCI:  None  CV Surgery:  None  EP Procedures and Devices:  None  Non-Invasive Evaluation(s):  Transthoracic echocardiogram (10/17/16): Normal LV size with mild LVH. LVEF 55-60% with grade 1 diastolic dysfunction. Mild mitral valve thickening and trivial MR. Normal RV size and function.  Pharmacologic myocardial perfusion stress test (10/19/14): No significant ischemia or scar. LVEF  50%.  Transthoracic echocardiogram (09/06/09): Normal LV and RV size and function. No significant valvular abnormalities.   Recent CV Pertinent Labs: Lab Results  Component Value Date   CHOL 187 08/13/2016   HDL 39.20 08/13/2016   LDLCALC 133 (H) 08/13/2016   LDLDIRECT 144.3 11/17/2007   TRIG 77.0 08/13/2016   CHOLHDL 5 08/13/2016   K 4.3 08/13/2016   BUN 20 08/13/2016   CREATININE 1.16 08/13/2016   CREATININE 1.10 02/17/2013    Past medical and surgical history were reviewed and updated in EPIC.  Outpatient Encounter Prescriptions as of 10/23/2016  Medication Sig  . lisinopril (PRINIVIL,ZESTRIL) 10 MG tablet Take 1 tablet (10 mg total) by mouth daily.  . Omega-3 Fatty Acids (FISH OIL) 1000 MG CAPS Take 1,000 mg by mouth. Take 4 capsules by mouth once a day   No facility-administered encounter medications on file as of 10/23/2016.     Allergies: Atorvastatin  Social History   Social History  . Marital status: Married    Spouse name: N/A  . Number of children: 1  . 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 Smoking status: Former Smoker    Types: Cigars    Quit date: 11/26/2014  . Smokeless tobacco: Never Used     Comment: smokes occasional cigar  . Alcohol use 2.4 oz/week    2 Cans of beer, 2 Shots of liquor per week  . Drug use: No  . Sexual activity: Not on file   Other Topics Concern  . Not on file   Social History Narrative   Married 13 years (12rd)   1 daughter 83   Current Smoker - pipe smoker (on and off)   Alcohol use-yes (social - wine)    Occupation:  Dealer          Family  History  Problem Relation Age of Onset  . Colon cancer Mother     deceased age 45  . Diabetes Mother   . Hypertension Mother   . Lung cancer Father     deceased age 46  . Heart disease Father     Bypass in mid-late 31's  . Heart attack Father     28    Review of Systems: A 12-system  review of systems was performed and was negative except as noted in the HPI.  --------------------------------------------------------------------------------------------------  Physical Exam: BP 120/82 (BP Location: Left Arm, Patient Position: Sitting, Cuff Size: Normal)   Pulse 71   Ht 5\' 9"  (1.753 m)   Wt 212 lb 8 oz (96.4 kg)   BMI 31.38 kg/m   General:  Overweight man, seated When the exam room. HEENT: No conjunctival pallor or scleral icterus.  Moist mucous membranes.  OP clear. Neck: Supple without lymphadenopathy, thyromegaly, JVD, or HJR. Lungs: Normal work of breathing.  Clear to auscultation bilaterally without wheezes or crackles. Heart: Regular rate and rhythm without murmurs, rubs, or gallops.  Non-displaced PMI. Abd: Bowel sounds present.  Soft, NT/ND without hepatosplenomegaly Ext: No lower extremity edema.  Radial, PT, and DP pulses are 2+ bilaterally. Skin: warm and dry without rash  Lab Results  Component Value Date   WBC 5.6 08/13/2016   HGB 14.5 08/13/2016   HCT 43.2 08/13/2016   MCV 88.5 08/13/2016   PLT 192.0 08/13/2016    Lab Results  Component Value Date   NA 139 08/13/2016   K 4.3 08/13/2016   CL 102 08/13/2016   CO2 31 08/13/2016   BUN 20 08/13/2016   CREATININE 1.16 08/13/2016   GLUCOSE 106 (H) 08/13/2016   ALT 21 08/13/2016    Lab Results  Component Value Date   CHOL 187 08/13/2016   HDL 39.20 08/13/2016   LDLCALC 133 (H) 08/13/2016   LDLDIRECT 144.3 11/17/2007   TRIG 77.0 08/13/2016   CHOLHDL 5 08/13/2016    --------------------------------------------------------------------------------------------------  ASSESSMENT AND PLAN: Atypical chest pain Symptoms have resolved. Myocardial perfusion stress test in 2016 was without ischemia. No further workup at this time. Continue primary prevention of CAD.  Hypertension Blood pressure controlled today. Lightheadedness has resolved with discontinuation of hydrochlorothiazide. We will  continue with current dose of lisinopril and check a basic metabolic panel today to ensure his renal function and electrolytes are stable.  Lightheadedness Suspect this was due to intravascular volume depletion. This has resolved with improved fluid intake and discontinuation of HCTZ. No further workup or intervention at this time.  Follow-up: Return to clinic in one year.  Nelva Bush, MD 10/24/2016 7:57 PM

## 2016-10-24 ENCOUNTER — Encounter: Payer: Self-pay | Admitting: Internal Medicine

## 2016-10-24 LAB — BASIC METABOLIC PANEL
BUN/Creatinine Ratio: 16 (ref 9–20)
BUN: 18 mg/dL (ref 6–24)
CHLORIDE: 97 mmol/L (ref 96–106)
CO2: 23 mmol/L (ref 18–29)
CREATININE: 1.13 mg/dL (ref 0.76–1.27)
Calcium: 9.8 mg/dL (ref 8.7–10.2)
GFR calc Af Amer: 82 mL/min/{1.73_m2} (ref >59)
GFR calc non Af Amer: 71 mL/min/{1.73_m2} (ref >59)
GLUCOSE: 90 mg/dL (ref 65–99)
Potassium: 4.3 mmol/L (ref 3.5–5.2)
SODIUM: 136 mmol/L (ref 134–144)

## 2017-01-06 ENCOUNTER — Other Ambulatory Visit: Payer: Self-pay | Admitting: Family

## 2017-01-07 NOTE — Telephone Encounter (Signed)
Request for Lisinopril-HCTZ 10/12.5mg  was Denied as patient's medication was changed by Dr. Nelva Bush, MD at [facility] on 09/25/16 [d/c HCTZ], which makes this request inactive; pt has not been in to see PCP since this change was made in pt's medication. I have sent a note to [facility] to inform them of this change, and request that they send Rx refill request to Cardiology until patient is seen by PCP to assess & evaluate this medication change/SLS 05/14  Please call patient and have him schedule F/U appointment now [due [06/18], as he has had change made in BP medication and needs to be evaluated by PCP post this change by Cardiology before [facility] can prescribe medication. Thanks/SLS [phone]

## 2017-01-08 NOTE — Telephone Encounter (Signed)
lvm advising patient of message below, awaiting call back °

## 2017-02-13 DIAGNOSIS — D224 Melanocytic nevi of scalp and neck: Secondary | ICD-10-CM | POA: Diagnosis not present

## 2017-02-13 DIAGNOSIS — Z85828 Personal history of other malignant neoplasm of skin: Secondary | ICD-10-CM | POA: Diagnosis not present

## 2017-02-13 DIAGNOSIS — L821 Other seborrheic keratosis: Secondary | ICD-10-CM | POA: Diagnosis not present

## 2017-04-12 DIAGNOSIS — H2513 Age-related nuclear cataract, bilateral: Secondary | ICD-10-CM | POA: Diagnosis not present

## 2017-04-12 DIAGNOSIS — H53032 Strabismic amblyopia, left eye: Secondary | ICD-10-CM | POA: Diagnosis not present

## 2017-04-12 DIAGNOSIS — H47323 Drusen of optic disc, bilateral: Secondary | ICD-10-CM | POA: Diagnosis not present

## 2017-08-27 DIAGNOSIS — I509 Heart failure, unspecified: Secondary | ICD-10-CM

## 2017-08-27 DIAGNOSIS — R0602 Shortness of breath: Secondary | ICD-10-CM

## 2017-08-27 HISTORY — DX: Shortness of breath: R06.02

## 2017-08-27 HISTORY — DX: Heart failure, unspecified: I50.9

## 2017-10-14 ENCOUNTER — Other Ambulatory Visit: Payer: Self-pay | Admitting: Internal Medicine

## 2017-10-22 NOTE — Progress Notes (Signed)
Follow-up Outpatient Visit Date: 10/23/2017  Primary Care Provider: Debbrah Alar, NP [address] [place] [address]  Chief Complaint: Chest pain  HPI:  Ryan [name] is a 62 y.o. year-old male with history of hypertension, hyperlipidemia, GERD, and hepatic steatosis, who presents for follow-up of hypertension. I last saw [name] a year ago, at which time he was doing well without any further episodes of chest pain.  Today, [name] reports two episodes of chest pain over the last 1-2 months.  Both occurred with strenuous activity (climbing 5 flights of stairs and chopping wood).  He describes substernal tightness with accompanying shortness of breath, palpitations, and lightheadedness.  The discomfort resolved within a minute of stopping to rest and was up to 5/10 in intensity.  He has not had any pain with modest activity or at rest.  He otherwise has felt well, denying shortness of breath, and edema.  He notes occasional orthostatic lightheadedness at work.  He has not fallen or passed out.  He is tolerating his current medications well.  [name] is concerned that his recent chest pain may reflect coronary artery disease.  He notes that his father underwent bypass surgery around the same age.  --------------------------------------------------------------------------------------------------  Cardiovascular History & Procedures: Cardiovascular Problems:  Chest pain  Lightheadedness  Risk Factors:  Hypertension, hyperlipidemia, male gender, and age > 73  Cath/PCI:  None  CV Surgery:  None  EP Procedures and Devices:  None  Non-Invasive Evaluation(s):  Transthoracic echocardiogram (10/17/16): Normal LV size with mild LVH. LVEF 55-60% with grade 1 diastolic dysfunction. Mild mitral valve thickening and trivial MR. Normal RV size and function.  Pharmacologic myocardial perfusion stress test (10/19/14): No significant ischemia or scar.  LVEF 50%.  Transthoracic echocardiogram (09/06/09): Normal LV and RV size and function. No significant valvular abnormalities.  Recent CV Pertinent Labs: Lab Results  Component Value Date   CHOL 187 08/13/2016   HDL 39.20 08/13/2016   LDLCALC 133 (H) 08/13/2016   LDLDIRECT 144.3 11/17/2007   TRIG 77.0 08/13/2016   CHOLHDL 5 08/13/2016   K 4.3 10/23/2016   BUN 18 10/23/2016   CREATININE 1.13 10/23/2016   CREATININE 1.10 02/17/2013    Past medical and surgical history were reviewed and updated in EPIC.  Current Meds  Medication Sig  . aspirin EC 81 MG tablet Take 1 tablet (81 mg total) by mouth daily.  Marland Kitchen lisinopril (PRINIVIL,ZESTRIL) 10 MG tablet TAKE 1 TABLET BY MOUTH DAILY  . Omega-3 Fatty Acids (FISH OIL) 1000 MG CAPS Take 1,000 mg by mouth. Take 4 capsules by mouth once a day    Allergies: Atorvastatin  Social History   Socioeconomic History  . Marital status: Married    Spouse name: Not on file  . Number of children: 1  . Years of education: Not on file  . Highest education level: Not on file  Social Needs  . Financial resource strain: Not on file  . Food insecurity - worry: Not on file  . Food insecurity - inability: Not on file  . Transportation needs - medical: Not on file  . Transportation needs - non-medical: Not on file  Occupational History  . Occupation: Designer, industrial/product: [employer]  Tobacco Use  . Smoking status: Current Some Day Smoker    Types: Cigars    Last attempt to quit: 11/26/2014    Years since quitting: 2.9  . Smokeless tobacco: Never Used  . Tobacco  comment: smokes occasional cigar on vacation  Substance and Sexual Activity  . Alcohol use: Yes    Alcohol/week: 1.2 oz    Types: 1 Cans of beer, 1 Standard drinks or equivalent per week  . Drug use: No  . Sexual activity: Not on file  Other Topics Concern  . Not on file  Social History Narrative       Family History  Problem Relation Age of Onset  . Colon cancer  Mother        deceased age 47  . Diabetes Mother   . Hypertension Mother   . Lung cancer Father        deceased age 68  . Heart disease Father        Bypass in mid-late 49's  . Heart attack Father        49    Review of Systems: A 12-system review of systems was performed and was negative except as noted in the HPI.  --------------------------------------------------------------------------------------------------  Physical Exam: BP 120/80 (BP Location: Left Arm, Patient Position: Sitting, Cuff Size: Normal)   Pulse 72   Ht 5\' 9"  (1.753 m)   Wt 219 lb 12 oz (99.7 kg)   BMI 32.45 kg/m   General: Obese man, seated comfortably in the exam room. HEENT: No conjunctival pallor or scleral icterus. Moist mucous membranes.  OP clear. Neck: Supple without lymphadenopathy, thyromegaly, JVD, or HJR. Lungs: Normal work of breathing. Clear to auscultation bilaterally without wheezes or crackles. Heart: Regular rate and rhythm without murmurs, rubs, or gallops. Non-displaced PMI. Abd: Bowel sounds present. Soft, NT/ND without hepatosplenomegaly Ext: Trace LE edema. 2+ radial pulses bilaterally. Skin: Warm and dry without rash.  EKG: Normal sinus rhythm without abnormalities.  Lab Results  Component Value Date   WBC 5.6 08/13/2016   HGB 14.5 08/13/2016   HCT 43.2 08/13/2016   MCV 88.5 08/13/2016   PLT 192.0 08/13/2016    Lab Results  Component Value Date   NA 136 10/23/2016   K 4.3 10/23/2016   CL 97 10/23/2016   CO2 23 10/23/2016   BUN 18 10/23/2016   CREATININE 1.13 10/23/2016   GLUCOSE 90 10/23/2016   ALT 21 08/13/2016    Lab Results  Component Value Date   CHOL 187 08/13/2016   HDL 39.20 08/13/2016   LDLCALC 133 (H) 08/13/2016   LDLDIRECT 144.3 11/17/2007   TRIG 77.0 08/13/2016   CHOLHDL 5 08/13/2016    --------------------------------------------------------------------------------------------------  ASSESSMENT AND PLAN: Unstable angina [name] reports  two episodes of chest pain over the last few months with vigorous activity, concerning for angina.  Though his myocardial perfusion stress test in 2017 was normal, I am concerned that he has experienced progression of disease.  He has several cardiac risk factors, placing him at relatively high pretest probability for significant CAD.  We have discussed further evaluation options and have agreed to proceed with cardiac catheterization and possible PCI next week. I have reviewed the risks, indications, and alternatives to cardiac catheterization, possible angioplasty, and stenting with the patient. Risks include but are not limited to bleeding, infection, vascular injury, stroke, myocardial infection, arrhythmia, kidney injury, radiation-related injury in the case of prolonged fluoroscopy use, emergency cardiac surgery, and death. The patient understands the risks of serious complication is 1-2 in 1761 with diagnostic cardiac cath and 1-2% or less with angioplasty/stenting.  In the meantime, we will add metoprolol tartrate 12.5 mg twice daily for antianginal therapy.  I have provided Mr. Wieting with a  prescription for sublingual nitroglycerin.  I advised him to seek immediate medical attention if he has any recurrent chest pain that does not resolve promptly with sublingual nitroglycerin.  We will check routine precatheterization labs today (CBC, BMP, and PT/INR).  Hypertension Blood pressure well controlled today.  We will continue current dose of lisinopril and add a low-dose metoprolol, as above.  Hyperlipidemia LDL noted to be mildly elevated in the past.  Unfortunately, Mr. Mateo has been intolerant of atorvastatin.  I will defer adding a lipid-lowering agent at this time, though we will need to readdress this based on the results of his upcoming cardiac catheterization.  Follow-up: To be determined based on results of catheterization.  Nelva Bush, MD 10/23/2017 9:04 AM

## 2017-10-22 NOTE — H&P (View-Only) (Signed)
Follow-up Outpatient Visit Date: 10/23/2017  Primary Care Provider: Debbrah Alar, NP [address] [place] [address]  Chief Complaint: Chest pain  HPI:  [name] is a 62 y.o. year-old male with history of hypertension, hyperlipidemia, GERD, and hepatic steatosis, who presents for follow-up of hypertension. I last saw Ryan [name] a year ago, at which time he was doing well without any further episodes of chest pain.  Today, Ryan [name] reports two episodes of chest pain over the last 1-2 months.  Both occurred with strenuous activity (climbing 5 flights of stairs and chopping wood).  He describes substernal tightness with accompanying shortness of breath, palpitations, and lightheadedness.  The discomfort resolved within a minute of stopping to rest and was up to 5/10 in intensity.  He has not had any pain with modest activity or at rest.  He otherwise has felt well, denying shortness of breath, and edema.  He notes occasional orthostatic lightheadedness at work.  He has not fallen or passed out.  He is tolerating his current medications well.  Ryan [name] is concerned that his recent chest pain may reflect coronary artery disease.  He notes that his father underwent bypass surgery around the same age.  --------------------------------------------------------------------------------------------------  Cardiovascular History & Procedures: Cardiovascular Problems:  Chest pain  Lightheadedness  Risk Factors:  Hypertension, hyperlipidemia, male gender, and age > 19  Cath/PCI:  None  CV Surgery:  None  EP Procedures and Devices:  None  Non-Invasive Evaluation(s):  Transthoracic echocardiogram (10/17/16): Normal LV size with mild LVH. LVEF 55-60% with grade 1 diastolic dysfunction. Mild mitral valve thickening and trivial MR. Normal RV size and function.  Pharmacologic myocardial perfusion stress test (10/19/14): No significant ischemia or scar.  LVEF 50%.  Transthoracic echocardiogram (09/06/09): Normal LV and RV size and function. No significant valvular abnormalities.  Recent CV Pertinent Labs: Lab Results  Component Value Date   CHOL 187 08/13/2016   HDL 39.20 08/13/2016   LDLCALC 133 (H) 08/13/2016   LDLDIRECT 144.3 11/17/2007   TRIG 77.0 08/13/2016   CHOLHDL 5 08/13/2016   K 4.3 10/23/2016   BUN 18 10/23/2016   CREATININE 1.13 10/23/2016   CREATININE 1.10 02/17/2013    Past medical and surgical history were reviewed and updated in EPIC.  Current Meds  Medication Sig  . aspirin EC 81 MG tablet Take 1 tablet (81 mg total) by mouth daily.  Marland Kitchen lisinopril (PRINIVIL,ZESTRIL) 10 MG tablet TAKE 1 TABLET BY MOUTH DAILY  . Omega-3 Fatty Acids (FISH OIL) 1000 MG CAPS Take 1,000 mg by mouth. Take 4 capsules by mouth once a day    Allergies: Atorvastatin  Social History   Socioeconomic History  . Marital status: Married    Spouse name: Not on file  . Number of children: 1  . Years of education: Not on file  . Highest education level: Not on file  Social Needs  . Financial resource strain: Not on file  . Food insecurity - worry: Not on file  . Food insecurity - inability: Not on file  . Transportation needs - medical: Not on file  . Transportation needs - non-medical: Not on file  Occupational History  . Occupation: Designer, industrial/product: [employer]  Tobacco Use  . Smoking status: Current Some Day Smoker    Types: Cigars    Last attempt to quit: 11/26/2014    Years since quitting: 2.9  . Smokeless tobacco: Never Used  . Tobacco  comment: smokes occasional cigar on vacation  Substance and Sexual Activity  . Alcohol use: Yes    Alcohol/week: 1.2 oz    Types: 1 Cans of beer, 1 Standard drinks or equivalent per week  . Drug use: No  . Sexual activity: Not on file  Other Topics Concern  . Not on file  Social History Narrative       Family History  Problem Relation Age of Onset  . Colon cancer  Mother        deceased age 10  . Diabetes Mother   . Hypertension Mother   . Lung cancer Father        deceased age 15  . Heart disease Father        Bypass in mid-late 37's  . Heart attack Father        26    Review of Systems: A 12-system review of systems was performed and was negative except as noted in the HPI.  --------------------------------------------------------------------------------------------------  Physical Exam: BP 120/80 (BP Location: Left Arm, Patient Position: Sitting, Cuff Size: Normal)   Pulse 72   Ht 5\' 9"  (1.753 m)   Wt 219 lb 12 oz (99.7 kg)   BMI 32.45 kg/m   General: Obese man, seated comfortably in the exam room. HEENT: No conjunctival pallor or scleral icterus. Moist mucous membranes.  OP clear. Neck: Supple without lymphadenopathy, thyromegaly, JVD, or HJR. Lungs: Normal work of breathing. Clear to auscultation bilaterally without wheezes or crackles. Heart: Regular rate and rhythm without murmurs, rubs, or gallops. Non-displaced PMI. Abd: Bowel sounds present. Soft, NT/ND without hepatosplenomegaly Ext: Trace LE edema. 2+ radial pulses bilaterally. Skin: Warm and dry without rash.  EKG: Normal sinus rhythm without abnormalities.  Lab Results  Component Value Date   WBC 5.6 08/13/2016   HGB 14.5 08/13/2016   HCT 43.2 08/13/2016   MCV 88.5 08/13/2016   PLT 192.0 08/13/2016    Lab Results  Component Value Date   NA 136 10/23/2016   K 4.3 10/23/2016   CL 97 10/23/2016   CO2 23 10/23/2016   BUN 18 10/23/2016   CREATININE 1.13 10/23/2016   GLUCOSE 90 10/23/2016   ALT 21 08/13/2016    Lab Results  Component Value Date   CHOL 187 08/13/2016   HDL 39.20 08/13/2016   LDLCALC 133 (H) 08/13/2016   LDLDIRECT 144.3 11/17/2007   TRIG 77.0 08/13/2016   CHOLHDL 5 08/13/2016    --------------------------------------------------------------------------------------------------  ASSESSMENT AND PLAN: Unstable angina Ryan [name] reports  two episodes of chest pain over the last few months with vigorous activity, concerning for angina.  Though his myocardial perfusion stress test in 2017 was normal, I am concerned that he has experienced progression of disease.  He has several cardiac risk factors, placing him at relatively high pretest probability for significant CAD.  We have discussed further evaluation options and have agreed to proceed with cardiac catheterization and possible PCI next week. I have reviewed the risks, indications, and alternatives to cardiac catheterization, possible angioplasty, and stenting with the patient. Risks include but are not limited to bleeding, infection, vascular injury, stroke, myocardial infection, arrhythmia, kidney injury, radiation-related injury in the case of prolonged fluoroscopy use, emergency cardiac surgery, and death. The patient understands the risks of serious complication is 1-2 in 6440 with diagnostic cardiac cath and 1-2% or less with angioplasty/stenting.  In the meantime, we will add metoprolol tartrate 12.5 mg twice daily for antianginal therapy.  I have provided Ryan [name] with a  prescription for sublingual nitroglycerin.  I advised him to seek immediate medical attention if he has any recurrent chest pain that does not resolve promptly with sublingual nitroglycerin.  We will check routine precatheterization labs today (CBC, BMP, and PT/INR).  Hypertension Blood pressure well controlled today.  We will continue current dose of lisinopril and add a low-dose metoprolol, as above.  Hyperlipidemia LDL noted to be mildly elevated in the past.  Unfortunately, Ryan [name] has been intolerant of atorvastatin.  I will defer adding a lipid-lowering agent at this time, though we will need to readdress this based on the results of his upcoming cardiac catheterization.  Follow-up: To be determined based on results of catheterization.  Nelva Bush, MD 10/23/2017 9:04 AM

## 2017-10-23 ENCOUNTER — Encounter: Payer: Self-pay | Admitting: Internal Medicine

## 2017-10-23 ENCOUNTER — Ambulatory Visit (INDEPENDENT_AMBULATORY_CARE_PROVIDER_SITE_OTHER): Payer: 59 | Admitting: Internal Medicine

## 2017-10-23 VITALS — BP 120/80 | HR 72 | Ht 69.0 in | Wt 219.8 lb

## 2017-10-23 DIAGNOSIS — I10 Essential (primary) hypertension: Secondary | ICD-10-CM | POA: Diagnosis not present

## 2017-10-23 DIAGNOSIS — E785 Hyperlipidemia, unspecified: Secondary | ICD-10-CM

## 2017-10-23 DIAGNOSIS — Z01810 Encounter for preprocedural cardiovascular examination: Secondary | ICD-10-CM | POA: Diagnosis not present

## 2017-10-23 DIAGNOSIS — I200 Unstable angina: Secondary | ICD-10-CM | POA: Diagnosis not present

## 2017-10-23 MED ORDER — NITROGLYCERIN 0.4 MG SL SUBL: 0.4000 mg | SUBLINGUAL_TABLET | SUBLINGUAL | 1 refills | Status: DC | PRN

## 2017-10-23 MED ORDER — METOPROLOL TARTRATE 25 MG PO TABS: 12.5000 mg | ORAL_TABLET | Freq: Two times a day (BID) | ORAL | 2 refills | Status: DC

## 2017-10-23 NOTE — Patient Instructions (Addendum)
Medication Instructions:  Your physician has recommended you make the following change in your medication:  1- START Metoprolol 12.5 mg (1/2 tablet) by mouth two times a day. 2- As needed - Nitroglycerin 0.4 mg tablet (1 tablet) under the tongue every 5 minutes as needed for chest pain. No more than 3 doses. If chest pain does not go away after 3 doses, call 911.   Labwork: Your physician recommends that you return for lab work in: TODAY (CBC, BMET, PT/INR).   Testing/Procedures: Your physician has requested that you have a LEFT HEART cardiac catheterization. Cardiac catheterization is used to diagnose and/or treat various heart conditions. Doctors may recommend this procedure for a number of different reasons. The most common reason is to evaluate chest pain. Chest pain can be a symptom of coronary artery disease (CAD), and cardiac catheterization can show whether plaque is narrowing or blocking your heart's arteries. This procedure is also used to evaluate the valves, as well as measure the blood flow and oxygen levels in different parts of your heart. For further information please visit [email or web address]. Please follow instruction sheet, as given.  [facility] Cardiac Cath Instructions   You are scheduled for a LEFT Cardiac Cath on:__03/05/2019_______  Please arrive at __08:30__am on the day of your procedure  Please expect a call from our [facility] to pre-register you  Do not eat/drink anything after midnight  Someone will need to drive you home  It is recommended someone be with you for the first 24 hours after your procedure  Wear clothes that are easy to get on/off and wear slip on shoes if possible   Medications bring a current list of all medications with you  _XX_ You may take all of your medications the morning of your procedure with enough water to swallow safely   Day of your procedure: Arrive at the [facility] entrance.  Free valet service is  available.  After entering the [facility] please check-in at the registration desk (1st desk on your right) to receive your armband. After receiving your armband someone will escort you to the cardiac cath/special procedures waiting area.  The usual length of stay after your procedure is about 2 to 3 hours.  This can vary.  If you have any questions, please call our office at [phone], or you may call the cardiac cath lab at [facility] directly at [phone]   Follow-Up: Your physician recommends that you schedule a follow-up appointment in: TO BE DETERMINED.   If you need a refill on your cardiac medications before your next appointment, please call your pharmacy.   Nitroglycerin sublingual tablets What is this medicine? NITROGLYCERIN (nye troe GLI ser in) is a type of vasodilator. It relaxes blood vessels, increasing the blood and oxygen supply to your heart. This medicine is used to relieve chest pain caused by angina. It is also used to prevent chest pain before activities like climbing stairs, going outdoors in cold weather, or sexual activity. This medicine may be used for other purposes; ask your health care provider or pharmacist if you have questions. COMMON BRAND NAME(S): Nitroquick, Nitrostat, Nitrotab What should I tell my health care provider before I take this medicine? They need to know if you have any of these conditions: -anemia -head injury, recent stroke, or bleeding in the brain -liver disease -previous heart attack -an unusual or allergic reaction to nitroglycerin, other medicines, foods, dyes, or preservatives -pregnant or trying to get pregnant -breast-feeding How should I use this  medicine? Take this medicine by mouth as needed. At the first sign of an angina attack (chest pain or tightness) place one tablet under your tongue. You can also take this medicine 5 to 10 minutes before an event likely to produce chest pain. Follow the directions on the prescription  label. Let the tablet dissolve under the tongue. Do not swallow whole. Replace the dose if you accidentally swallow it. It will help if your mouth is not dry. Saliva around the tablet will help it to dissolve more quickly. Do not eat or drink, smoke or chew tobacco while a tablet is dissolving. If you are not better within 5 minutes after taking ONE dose of nitroglycerin, call 9-1-1 immediately to seek emergency medical care. Do not take more than 3 nitroglycerin tablets over 15 minutes. If you take this medicine often to relieve symptoms of angina, your doctor or health care professional may provide you with different instructions to manage your symptoms. If symptoms do not go away after following these instructions, it is important to call 9-1-1 immediately. Do not take more than 3 nitroglycerin tablets over 15 minutes. Talk to your pediatrician regarding the use of this medicine in children. Special care may be needed. Overdosage: If you think you have taken too much of this medicine contact a poison control center or emergency room at once. NOTE: This medicine is only for you. Do not share this medicine with others. What if I miss a dose? This does not apply. This medicine is only used as needed. What may interact with this medicine? Do not take this medicine with any of the following medications: -certain migraine medicines like ergotamine and dihydroergotamine (DHE) -medicines used to treat erectile dysfunction like sildenafil, tadalafil, and vardenafil -riociguat This medicine may also interact with the following medications: -alteplase -aspirin -heparin -medicines for high blood pressure -medicines for mental depression -other medicines used to treat angina -phenothiazines like chlorpromazine, mesoridazine, prochlorperazine, thioridazine This list may not describe all possible interactions. Give your health care provider a list of all the medicines, herbs, non-prescription drugs, or  dietary supplements you use. Also tell them if you smoke, drink alcohol, or use illegal drugs. Some items may interact with your medicine. What should I watch for while using this medicine? Tell your doctor or health care professional if you feel your medicine is no longer working. Keep this medicine with you at all times. Sit or lie down when you take your medicine to prevent falling if you feel dizzy or faint after using it. Try to remain calm. This will help you to feel better faster. If you feel dizzy, take several deep breaths and lie down with your feet propped up, or bend forward with your head resting between your knees. You may get drowsy or dizzy. Do not drive, use machinery, or do anything that needs mental alertness until you know how this drug affects you. Do not stand or sit up quickly, especially if you are an older patient. This reduces the risk of dizzy or fainting spells. Alcohol can make you more drowsy and dizzy. Avoid alcoholic drinks. Do not treat yourself for coughs, colds, or pain while you are taking this medicine without asking your doctor or health care professional for advice. Some ingredients may increase your blood pressure. What side effects may I notice from receiving this medicine? Side effects that you should report to your doctor or health care professional as soon as possible: -blurred vision -dry mouth -skin rash -sweating -the feeling  of extreme pressure in the head -unusually weak or tired Side effects that usually do not require medical attention (report to your doctor or health care professional if they continue or are bothersome): -flushing of the face or neck -headache -irregular heartbeat, palpitations -nausea, vomiting This list may not describe all possible side effects. Call your doctor for medical advice about side effects. You may report side effects to FDA at 1-800-FDA-1088. Where should I keep my medicine? Keep out of the reach of  children. Store at room temperature between 20 and 25 degrees C (68 and 77 degrees F). Store in [facility] [employer]. Protect from light and moisture. Keep tightly closed. Throw away any unused medicine after the expiration date. NOTE: This sheet is a summary. It may not cover all possible information. If you have questions about this medicine, talk to your doctor, pharmacist, or health care provider.  2018 Elsevier/Gold Standard (2013-06-11 17:57:36)    Coronary Angiogram With Stent Coronary angiogram with stent placement is a procedure to widen or open a narrow blood vessel of the heart (coronary artery). Arteries may become blocked by cholesterol buildup (plaques) in the lining of the wall. When a coronary artery becomes partially blocked, blood flow to that area decreases. This may lead to chest pain or a heart attack (myocardial infarction). A stent is a small piece of metal that looks like mesh or a spring. Stent placement may be done as treatment for a heart attack or right after a coronary angiogram in which a blocked artery is found. Let your health care provider know about:  Any allergies you have.  All medicines you are taking, including vitamins, herbs, eye drops, creams, and over-the-counter medicines.  Any problems you or family members have had with anesthetic medicines.  Any blood disorders you have.  Any surgeries you have had.  Any medical conditions you have.  Whether you are pregnant or may be pregnant. What are the risks? Generally, this is a safe procedure. However, problems may occur, including:  Damage to the heart or its blood vessels.  A return of blockage.  Bleeding, infection, or bruising at the insertion site.  A collection of blood under the skin (hematoma) at the insertion site.  A blood clot in another part of the body.  Kidney injury.  Allergic reaction to the dye or contrast that is used.  Bleeding into the abdomen (retroperitoneal  bleeding).  What happens before the procedure? Staying hydrated Follow instructions from your health care provider about hydration, which may include:  Up to 2 hours before the procedure - you may continue to drink clear liquids, such as water, clear fruit juice, black coffee, and plain tea.  Eating and drinking restrictions Follow instructions from your health care provider about eating and drinking, which may include:  8 hours before the procedure - stop eating heavy meals or foods such as meat, fried foods, or fatty foods.  6 hours before the procedure - stop eating light meals or foods, such as toast or cereal.  2 hours before the procedure - stop drinking clear liquids.  Ask your health care provider about:  Changing or stopping your regular medicines. This is especially important if you are taking diabetes medicines or blood thinners.  Taking medicines such as ibuprofen. These medicines can thin your blood. Do not take these medicines before your procedure if your health care provider instructs you not to. Generally, aspirin is recommended before a procedure of passing a small, thin tube (catheter) through a  blood vessel and into the heart (cardiac catheterization).  What happens during the procedure?  An IV tube will be inserted into one of your veins.  You will be given one or more of the following: ? A medicine to help you relax (sedative). ? A medicine to numb the area where the catheter will be inserted into an artery (local anesthetic).  To reduce your risk of infection: ? Your health care team will wash or sanitize their hands. ? Your skin will be washed with soap. ? Hair may be removed from the area where the catheter will be inserted.  Using a guide wire, the catheter will be inserted into an artery. The location may be in your groin, in your wrist, or in the fold of your arm (near your elbow).  A type of X-ray (fluoroscopy) will be used to help guide the catheter  to the opening of the arteries in the heart.  A dye will be injected into the catheter, and X-rays will be taken. The dye will help to show where any narrowing or blockages are located in the arteries.  A tiny wire will be guided to the blocked spot, and a balloon will be inflated to make the artery wider.  The stent will be expanded and will crush the plaques into the wall of the vessel. The stent will hold the area open and improve the blood flow. Most stents have a drug coating to reduce the risk of the stent narrowing over time.  The artery may be made wider using a drill, laser, or other tools to remove plaques.  When the blood flow is better, the catheter will be removed. The lining of the artery will grow over the stent, which stays where it was placed. This procedure may vary among health care providers and hospitals. What happens after the procedure?  If the procedure is done through the leg, you will be kept in bed lying flat for about 6 hours. You will be instructed to not bend and not cross your legs.  The insertion site will be checked frequently.  The pulse in your foot or wrist will be checked frequently.  You may have additional blood tests, X-rays, and a test that records the electrical activity of your heart (electrocardiogram, or ECG). This information is not intended to replace advice given to you by your health care provider. Make sure you discuss any questions you have with your health care provider. Document Released: 02/17/2003 Document Revised: 04/12/2016 Document Reviewed: 03/18/2016 Elsevier Interactive Patient Education  [employer].

## 2017-10-24 ENCOUNTER — Other Ambulatory Visit: Payer: Self-pay | Admitting: Internal Medicine

## 2017-10-24 DIAGNOSIS — I200 Unstable angina: Secondary | ICD-10-CM

## 2017-10-24 LAB — CBC WITH DIFFERENTIAL/PLATELET
BASOS ABS: 0 10*3/uL (ref 0.0–0.2)
BASOS: 0 %
EOS (ABSOLUTE): 0.2 10*3/uL (ref 0.0–0.4)
Eos: 4 %
HEMOGLOBIN: 15.4 g/dL (ref 13.0–17.7)
Hematocrit: 43.3 % (ref 37.5–51.0)
IMMATURE GRANS (ABS): 0 10*3/uL (ref 0.0–0.1)
Immature Granulocytes: 0 %
LYMPHS ABS: 1.4 10*3/uL (ref 0.7–3.1)
Lymphs: 26 %
MCH: 29.8 pg (ref 26.6–33.0)
MCHC: 35.6 g/dL (ref 31.5–35.7)
MCV: 84 fL (ref 79–97)
MONOCYTES: 8 %
Monocytes Absolute: 0.4 10*3/uL (ref 0.1–0.9)
Neutrophils Absolute: 3.4 10*3/uL (ref 1.4–7.0)
Neutrophils: 62 %
Platelets: 220 10*3/uL (ref 150–379)
RBC: 5.16 x10E6/uL (ref 4.14–5.80)
RDW: 13 % (ref 12.3–15.4)
WBC: 5.6 10*3/uL (ref 3.4–10.8)

## 2017-10-24 LAB — BASIC METABOLIC PANEL
BUN / CREAT RATIO: 14 (ref 9–20)
BUN: 16 mg/dL (ref 6–24)
CHLORIDE: 101 mmol/L (ref 96–106)
CO2: 26 mmol/L (ref 20–29)
Calcium: 10.1 mg/dL (ref 8.7–10.2)
Creatinine, Ser: 1.15 mg/dL (ref 0.76–1.27)
GFR, EST AFRICAN AMERICAN: 80 mL/min/{1.73_m2} (ref >59)
GFR, EST NON AFRICAN AMERICAN: 69 mL/min/{1.73_m2} (ref >59)
Glucose: 100 mg/dL — ABNORMAL HIGH (ref 65–99)
POTASSIUM: 4.9 mmol/L (ref 3.5–5.2)
Sodium: 139 mmol/L (ref 134–144)

## 2017-10-24 LAB — PROTIME-INR
INR: 1 (ref 0.8–1.2)
Prothrombin Time: 10.8 s (ref 9.1–12.0)

## 2017-10-28 ENCOUNTER — Telehealth: Payer: Self-pay | Admitting: *Deleted

## 2017-10-28 NOTE — Telephone Encounter (Signed)
No answer. Left detailed message, ok per DPR, with pre-procedural instructions as listed on AVS for heart cath tomorrow, 10/29/17, and to call back if any further questions.

## 2017-10-29 ENCOUNTER — Ambulatory Visit
Admission: RE | Admit: 2017-10-29 | Discharge: 2017-10-29 | Disposition: A | Discharge status: Home / Self Care | Payer: 59 | Source: Ambulatory Visit | Attending: Internal Medicine | Admitting: Internal Medicine

## 2017-10-29 ENCOUNTER — Encounter
Admission: RE | Disposition: A | Discharge status: Home / Self Care | Payer: Self-pay | Source: Ambulatory Visit | Attending: Internal Medicine

## 2017-10-29 DIAGNOSIS — I200 Unstable angina: Secondary | ICD-10-CM

## 2017-10-29 DIAGNOSIS — Z7982 Long term (current) use of aspirin: Secondary | ICD-10-CM | POA: Insufficient documentation

## 2017-10-29 DIAGNOSIS — I25110 Atherosclerotic heart disease of native coronary artery with unstable angina pectoris: Secondary | ICD-10-CM | POA: Diagnosis not present

## 2017-10-29 DIAGNOSIS — Z8249 Family history of ischemic heart disease and other diseases of the circulatory system: Secondary | ICD-10-CM | POA: Insufficient documentation

## 2017-10-29 DIAGNOSIS — I10 Essential (primary) hypertension: Secondary | ICD-10-CM | POA: Diagnosis not present

## 2017-10-29 DIAGNOSIS — K219 Gastro-esophageal reflux disease without esophagitis: Secondary | ICD-10-CM | POA: Insufficient documentation

## 2017-10-29 DIAGNOSIS — F17290 Nicotine dependence, other tobacco product, uncomplicated: Secondary | ICD-10-CM | POA: Insufficient documentation

## 2017-10-29 DIAGNOSIS — E785 Hyperlipidemia, unspecified: Secondary | ICD-10-CM | POA: Diagnosis not present

## 2017-10-29 DIAGNOSIS — K760 Fatty (change of) liver, not elsewhere classified: Secondary | ICD-10-CM | POA: Diagnosis not present

## 2017-10-29 HISTORY — PX: LEFT HEART CATH AND CORONARY ANGIOGRAPHY: CATH118249

## 2017-10-29 SURGERY — LEFT HEART CATH AND CORONARY ANGIOGRAPHY
Anesthesia: Moderate Sedation | Laterality: Left

## 2017-10-29 MED ORDER — SODIUM CHLORIDE 0.9 % IV SOLN: 250.0000 mL | INTRAVENOUS | Status: DC | PRN

## 2017-10-29 MED ORDER — HEPARIN SODIUM (PORCINE) 1000 UNIT/ML IJ SOLN
INTRAMUSCULAR | Status: DC | PRN
  Administered 2017-10-29: 5000 [IU] via INTRAVENOUS

## 2017-10-29 MED ORDER — FENTANYL CITRATE (PF) 100 MCG/2ML IJ SOLN
INTRAMUSCULAR | Status: DC | PRN
  Administered 2017-10-29: 50 ug via INTRAVENOUS

## 2017-10-29 MED ORDER — SODIUM CHLORIDE 0.9% FLUSH: 3.0000 mL | INTRAVENOUS | Status: DC | PRN

## 2017-10-29 MED ORDER — SODIUM CHLORIDE 0.9 % WEIGHT BASED INFUSION: 1.0000 mL/kg/h | INTRAVENOUS | Status: DC

## 2017-10-29 MED ORDER — SODIUM CHLORIDE 0.9 % WEIGHT BASED INFUSION
3.0000 mL/kg/h | INTRAVENOUS | Status: AC
  Administered 2017-10-29: 3 mL/kg/h via INTRAVENOUS

## 2017-10-29 MED ORDER — MIDAZOLAM HCL 2 MG/2ML IJ SOLN
INTRAMUSCULAR | Status: AC
  Filled 2017-10-29: qty 2

## 2017-10-29 MED ORDER — HEPARIN (PORCINE) IN NACL 2-0.9 UNIT/ML-% IJ SOLN
INTRAMUSCULAR | Status: AC
  Filled 2017-10-29: qty 500

## 2017-10-29 MED ORDER — HEPARIN SODIUM (PORCINE) 1000 UNIT/ML IJ SOLN
INTRAMUSCULAR | Status: AC
  Filled 2017-10-29: qty 1

## 2017-10-29 MED ORDER — SODIUM CHLORIDE 0.9% FLUSH: 3.0000 mL | Freq: Two times a day (BID) | INTRAVENOUS | Status: DC

## 2017-10-29 MED ORDER — VERAPAMIL HCL 2.5 MG/ML IV SOLN
INTRAVENOUS | Status: AC
  Filled 2017-10-29: qty 2

## 2017-10-29 MED ORDER — SODIUM CHLORIDE 0.9 % IV SOLN: INTRAVENOUS | Status: DC

## 2017-10-29 MED ORDER — ASPIRIN 81 MG PO CHEW: 81.0000 mg | CHEWABLE_TABLET | ORAL | Status: DC

## 2017-10-29 MED ORDER — FENTANYL CITRATE (PF) 100 MCG/2ML IJ SOLN
INTRAMUSCULAR | Status: AC
  Filled 2017-10-29: qty 2

## 2017-10-29 MED ORDER — VERAPAMIL HCL 2.5 MG/ML IV SOLN
INTRAVENOUS | Status: DC | PRN
  Administered 2017-10-29: 2.5 mg via INTRA_ARTERIAL

## 2017-10-29 MED ORDER — IOPAMIDOL (ISOVUE-300) INJECTION 61%
INTRAVENOUS | Status: DC | PRN
  Administered 2017-10-29: 40 mL via INTRA_ARTERIAL

## 2017-10-29 MED ORDER — MIDAZOLAM HCL 2 MG/2ML IJ SOLN
INTRAMUSCULAR | Status: DC | PRN
  Administered 2017-10-29: 1 mg via INTRAVENOUS

## 2017-10-29 SURGICAL SUPPLY — 7 items
CATH INFINITI 5 FR JL3.5 (CATHETERS) ×1 IMPLANT
CATH INFINITI JR4 5F (CATHETERS) ×2 IMPLANT
GLIDESHEATH SLEND SS 6F .021 (SHEATH) ×1 IMPLANT
GUIDEWIRE INQWIRE 1.5J.035X260 (WIRE) IMPLANT
INQWIRE 1.5J .035X260CM (WIRE) ×2
KIT MANI 3VAL PERCEP (MISCELLANEOUS) ×2 IMPLANT
PACK CARDIAC CATH (CUSTOM PROCEDURE TRAY) ×2 IMPLANT

## 2017-10-29 NOTE — Interval H&P Note (Signed)
History and Physical Interval Note:  10/29/2017 9:35 AM  Ryan [name].  has presented today for cardiac catheterization, with the diagnosis of unstable angina  The various methods of treatment have been discussed with the patient and family. After consideration of risks, benefits and other options for treatment, the patient has consented to  Procedure(s): LEFT HEART CATH AND CORONARY ANGIOGRAPHY (Left) as a surgical intervention .  The patient's history has been reviewed, patient examined, no change in status, stable for surgery.  I have reviewed the patient's chart and labs.  Questions were answered to the patient's satisfaction.    Cath Lab Visit (complete for each Cath Lab visit)  Clinical Evaluation Leading to the Procedure:   ACS: No.  Non-ACS:    Anginal Classification: CCS II  Anti-ischemic medical therapy: Minimal Therapy (1 class of medications)  Non-Invasive Test Results: No non-invasive testing performed  Prior CABG: No previous CABG  Ryan [name]

## 2017-10-29 NOTE — Brief Op Note (Signed)
BRIEF CARDIAC CATHETERIZATION NOTE  DATE: 10/29/2017 TIME: 10:24 AM  PATIENT:  Ryan [name].  62 y.o. male  PRE-OPERATIVE DIAGNOSIS:  Unstable angina  POST-OPERATIVE DIAGNOSIS:  Same  PROCEDURE:  Procedure(s): LEFT HEART CATH AND CORONARY ANGIOGRAPHY (Left)  SURGEON:  Surgeon(s) and Role:    Nelva Bush, MD - Primary  FINDINGS: 1. No angiographically significant coronary artery disease. 2. Upper normal LVEDP. 3. Normal LVEF.  RECOMMENDATIONS: 1. Medical therapy and primary prevention.  Nelva Bush, MD [facility] Pager: [phone]

## 2017-10-30 ENCOUNTER — Encounter: Payer: Self-pay | Admitting: Internal Medicine

## 2017-11-05 ENCOUNTER — Encounter: Payer: Self-pay | Admitting: Internal Medicine

## 2017-11-06 ENCOUNTER — Telehealth: Payer: Self-pay | Admitting: Internal Medicine

## 2017-11-06 DIAGNOSIS — Z79899 Other long term (current) drug therapy: Secondary | ICD-10-CM

## 2017-11-06 DIAGNOSIS — E785 Hyperlipidemia, unspecified: Secondary | ICD-10-CM

## 2017-11-06 NOTE — Telephone Encounter (Signed)
Spoke with patient to follow-up sx after cath last week. Initial impression was only mild plaquing in the LAD and LCx, though on further review, there appears to be a long segment of narrowing up to 50% in the mid and distal LAD. Ryan [name] reports feeling well except for a rash adjacent to the radial cath site. He believes it was related to an adhesive dressing from the procedure. It has almost resolved at this time. He denies chest pain and would like to begin exercising.  I think it is fine for [name] to begin exercising gradually. I have also recommended that we consider adding a statin. He has been intolerant of atorvastatin in the past due to hand pain. He would like to recheck his cholesterol before retrying a statin. I will have him come in for a fasting lipid panel at his convenience, so that lipid therapy can be readdressed when he follows up with Ignacia Bayley, NP, on 12/02/17.  Nelva Bush, MD [facility] Pager: [phone]

## 2017-11-07 ENCOUNTER — Other Ambulatory Visit
Admission: RE | Admit: 2017-11-07 | Discharge: 2017-11-07 | Disposition: A | Discharge status: Home / Self Care | Payer: 59 | Source: Ambulatory Visit | Attending: Internal Medicine | Admitting: Internal Medicine

## 2017-11-07 DIAGNOSIS — E785 Hyperlipidemia, unspecified: Secondary | ICD-10-CM | POA: Insufficient documentation

## 2017-11-07 DIAGNOSIS — Z79899 Other long term (current) drug therapy: Secondary | ICD-10-CM | POA: Diagnosis not present

## 2017-11-07 LAB — LIPID PANEL
CHOLESTEROL: 264 mg/dL — AB (ref 0–200)
HDL: 49 mg/dL (ref >40)
LDL Cholesterol: 193 mg/dL — ABNORMAL HIGH (ref 0–99)
Total CHOL/HDL Ratio: 5.4 RATIO
Triglycerides: 108 mg/dL (ref <150)
VLDL: 22 mg/dL (ref 0–40)

## 2017-11-07 LAB — ALT: ALT: 54 U/L (ref 17–63)

## 2017-11-07 NOTE — Telephone Encounter (Signed)
S/w patient. He will go later today after fasting for at least 8 hours or on Saturday morning. He knows to go to the [facility].  He knows to go to the [facility]. Orders placed.

## 2017-11-12 ENCOUNTER — Encounter: Payer: Self-pay | Admitting: *Deleted

## 2017-12-02 ENCOUNTER — Ambulatory Visit (INDEPENDENT_AMBULATORY_CARE_PROVIDER_SITE_OTHER): Payer: 59 | Admitting: Nurse Practitioner

## 2017-12-02 ENCOUNTER — Encounter: Payer: Self-pay | Admitting: Nurse Practitioner

## 2017-12-02 VITALS — BP 120/80 | HR 68 | Ht 69.0 in | Wt 222.2 lb

## 2017-12-02 DIAGNOSIS — R0789 Other chest pain: Secondary | ICD-10-CM

## 2017-12-02 DIAGNOSIS — E785 Hyperlipidemia, unspecified: Secondary | ICD-10-CM

## 2017-12-02 DIAGNOSIS — I2510 Atherosclerotic heart disease of native coronary artery without angina pectoris: Secondary | ICD-10-CM

## 2017-12-02 MED ORDER — ROSUVASTATIN CALCIUM 20 MG PO TABS: 20.0000 mg | ORAL_TABLET | Freq: Every day | ORAL | 3 refills | Status: DC

## 2017-12-02 NOTE — Patient Instructions (Signed)
Medication Instructions: - Your physician has recommended you make the following change in your medication:  1) START crestor (rosouvastatin) 20 mg- take 1 tablet by mouth once daily  Labwork: - Your physician recommends that you return for FASTING lab work in: 6 weeks- lipid/ liver  Procedures/Testing: - none ordered  Follow-Up: - Your physician wants you to follow-up in: 6 months with Dr. Saunders Revel. You will receive a reminder letter in the mail two months in advance. If you don't receive a letter, please call our office to schedule the follow-up appointment.   Any Additional Special Instructions Will Be Listed Below (If Applicable).     If you need a refill on your cardiac medications before your next appointment, please call your pharmacy.

## 2017-12-02 NOTE — Progress Notes (Signed)
Office Visit    Patient Name: Ryan [name]. Date of Encounter: 12/02/2017  Primary Care Provider:  Debbrah Alar, NP Primary Cardiologist:  Nelva Bush, MD  Chief Complaint    62 year old male with a history of atypical chest pain, hypertension, hyperlipidemia, GERD, and nonobstructive CAD by recent catheterization, who presents for follow-up.  Past Medical History    Past Medical History:  Diagnosis Date  . Abnormal liver function    history of   . Atypical chest pain   . Blind left eye    blind left eye since birth  . Cancer ([place])    skin:  basal cell carcinoma  . Diastolic dysfunction    I.01/2702 Echo: EF 55-60%, Gr1 DD, triv MR.  . Fatty liver    fatty liver disease  . GERD (gastroesophageal reflux disease)   . Hyperlipidemia   . Hypertension   . Non-obstructive CAD (coronary artery disease)    a. 2016 Neg MV; b. 10/2017 Cath: LM nl, LAD 62m, D1/2/3 nl, LCX 20p, RCA nl, EF 50-55%.   Past Surgical History:  Procedure Laterality Date  . LEFT HEART CATH AND CORONARY ANGIOGRAPHY Left 10/29/2017   Procedure: LEFT HEART CATH AND CORONARY ANGIOGRAPHY;  Surgeon: Nelva Bush, MD;  Location: [facility];  Service: Cardiovascular;  Laterality: Left;  . TONSILLECTOMY      Allergies  Allergies  Allergen Reactions  . Atorvastatin Other (See Comments)    Hands swelled    History of Present Illness    62 year old male with the above complex past medical history including hypertension, hyperlipidemia, GERD, hepatic steatosis, and atypical chest pain with previous negative ischemic evaluation in 2016.  Echocardiogram in early 2018 showed normal LV function.  He recently followed up with Dr. Saunders Revel  in the setting of exertional substernal chest pain and tightness associated with dyspnea, palpitations, and lightheadedness.  He subsequently underwent diagnostic catheterization which showed nonobstructive CAD.  Medical therapy was recommended.  Follow-up  lipids in mid March showed significant hyperlipidemia with a total cholesterol of 264 and an LDL of 193.  We recommended initiation of rosuvastatin therapy in the setting of prior intolerance to atorvastatin.  We reached out to him via email but he says today that he does not check email.  Since his catheterization, he has done well.  He is working full-time and denies any chest pain, dyspnea, palpitations, PND, orthopnea, dizziness, syncope, edema, or early satiety.  Home Medications    Prior to Admission medications   Medication Sig Start Date End Date Taking? Authorizing Provider  aspirin EC 81 MG tablet Take 1 tablet (81 mg total) by mouth daily. 10/23/16  Yes End, Harrell Gave, MD  lisinopril (PRINIVIL,ZESTRIL) 10 MG tablet TAKE 1 TABLET BY MOUTH DAILY Patient taking differently: TAKE 10 MG BY MOUTH DAILY 10/14/17  Yes End, Harrell Gave, MD  Multiple Vitamins-Minerals (OCUVITE PO) Take 1 capsule by mouth daily.   Yes [provider]  nitroGLYCERIN (NITROSTAT) 0.4 MG SL tablet Place 1 tablet (0.4 mg total) under the tongue every 5 (five) minutes as needed for chest pain. Maximum of 3 doses. 10/23/17 01/21/18 Yes End, Harrell Gave, MD  Omega-3 Fatty Acids (FISH OIL) 1200 MG CAPS Take 1,200 mg by mouth 2 (two) times daily.    Yes [provider]  rosuvastatin (CRESTOR) 20 MG tablet Take 1 tablet (20 mg total) by mouth daily. 12/02/17 03/02/18  Theora Gianotti, NP    Review of Systems    He denies chest  pain, palpitations, dyspnea, pnd, orthopnea, n, v, dizziness, syncope, edema, weight gain, or early satiety.  All other systems reviewed and are otherwise negative except as noted above.  Physical Exam    VS:  BP 120/80 (BP Location: Left Arm, Patient Position: Sitting, Cuff Size: Normal)   Pulse 68   Ht 5\' 9"  (1.753 m)   Wt 222 lb 4 oz (100.8 kg)   BMI 32.82 kg/m  , BMI Body mass index is 32.82 kg/m. GEN: Well nourished, well developed, in no acute distress.  HEENT:  normal.  Neck: Supple, no JVD, carotid bruits, or masses. Cardiac: RRR, no murmurs, rubs, or gallops. No clubbing, cyanosis, edema.  Radials/DP/PT 2+ and equal bilaterally.  Respiratory:  Respirations regular and unlabored, clear to auscultation bilaterally. GI: Soft, nontender, nondistended, BS + x 4. MS: no deformity or atrophy. Skin: warm and dry, no rash. Neuro:  Strength and sensation are intact. Psych: Normal affect.  Accessory Clinical Findings    Lab Results  Component Value Date   CHOL 264 (H) 11/07/2017   HDL 49 11/07/2017   LDLCALC 193 (H) 11/07/2017   LDLDIRECT 144.3 11/17/2007   TRIG 108 11/07/2017   CHOLHDL 5.4 11/07/2017   Diagnostic catheterization reviewed with details placed in the past medical history.  Reviewed with patient in detail as well.  Assessment & Plan    1.  Chest pain/nonobstructive CAD: Status post recent diagnostic catheterization revealing nonobstructive CAD including moderate distal LAD and mild proximal circumflex disease.  Normal LV function.  He has had no recurrent symptoms since his catheterization.  Continue medical therapy with aspirin.  In the setting of significant hyperlipidemia, we did discuss management at length today.  Though he prefers to avoid statin therapy, he is willing to try rosuvastatin 20 mg daily.  I will add this today and follow-up lipids and LFTs in 6 weeks.  Of note, he had previously been on beta-blocker therapy but following his catheterization it was his understanding that he could stop it.  He has been doing fine off of it.  Blood pressure 120/80.  He will not resume.  2.  Essential hypertension: As above, stable off of beta-blocker.  Continue lisinopril 10 mg daily.  3.  Hyperlipidemia: Total cholesterol 264 with an LDL of 193.  We discussed guidelines for medical management of hyperlipidemia, especially in the setting of LDL greater than 190 and with evidence of coronary artery disease.  He had previously developed hand  aches and swelling on Lipitor but is willing to try rosuvastatin 20.  We have prescribed this for him today and will plan to follow-up lipids and LFTs in 6 weeks.  4.  Disposition: Follow-up lipids and LFTs in 6 weeks.  Follow-up with Dr. Saunders Revel in 6 months or sooner if necessary.   Murray Hodgkins, NP 12/02/2017, 4:14 PM

## 2018-01-07 ENCOUNTER — Encounter: Payer: Self-pay | Admitting: Family

## 2018-02-07 ENCOUNTER — Ambulatory Visit (INDEPENDENT_AMBULATORY_CARE_PROVIDER_SITE_OTHER): Payer: 59 | Admitting: Family

## 2018-02-07 VITALS — BP 138/73 | HR 66 | Temp 98.4°F | Resp 18 | Wt 224.6 lb

## 2018-02-07 DIAGNOSIS — Z0000 Encounter for general adult medical examination without abnormal findings: Secondary | ICD-10-CM | POA: Diagnosis not present

## 2018-02-07 DIAGNOSIS — K219 Gastro-esophageal reflux disease without esophagitis: Secondary | ICD-10-CM | POA: Diagnosis not present

## 2018-02-07 DIAGNOSIS — L989 Disorder of the skin and subcutaneous tissue, unspecified: Secondary | ICD-10-CM

## 2018-02-07 MED ORDER — LISINOPRIL 10 MG PO TABS: 10.0000 mg | ORAL_TABLET | Freq: Every day | ORAL | 3 refills | Status: DC

## 2018-02-07 MED ORDER — ROSUVASTATIN CALCIUM 20 MG PO TABS: 20.0000 mg | ORAL_TABLET | Freq: Every day | ORAL | 3 refills | Status: DC

## 2018-02-07 NOTE — Progress Notes (Signed)
Subjective:    Patient ID: Ryan [name]., male    DOB: [birth year], 62 y.o.   MRN: [id number]  HPI  Patient presents today for complete physical.  Immunizations: tetanus 2013 Diet:   Wt Readings from Last 3 Encounters:  02/07/18 224 lb 9.6 oz (101.9 kg)  12/02/17 222 lb 4 oz (100.8 kg)  10/29/17 219 lb (99.3 kg)  Exercise: not exercising Vision: 1 yr ago Dental:  1 year ago  GERD- notes some gerd symptoms, declines PPI.  Review of Systems  Constitutional: Positive for unexpected weight change.  HENT: Negative for hearing loss and rhinorrhea.   Eyes: Negative for visual disturbance.  Respiratory:       Mild cough due to lisinopril  Cardiovascular: Negative for chest pain and leg swelling.  Skin: Negative for rash.  Neurological: Negative for headaches.  Hematological: Negative for adenopathy.  Psychiatric/Behavioral:       Denies depression/anxiety   Past Medical History:  Diagnosis Date  . Abnormal liver function    history of   . Atypical chest pain   . Blind left eye    blind left eye since birth  . Cancer ([place])    skin:  basal cell carcinoma  . Diastolic dysfunction    L.03/7563 Echo: EF 55-60%, Gr1 DD, triv MR.  . Fatty liver    fatty liver disease  . GERD (gastroesophageal reflux disease)   . Hyperlipidemia   . Hypertension   . Non-obstructive CAD (coronary artery disease)    a. 2016 Neg MV; b. 10/2017 Cath: LM nl, LAD 3m, D1/2/3 nl, LCX 20p, RCA nl, EF 50-55%.     Social History   Socioeconomic History  . Marital status: Married    Spouse name: Not on file  . Number of children: 1  . Years of education: Not on file  . Highest education level: Not on file  Occupational History  . Occupation: Designer, industrial/product: [employer]  . Financial resource strain: Not on file  . Food insecurity:    Worry: Not on file    Inability: Not on file  . Transportation needs:    Medical: Not on file    Non-medical: Not on file    Tobacco Use  . Smoking status: Current Some Day Smoker    Types: Cigars    Last attempt to quit: 11/26/2014    Years since quitting: 3.2  . Smokeless tobacco: Never Used  . Tobacco comment: smokes occasional cigar on vacation  Substance and Sexual Activity  . Alcohol use: Yes    Alcohol/week: 1.2 oz    Types: 1 Cans of beer, 1 Standard drinks or equivalent per week  . Drug use: No  . Sexual activity: Not on file  Lifestyle  . Physical activity:    Days per week: Not on file    Minutes per session: Not on file  . Stress: Not on file  Relationships  . Social connections:    Talks on phone: Not on file    Gets together: Not on file    Attends religious service: Not on file    Active member of club or organization: Not on file    Attends meetings of clubs or organizations: Not on file    Relationship status: Not on file  . Intimate partner violence:    Fear of current or ex partner: Not on file    Emotionally abused: Not on file  Physically abused: Not on file    Forced sexual activity: Not on file  Other Topics Concern  . Not on file  Social History Narrative       Past Surgical History:  Procedure Laterality Date  . LEFT HEART CATH AND CORONARY ANGIOGRAPHY Left 10/29/2017   Procedure: LEFT HEART CATH AND CORONARY ANGIOGRAPHY;  Surgeon: Nelva Bush, MD;  Location: [facility];  Service: Cardiovascular;  Laterality: Left;  . TONSILLECTOMY      Family History  Problem Relation Age of Onset  . Colon cancer Mother        deceased age 79  . Diabetes Mother   . Hypertension Mother   . Lung cancer Father        deceased age 53  . Heart disease Father        Bypass in mid-late 6's  . Heart attack Father        56    Allergies  Allergen Reactions  . Atorvastatin Other (See Comments)    Hands swelled    Current Outpatient Medications on File Prior to Visit  Medication Sig Dispense Refill  . aspirin EC 81 MG tablet Take 1 tablet (81 mg total) by  mouth daily. 90 tablet 3  . Multiple Vitamins-Minerals (OCUVITE PO) Take 1 capsule by mouth daily.    . Omega-3 Fatty Acids (FISH OIL) 1200 MG CAPS Take 4,800 mg by mouth daily.     . nitroGLYCERIN (NITROSTAT) 0.4 MG SL tablet Place 1 tablet (0.4 mg total) under the tongue every 5 (five) minutes as needed for chest pain. Maximum of 3 doses. 35 tablet 1   No current facility-administered medications on file prior to visit.     BP 138/73 (BP Location: Left Arm, Patient Position: Sitting, Cuff Size: Normal)   Pulse 66   Temp 98.4 F (36.9 C) (Oral)   Resp 18   Wt 224 lb 9.6 oz (101.9 kg)   SpO2 98%   BMI 33.17 kg/m       Objective:   Physical Exam  Physical Exam  Constitutional: He is oriented to person, place, and time. He appears well-developed and well-nourished. No distress.  HENT:  Head: Normocephalic and atraumatic.  Right Ear: Tympanic membrane and ear canal normal.  Left Ear: Tympanic membrane and ear canal normal.  Mouth/Throat: Oropharynx is clear and moist.  Eyes: Pupils are equal, round, and reactive to light. No scleral icterus.  Neck: Normal range of motion. No thyromegaly present.  Cardiovascular: Normal rate and regular rhythm.   No murmur heard. Pulmonary/Chest: Effort normal and breath sounds normal. No respiratory distress. He has no wheezes. He has no rales. He exhibits no tenderness.  Abdominal: Soft. Bowel sounds are normal. He exhibits no distension and no mass. There is no tenderness. There is no rebound and no guarding.  Musculoskeletal: He exhibits no edema.  Lymphadenopathy:    He has no cervical adenopathy.  Neurological: He is alert and oriented to person, place, and time. He has normal patellar reflexes. He exhibits normal muscle tone. Coordination normal.  Skin: Skin is warm and dry. some small nevi noted on trunk (new per patient) Psychiatric: He has a normal mood and affect. His behavior is normal. Judgment and thought content normal.            Assessment & Plan:   Preventative care- discussed healthy diet, exercise, weight loss. Obtain routine lab work. Tetanus up to date. Shingrix is unavailable.   GERD-Uncontrolled. We discussed gerd  diet.   Skin lesions- refer to dermatology.     Assessment & Plan:

## 2018-02-07 NOTE — Patient Instructions (Addendum)
Please complete lab work prior to leaving. You should be contacted about scheduling your sleep study, colonoscopy and dermatology.  Choices for Gastroesophageal Reflux Disease, Adult When you have gastroesophageal reflux disease (GERD), the foods you eat and your eating habits are very important. Choosing the right foods can help ease your discomfort. What guidelines do I need to follow?  Choose fruits, vegetables, whole grains, and low-fat dairy products.  Choose low-fat meat, fish, and poultry.  Limit fats such as oils, salad dressings, butter, nuts, and avocado.  Keep a food diary. This helps you identify foods that cause symptoms.  Avoid foods that cause symptoms. These may be different for everyone.  Eat small meals often instead of 3 large meals a day.  Eat your meals slowly, in a place where you are relaxed.  Limit fried foods.  Cook foods using methods other than frying.  Avoid drinking alcohol.  Avoid drinking large amounts of liquids with your meals.  Avoid bending over or lying down until 2-3 hours after eating. What foods are not recommended? These are some foods and drinks that may make your symptoms worse: Vegetables Tomatoes. Tomato juice. Tomato and spaghetti sauce. Chili peppers. Onion and garlic. Horseradish. Fruits Oranges, grapefruit, and lemon (fruit and juice). Meats High-fat meats, fish, and poultry. This includes hot dogs, ribs, ham, sausage, salami, and bacon. Dairy Whole milk and chocolate milk. Sour cream. Cream. Butter. Ice cream. Cream cheese. Drinks Coffee and tea. Bubbly (carbonated) drinks or energy drinks. Condiments Hot sauce. Barbecue sauce. Sweets/Desserts Chocolate and cocoa. Donuts. Peppermint and spearmint. Fats and Oils High-fat foods. This includes [place] fries and potato chips. Other Vinegar. Strong spices. This includes black pepper, white pepper, red pepper, cayenne, curry powder, cloves, ginger, and chili powder. The items  listed above may not be a complete list of foods and drinks to avoid. Contact your dietitian for more information. This information is not intended to replace advice given to you by your health care provider. Make sure you discuss any questions you have with your health care provider. Document Released: 02/12/2012 Document Revised: 01/19/2016 Document Reviewed: 06/17/2013 Elsevier Interactive Patient Education  2017 [employer].

## 2018-02-08 ENCOUNTER — Encounter: Payer: Self-pay | Admitting: Family

## 2018-02-08 LAB — CBC WITH DIFFERENTIAL/PLATELET
BASOS PCT: 0.7 %
Basophils Absolute: 50 cells/uL (ref 0–200)
EOS ABS: 426 {cells}/uL (ref 15–500)
Eosinophils Relative: 6 %
HCT: 43.2 % (ref 38.5–50.0)
HEMOGLOBIN: 15.1 g/dL (ref 13.2–17.1)
Lymphs Abs: 2002 cells/uL (ref 850–3900)
MCH: 30.1 pg (ref 27.0–33.0)
MCHC: 35 g/dL (ref 32.0–36.0)
MCV: 86.2 fL (ref 80.0–100.0)
MONOS PCT: 7.8 %
MPV: 11.3 fL (ref 7.5–12.5)
NEUTROS ABS: 4068 {cells}/uL (ref 1500–7800)
Neutrophils Relative %: 57.3 %
PLATELETS: 199 10*3/uL (ref 140–400)
RBC: 5.01 10*6/uL (ref 4.20–5.80)
RDW: 12.3 % (ref 11.0–15.0)
TOTAL LYMPHOCYTE: 28.2 %
WBC: 7.1 10*3/uL (ref 3.8–10.8)
WBCMIX: 554 {cells}/uL (ref 200–950)

## 2018-02-08 LAB — LIPID PANEL
CHOLESTEROL: 135 mg/dL (ref <200)
HDL: 46 mg/dL (ref >40)
LDL Cholesterol (Calc): 69 mg/dL (calc)
Non-HDL Cholesterol (Calc): 89 mg/dL (calc) (ref <130)
Total CHOL/HDL Ratio: 2.9 (calc) (ref <5.0)
Triglycerides: 117 mg/dL (ref <150)

## 2018-02-08 LAB — HEPATIC FUNCTION PANEL
AG Ratio: 1.8 (calc) (ref 1.0–2.5)
ALBUMIN MSPROF: 4.6 g/dL (ref 3.6–5.1)
ALKALINE PHOSPHATASE (APISO): 60 U/L (ref 40–115)
ALT: 30 U/L (ref 9–46)
AST: 27 U/L (ref 10–35)
BILIRUBIN DIRECT: 0.1 mg/dL (ref 0.0–0.2)
BILIRUBIN INDIRECT: 0.5 mg/dL (ref 0.2–1.2)
BILIRUBIN TOTAL: 0.6 mg/dL (ref 0.2–1.2)
Globulin: 2.6 g/dL (calc) (ref 1.9–3.7)
Total Protein: 7.2 g/dL (ref 6.1–8.1)

## 2018-02-08 LAB — BASIC METABOLIC PANEL
BUN / CREAT RATIO: 17 (calc) (ref 6–22)
BUN: 22 mg/dL (ref 7–25)
CALCIUM: 10 mg/dL (ref 8.6–10.3)
CHLORIDE: 102 mmol/L (ref 98–110)
CO2: 28 mmol/L (ref 20–32)
Creat: 1.3 mg/dL — ABNORMAL HIGH (ref 0.70–1.25)
GLUCOSE: 95 mg/dL (ref 65–99)
POTASSIUM: 5.2 mmol/L (ref 3.5–5.3)
SODIUM: 139 mmol/L (ref 135–146)

## 2018-02-08 LAB — URINALYSIS, ROUTINE W REFLEX MICROSCOPIC
Bilirubin Urine: NEGATIVE
Glucose, UA: NEGATIVE
Hgb urine dipstick: NEGATIVE
Ketones, ur: NEGATIVE
LEUKOCYTES UA: NEGATIVE
Nitrite: NEGATIVE
Protein, ur: NEGATIVE
SPECIFIC GRAVITY, URINE: 1.024 (ref 1.001–1.03)
pH: <=5 (ref 5.0–8.0)

## 2018-02-08 LAB — TSH: TSH: 1.74 mIU/L (ref 0.40–4.50)

## 2018-02-13 DIAGNOSIS — D2262 Melanocytic nevi of left upper limb, including shoulder: Secondary | ICD-10-CM | POA: Diagnosis not present

## 2018-02-13 DIAGNOSIS — Z85828 Personal history of other malignant neoplasm of skin: Secondary | ICD-10-CM | POA: Diagnosis not present

## 2018-02-13 DIAGNOSIS — D2261 Melanocytic nevi of right upper limb, including shoulder: Secondary | ICD-10-CM | POA: Diagnosis not present

## 2018-03-18 ENCOUNTER — Ambulatory Visit: Payer: Self-pay | Admitting: *Deleted

## 2018-03-18 NOTE — Telephone Encounter (Signed)
Pt reports diarrhea onset Saturday. States 4-5 episodes Sat and Sun, "Eased off Sunday night." States no diarrhea Monday "But ate very little." States "Returned today, 4 episodes since 7am".  States is staying hydrated. Denies dizziness, abdominal pain; "Some cramping right before I go to [place]." Also reports intermittent lower back pain, "Feels muscular, probably from sitting on the commode." Denies dysuria, does not radiate. Afebrile.States took an Imodium 2 hours ago; advised on dosage. Home Care advise given per protocol; instructed to call back if symptoms worsen, does not resolve within 3 days. Reason for Disposition . MILD-MODERATE diarrhea (e.g., 1-6 times / day more than normal)  Answer Assessment - Initial Assessment Questions 1. DIARRHEA SEVERITY: "How bad is the diarrhea?" "How many extra stools have you had in the past 24 hours than normal?"    - NO DIARRHEA (SCALE 0)   - MILD (SCALE 1-3): Few loose or mushy BMs; increase of 1-3 stools over normal daily number of stools; mild increase in ostomy output.   -  MODERATE (SCALE 4-7): Increase of 4-6 stools daily over normal; moderate increase in ostomy output. * SEVERE (SCALE 8-10; OR 'WORST POSSIBLE'): Increase of 7 or more stools daily over normal; moderate increase in ostomy output; incontinence.     Moderate  4 since 0700 2. ONSET: "When did the diarrhea begin?"      Saturday and Sunday : eased off Sunday night; none Monday;  Started again 3. BM CONSISTENCY: "How loose or watery is the diarrhea?"      Loose, kind of watery 4. VOMITING: "Are you also vomiting?" If so, ask: "How many times in the past 24 hours?"      no 5. ABDOMINAL PAIN: "Are you having any abdominal pain?" If yes: "What does it feel like?" (e.g., crampy, dull, intermittent, constant)      no 6. ABDOMINAL PAIN SEVERITY: If present, ask: "How bad is the pain?"  (e.g., Scale 1-10; mild, moderate, or severe)   - MILD (1-3): doesn't interfere with normal activities, abdomen  soft and not tender to touch    - MODERATE (4-7): interferes with normal activities or awakens from sleep, tender to touch    - SEVERE (8-10): excruciating pain, doubled over, unable to do any normal activities       N/A 7. ORAL INTAKE: If vomiting, "Have you been able to drink liquids?" "How much fluids have you had in the past 24 hours?"     yes 8. HYDRATION: "Any signs of dehydration?" (e.g., dry mouth [not just dry lips], too weak to stand, dizziness, new weight loss) "When did you last urinate?"     no 9. EXPOSURE: "Have you traveled to a foreign country recently?" "Have you been exposed to anyone with diarrhea?" "Could you have eaten any food that was spoiled?"     no 10. ANTIBIOTIC USE: "Are you taking antibiotics now or have you taken antibiotics in the past 2 months?"       no 11. OTHER SYMPTOMS: "Do you have any other symptoms?" (e.g., fever, blood in stool)      Lower back pain, 'feels muscular" Does not radiate  Protocols used: DIARRHEA-A-AH

## 2018-03-18 NOTE — Telephone Encounter (Signed)
Called patient three times this morning to talk with him regarding his diarrhea. Unable to reach patient, left message 3 times. Last time left message for him to given the office a call back so we can help him with his problem.

## 2018-04-16 ENCOUNTER — Encounter: Payer: Self-pay | Admitting: Family

## 2018-05-12 ENCOUNTER — Encounter: Payer: Self-pay | Admitting: Family

## 2018-05-12 ENCOUNTER — Other Ambulatory Visit: Payer: Self-pay | Admitting: Family

## 2018-05-12 MED ORDER — AMLODIPINE BESYLATE 5 MG PO TABS: 5.0000 mg | ORAL_TABLET | Freq: Every day | ORAL | 3 refills | Status: DC

## 2018-05-12 NOTE — Telephone Encounter (Signed)
OK to remain off of lisinopril and begin amlodipine once daily. Follow up in 2 weeks with me and we can recheck BP and discuss GI issues.

## 2018-05-12 NOTE — Telephone Encounter (Signed)
Notified pt and he voices understanding. Appointment scheduled for 05/26/18 at 3:20pm.

## 2018-05-26 ENCOUNTER — Ambulatory Visit (INDEPENDENT_AMBULATORY_CARE_PROVIDER_SITE_OTHER): Payer: 59 | Admitting: Family

## 2018-05-26 ENCOUNTER — Encounter: Payer: Self-pay | Admitting: Family

## 2018-05-26 VITALS — BP 138/88 | HR 75 | Temp 98.2°F | Resp 16 | Ht 69.0 in | Wt 228.8 lb

## 2018-05-26 DIAGNOSIS — K5900 Constipation, unspecified: Secondary | ICD-10-CM | POA: Diagnosis not present

## 2018-05-26 DIAGNOSIS — Z23 Encounter for immunization: Secondary | ICD-10-CM

## 2018-05-26 DIAGNOSIS — Z0000 Encounter for general adult medical examination without abnormal findings: Secondary | ICD-10-CM

## 2018-05-26 DIAGNOSIS — I10 Essential (primary) hypertension: Secondary | ICD-10-CM

## 2018-05-26 NOTE — Patient Instructions (Addendum)
Start taking amlodipine at bedtime. Let me know if your headaches do not improve.  Add metamucil once daily. Increase water.

## 2018-05-26 NOTE — Progress Notes (Signed)
Subjective:    Patient ID: Ryan [name]., male    DOB: 1956-01-07, 62 y.o.   MRN: [id number]  HPI  Patient presents today for follow up of  His blood pressure. He requested on 9/16 that his lisinopril be switched due to concern that it may be causing him GI side effects.  Instead he was placed on amlodipine 5mg . Reports that he has been waking up with a HA since he started amlodipine.     BP Readings from Last 3 Encounters:  05/26/18 138/88  02/07/18 138/73  12/02/17 120/80   Reports some abdominal discomfort, firm large hard stools. Last bm yesterday. Does not drink much water.   Review of Systems    see HPI  Past Medical History:  Diagnosis Date  . Abnormal liver function    history of   . Atypical chest pain   . Blind left eye    blind left eye since birth  . Cancer ([place])    skin:  basal cell carcinoma  . Diastolic dysfunction    X.08/624 Echo: EF 55-60%, Gr1 DD, triv MR.  . Fatty liver    fatty liver disease  . GERD (gastroesophageal reflux disease)   . Hyperlipidemia   . Hypertension   . Non-obstructive CAD (coronary artery disease)    a. 2016 Neg MV; b. 10/2017 Cath: LM nl, LAD 17m, D1/2/3 nl, LCX 20p, RCA nl, EF 50-55%.     Social History   Socioeconomic History  . Marital status: Married    Spouse name: Not on file  . Number of children: 1  . Years of education: Not on file  . Highest education level: Not on file  Occupational History  . Occupation: Designer, industrial/product: [employer]  . Financial resource strain: Not on file  . Food insecurity:    Worry: Not on file    Inability: Not on file  . Transportation needs:    Medical: Not on file    Non-medical: Not on file  Tobacco Use  . Smoking status: Current Some Day Smoker    Types: Cigars    Last attempt to quit: 11/26/2014    Years since quitting: 3.4  . Smokeless tobacco: Never Used  . Tobacco comment: smokes occasional cigar on vacation  Substance and Sexual  Activity  . Alcohol use: Yes    Alcohol/week: 2.0 standard drinks    Types: 1 Cans of beer, 1 Standard drinks or equivalent per week  . Drug use: No  . Sexual activity: Not on file  Lifestyle  . Physical activity:    Days per week: Not on file    Minutes per session: Not on file  . Stress: Not on file  Relationships  . Social connections:    Talks on phone: Not on file    Gets together: Not on file    Attends religious service: Not on file    Active member of club or organization: Not on file    Attends meetings of clubs or organizations: Not on file    Relationship status: Not on file  . Intimate partner violence:    Fear of current or ex partner: Not on file    Emotionally abused: Not on file    Physically abused: Not on file    Forced sexual activity: Not on file  Other Topics Concern  . Not on file  Social History Narrative       Past Surgical  History:  Procedure Laterality Date  . LEFT HEART CATH AND CORONARY ANGIOGRAPHY Left 10/29/2017   Procedure: LEFT HEART CATH AND CORONARY ANGIOGRAPHY;  Surgeon: Nelva Bush, MD;  Location: [facility];  Service: Cardiovascular;  Laterality: Left;  . TONSILLECTOMY      Family History  Problem Relation Age of Onset  . Colon cancer Mother        deceased age 8  . Diabetes Mother   . Hypertension Mother   . Lung cancer Father        deceased age 14  . Heart disease Father        Bypass in mid-late 37's  . Heart attack Father        68    Allergies  Allergen Reactions  . Atorvastatin Other (See Comments)    Hands swelled    Current Outpatient Medications on File Prior to Visit  Medication Sig Dispense Refill  . amLODipine (NORVASC) 5 MG tablet Take 1 tablet (5 mg total) by mouth daily. 30 tablet 3  . aspirin EC 81 MG tablet Take 1 tablet (81 mg total) by mouth daily. 90 tablet 3  . Multiple Vitamins-Minerals (OCUVITE PO) Take 1 capsule by mouth daily.    . nitroGLYCERIN (NITROSTAT) 0.4 MG SL tablet Place  1 tablet (0.4 mg total) under the tongue every 5 (five) minutes as needed for chest pain. Maximum of 3 doses. 35 tablet 1  . Omega-3 Fatty Acids (FISH OIL) 1200 MG CAPS Take 4,800 mg by mouth daily.     . rosuvastatin (CRESTOR) 20 MG tablet Take 1 tablet (20 mg total) by mouth daily. 90 tablet 3   No current facility-administered medications on file prior to visit.     BP 138/88 (BP Location: Right Arm, Patient Position: Sitting, Cuff Size: Large)   Pulse 75   Temp 98.2 F (36.8 C) (Oral)   Resp 16   Ht 5\' 9"  (1.753 m)   Wt 228 lb 12.8 oz (103.8 kg)   SpO2 97%   BMI 33.79 kg/m    Objective:   Physical Exam  Constitutional: He is oriented to person, place, and time. He appears well-developed and well-nourished. No distress.  HENT:  Head: Normocephalic and atraumatic.  Cardiovascular: Normal rate and regular rhythm.  No murmur heard. Pulmonary/Chest: Effort normal and breath sounds normal. No respiratory distress. He has no wheezes. He has no rales.  Abdominal:  Mild abdominal distension, non-tender, no masses  Musculoskeletal: He exhibits no edema.  Neurological: He is alert and oriented to person, place, and time.  Skin: Skin is warm and dry.  Psychiatric: He has a normal mood and affect. His behavior is normal. Thought content normal.          Assessment & Plan:  Flu shot today.    Constipation- uncontrolled. encouraged daily metamucil and increase water.  HTN- BP stable on current medication. He notes AM HA. Recommend that he try amlodipine at bedtime and if still having HA in AM then let me know and I will change to toprol xl.   He will schedule colo- I will replace referral.

## 2018-08-04 ENCOUNTER — Encounter: Payer: Self-pay | Admitting: Internal Medicine

## 2018-08-22 ENCOUNTER — Ambulatory Visit (AMBULATORY_SURGERY_CENTER): Payer: Self-pay

## 2018-08-22 VITALS — Ht 69.0 in | Wt 230.0 lb

## 2018-08-22 DIAGNOSIS — Z86010 Personal history of colonic polyps: Secondary | ICD-10-CM

## 2018-08-22 DIAGNOSIS — Z800 Family history of malignant neoplasm of digestive organs: Secondary | ICD-10-CM

## 2018-08-22 MED ORDER — NA SULFATE-K SULFATE-MG SULF 17.5-3.13-1.6 GM/177ML PO SOLN: 1.0000 | Freq: Once | ORAL | 0 refills | Status: AC

## 2018-08-22 NOTE — Progress Notes (Signed)
Per pt, no allergies to soy or egg products.Pt not taking any weight loss meds or using  O2 at home.  Pt refused emmi video. 

## 2018-08-25 ENCOUNTER — Encounter: Payer: Self-pay | Admitting: Internal Medicine

## 2018-09-05 ENCOUNTER — Encounter: Payer: Self-pay | Admitting: Internal Medicine

## 2018-09-05 ENCOUNTER — Ambulatory Visit (AMBULATORY_SURGERY_CENTER): Payer: 59 | Admitting: Internal Medicine

## 2018-09-05 VITALS — BP 113/59 | HR 72 | Temp 99.1°F | Resp 11 | Ht 69.0 in | Wt 230.0 lb

## 2018-09-05 DIAGNOSIS — K621 Rectal polyp: Secondary | ICD-10-CM | POA: Diagnosis not present

## 2018-09-05 DIAGNOSIS — Z800 Family history of malignant neoplasm of digestive organs: Secondary | ICD-10-CM

## 2018-09-05 DIAGNOSIS — D128 Benign neoplasm of rectum: Secondary | ICD-10-CM

## 2018-09-05 DIAGNOSIS — K635 Polyp of colon: Secondary | ICD-10-CM | POA: Diagnosis not present

## 2018-09-05 DIAGNOSIS — D122 Benign neoplasm of ascending colon: Secondary | ICD-10-CM

## 2018-09-05 DIAGNOSIS — D124 Benign neoplasm of descending colon: Secondary | ICD-10-CM

## 2018-09-05 DIAGNOSIS — Z1211 Encounter for screening for malignant neoplasm of colon: Secondary | ICD-10-CM | POA: Diagnosis not present

## 2018-09-05 DIAGNOSIS — D120 Benign neoplasm of cecum: Secondary | ICD-10-CM

## 2018-09-05 MED ORDER — SODIUM CHLORIDE 0.9 % IV SOLN: 500.0000 mL | Freq: Once | INTRAVENOUS | Status: DC

## 2018-09-05 NOTE — Progress Notes (Signed)
Called to room to assist during endoscopic procedure.  Patient ID and intended procedure confirmed with present staff. Received instructions for my participation in the procedure from the performing physician.  

## 2018-09-05 NOTE — Progress Notes (Signed)
Report to PACU, RN, vss, BBS= Clear.  

## 2018-09-05 NOTE — Op Note (Signed)
[facility] Patient Name: Ryan [name] Procedure Date: 09/05/2018 12:54 PM MRN: [id number] Endoscopist: Docia Chuck. Henrene Pastor , MD Age: 63 Referring MD:  Date of Birth: [birth year] Gender: Male Account #: [id number] Procedure:                Colonoscopy with cold snare polypectomy x 3 Indications:              Screening in patient at increased risk: Colorectal                            cancer in mother before age 17. Prioe negative                            exams (Dr. Deatra Ina) 2005, 2013 Medicines:                Monitored Anesthesia Care Procedure:                Pre-Anesthesia Assessment:                           - Prior to the procedure, a History and Physical                            was performed, and patient medications and                            allergies were reviewed. The patient's tolerance of                            previous anesthesia was also reviewed. The risks                            and benefits of the procedure and the sedation                            options and risks were discussed with the patient.                            All questions were answered, and informed consent                            was obtained. Prior Anticoagulants: The patient has                            taken no previous anticoagulant or antiplatelet                            agents. ASA Grade Assessment: II - A patient with                            mild systemic disease. After reviewing the risks                            and benefits, the patient was deemed in  satisfactory condition to undergo the procedure.                           After obtaining informed consent, the colonoscope                            was passed under direct vision. Throughout the                            procedure, the patient's blood pressure, pulse, and                            oxygen saturations were monitored continuously. The   Colonoscope was introduced through the anus and                            advanced to the the cecum, identified by                            appendiceal orifice and ileocecal valve. The                            ileocecal valve, appendiceal orifice, and rectum                            were photographed. The quality of the bowel                            preparation was excellent. The colonoscopy was                            performed without difficulty. The patient tolerated                            the procedure well. The bowel preparation used was                            SUPREP. Scope In: 1:19:17 PM Scope Out: 1:37:04 PM Scope Withdrawal Time: 0 hours 14 minutes 16 seconds  Total Procedure Duration: 0 hours 17 minutes 47 seconds  Findings:                 Three polyps were found in the recto-sigmoid colon,                            descending colon and ascending colon. The polyps                            were 3 to 6 mm in size. These polyps were removed                            with a cold snare. Resection and retrieval were                            complete.  The exam was otherwise without abnormality on                            direct and retroflexion views. Complications:            No immediate complications. Estimated blood loss:                            None. Estimated Blood Loss:     Estimated blood loss: none. Impression:               - Three 3 to 6 mm polyps at the recto-sigmoid                            colon, in the descending colon and in the ascending                            colon, removed with a cold snare. Resected and                            retrieved.                           - The examination was otherwise normal on direct                            and retroflexion views. Recommendation:           - Repeat colonoscopy in 5 years for surveillance.                           - Patient has a contact number  available for                            emergencies. The signs and symptoms of potential                            delayed complications were discussed with the                            patient. Return to normal activities tomorrow.                            Written discharge instructions were provided to the                            patient.                           - Resume previous diet.                           - Continue present medications.                           - Await pathology results. Docia Chuck. Henrene Pastor, MD 09/05/2018 1:42:26 PM This report has been  signed electronically.

## 2018-09-05 NOTE — Patient Instructions (Signed)
Information on polyps given to you today.  Await pathology results.  YOU HAD AN ENDOSCOPIC PROCEDURE TODAY AT THE [facility]:   Refer to the procedure report that was given to you for any specific questions about what was found during the examination.  If the procedure report does not answer your questions, please call your gastroenterologist to clarify.  If you requested that your care partner not be given the details of your procedure findings, then the procedure report has been included in a sealed envelope for you to review at your convenience later.  YOU SHOULD EXPECT: Some feelings of bloating in the abdomen. Passage of more gas than usual.  Walking can help get rid of the air that was put into your GI tract during the procedure and reduce the bloating. If you had a lower endoscopy (such as a colonoscopy or flexible sigmoidoscopy) you may notice spotting of blood in your stool or on the toilet paper. If you underwent a bowel prep for your procedure, you may not have a normal bowel movement for a few days.  Please Note:  You might notice some irritation and congestion in your nose or some drainage.  This is from the oxygen used during your procedure.  There is no need for concern and it should clear up in a day or so.  SYMPTOMS TO REPORT IMMEDIATELY:   Following lower endoscopy (colonoscopy or flexible sigmoidoscopy):  Excessive amounts of blood in the stool  Significant tenderness or worsening of abdominal pains  Swelling of the abdomen that is new, acute  Fever of 100F or higher   For urgent or emergent issues, a gastroenterologist can be reached at any hour by calling [phone].   DIET:  We do recommend a small meal at first, but then you may proceed to your regular diet.  Drink plenty of fluids but you should avoid alcoholic beverages for 24 hours.  ACTIVITY:  You should plan to take it easy for the rest of today and you should NOT DRIVE or use heavy machinery  until tomorrow (because of the sedation medicines used during the test).    FOLLOW UP: Our staff will call the number listed on your records the next business day following your procedure to check on you and address any questions or concerns that you may have regarding the information given to you following your procedure. If we do not reach you, we will leave a message.  However, if you are feeling well and you are not experiencing any problems, there is no need to return our call.  We will assume that you have returned to your regular daily activities without incident.  If any biopsies were taken you will be contacted by phone or by letter within the next 1-3 weeks.  Please call us at [phone] if you have not heard about the biopsies in 3 weeks.    SIGNATURES/CONFIDENTIALITY: You and/or your care partner have signed paperwork which will be entered into your electronic medical record.  These signatures attest to the fact that that the information above on your After Visit Summary has been reviewed and is understood.  Full responsibility of the confidentiality of this discharge information lies with you and/or your care-partner. 

## 2018-09-08 ENCOUNTER — Telehealth: Payer: Self-pay

## 2018-09-08 NOTE — Telephone Encounter (Signed)
  Follow up Call-  Call back number 09/05/2018  Post procedure Call Back phone  # [phone]  Permission to leave phone message Yes  Some recent data might be hidden     Patient questions:  Do you have a fever, pain , or abdominal swelling? No. Pain Score  0 *  Have you tolerated food without any problems? Yes.    Have you been able to return to your normal activities? Yes.    Do you have any questions about your discharge instructions: Diet   No. Medications  No. Follow up visit  No.  Do you have questions or concerns about your Care? No.  Actions: * If pain score is 4 or above: No action needed, pain <4.  Pt was not available to speak to me.  I spoke with pt's wife and she said he did great.  No problems noted per pt. maw

## 2018-09-11 ENCOUNTER — Encounter: Payer: Self-pay | Admitting: Internal Medicine

## 2018-10-06 ENCOUNTER — Other Ambulatory Visit: Payer: Self-pay | Admitting: Family

## 2018-11-21 ENCOUNTER — Telehealth: Payer: Self-pay | Admitting: *Deleted

## 2018-11-21 NOTE — Telephone Encounter (Signed)
Lmovm to contact office to schedule Overdue 6 month f/u appointment as virtual/Telehealth visit.

## 2019-03-09 ENCOUNTER — Other Ambulatory Visit: Payer: Self-pay | Admitting: Family

## 2019-03-16 ENCOUNTER — Other Ambulatory Visit: Payer: Self-pay | Admitting: Family

## 2019-04-02 ENCOUNTER — Other Ambulatory Visit: Payer: Self-pay | Admitting: Family

## 2019-04-02 ENCOUNTER — Telehealth: Payer: Self-pay | Admitting: Family

## 2019-04-02 NOTE — Telephone Encounter (Signed)
Patient requests a refill of Amlodipine (4 tablets) sent to [facility] to hold him over until his appointment scheduled for Tuesday morning

## 2019-04-06 NOTE — Telephone Encounter (Signed)
Med sent in on 6 for 14 tablets.

## 2019-04-07 ENCOUNTER — Encounter: Payer: Self-pay | Admitting: Family

## 2019-04-07 ENCOUNTER — Other Ambulatory Visit: Payer: Self-pay

## 2019-04-07 ENCOUNTER — Ambulatory Visit (INDEPENDENT_AMBULATORY_CARE_PROVIDER_SITE_OTHER): Payer: 59 | Admitting: Family

## 2019-04-07 VITALS — BP 139/93 | HR 77 | Temp 97.5°F | Resp 16 | Ht 69.0 in | Wt 229.0 lb

## 2019-04-07 DIAGNOSIS — L989 Disorder of the skin and subcutaneous tissue, unspecified: Secondary | ICD-10-CM | POA: Diagnosis not present

## 2019-04-07 DIAGNOSIS — K5909 Other constipation: Secondary | ICD-10-CM | POA: Diagnosis not present

## 2019-04-07 DIAGNOSIS — I10 Essential (primary) hypertension: Secondary | ICD-10-CM | POA: Diagnosis not present

## 2019-04-07 DIAGNOSIS — E785 Hyperlipidemia, unspecified: Secondary | ICD-10-CM

## 2019-04-07 LAB — COMPREHENSIVE METABOLIC PANEL
ALT: 28 U/L (ref 0–53)
AST: 26 U/L (ref 0–37)
Albumin: 4.5 g/dL (ref 3.5–5.2)
Alkaline Phosphatase: 65 U/L (ref 39–117)
BUN: 19 mg/dL (ref 6–23)
CO2: 30 mEq/L (ref 19–32)
Calcium: 9.6 mg/dL (ref 8.4–10.5)
Chloride: 99 mEq/L (ref 96–112)
Creatinine, Ser: 1.19 mg/dL (ref 0.40–1.50)
GFR: 62.11 mL/min (ref >60.00)
Glucose, Bld: 103 mg/dL — ABNORMAL HIGH (ref 70–99)
Potassium: 3.9 mEq/L (ref 3.5–5.1)
Sodium: 135 mEq/L (ref 135–145)
Total Bilirubin: 0.7 mg/dL (ref 0.2–1.2)
Total Protein: 7.3 g/dL (ref 6.0–8.3)

## 2019-04-07 LAB — LIPID PANEL
Cholesterol: 138 mg/dL (ref 0–200)
HDL: 45.5 mg/dL (ref >39.00)
LDL Cholesterol: 70 mg/dL (ref 0–99)
NonHDL: 92.4
Total CHOL/HDL Ratio: 3
Triglycerides: 111 mg/dL (ref 0.0–149.0)
VLDL: 22.2 mg/dL (ref 0.0–40.0)

## 2019-04-07 MED ORDER — AMLODIPINE BESYLATE 5 MG PO TABS: 5.0000 mg | ORAL_TABLET | Freq: Every day | ORAL | 1 refills | Status: DC

## 2019-04-07 MED ORDER — ROSUVASTATIN CALCIUM 20 MG PO TABS: 20.0000 mg | ORAL_TABLET | Freq: Every day | ORAL | 1 refills | Status: DC

## 2019-04-07 NOTE — Progress Notes (Signed)
Subjective:    Patient ID: Ryan [name]., male    DOB: 07-10-1956, 63 y.o.   MRN: [id number]  HPI  Patient is a 63 yr old male who presents today for follow up.   HTN- pt reports that he took meds last night but had been out for a while. Typically 120's/80's at home. He denies leg swelling, chest pain. BP Readings from Last 3 Encounters:  04/07/19 (!) 139/93  09/05/18 (!) 113/59  05/26/18 138/88   Hyperlipidemia- continues crestor.  Lab Results  Component Value Date   CHOL 135 02/07/2018   HDL 46 02/07/2018   LDLCALC 69 02/07/2018   LDLDIRECT 144.3 11/17/2007   TRIG 117 02/07/2018   CHOLHDL 2.9 02/07/2018   Chronic constipation- reports that he takes metamucil.  Has raisin bran for breakfast, dinner has a vegetable.   Smokes 2 cigars a week.   Review of Systems See HPI  Past Medical History:  Diagnosis Date  . Abnormal liver function    history of   . Atypical chest pain   . Blind left eye    blind left eye since birth  . Cancer ([facility])    skin:  basal cell carcinoma/ on face,back and neck  . Cataract    Bil  . Diastolic dysfunction    J.0/3009 Echo: EF 55-60%, Gr1 DD, triv MR.  . Fatty liver    fatty liver disease  . GERD (gastroesophageal reflux disease)   . History of indigestion   . Hyperlipidemia   . Hypertension   . Non-obstructive CAD (coronary artery disease)    a. 2016 Neg MV; b. 10/2017 Cath: LM nl, LAD 9m, D1/2/3 nl, LCX 20p, RCA nl, EF 50-55%.  . SOB (shortness of breath) 2019   after climbing stairs     Social History   Socioeconomic History  . Marital status: Married    Spouse name: Not on file  . Number of children: 1  . Years of education: Not on file  . Highest education level: Not on file  Occupational History  . Occupation: Designer, industrial/product: [employer]  . Financial resource strain: Not on file  . Food insecurity    Worry: Not on file    Inability: Not on file  . Transportation needs   Medical: Not on file    Non-medical: Not on file  Tobacco Use  . Smoking status: Current Some Day Smoker    Types: Cigars  . Smokeless tobacco: Never Used  . Tobacco comment: occasional cigar  Substance and Sexual Activity  . Alcohol use: Yes    Alcohol/week: 2.0 standard drinks    Types: 1 Cans of beer, 1 Standard drinks or equivalent per week    Comment: occasional drink  . Drug use: No  . Sexual activity: Not on file  Lifestyle  . Physical activity    Days per week: Not on file    Minutes per session: Not on file  . Stress: Not on file  Relationships  . Social Herbalist on phone: Not on file    Gets together: Not on file    Attends religious service: Not on file    Active member of club or organization: Not on file    Attends meetings of clubs or organizations: Not on file    Relationship status: Not on file  . Intimate partner violence    Fear of current or ex partner: Not on  file    Emotionally abused: Not on file    Physically abused: Not on file    Forced sexual activity: Not on file  Other Topics Concern  . Not on file  Social History Narrative       Past Surgical History:  Procedure Laterality Date  . COLONOSCOPY    . LEFT HEART CATH AND CORONARY ANGIOGRAPHY Left 10/29/2017   Procedure: LEFT HEART CATH AND CORONARY ANGIOGRAPHY;  Surgeon: Nelva Bush, MD;  Location: [facility];  Service: Cardiovascular;  Laterality: Left;  . TONSILLECTOMY  1965   as a child    Family History  Problem Relation Age of Onset  . Colon cancer Mother        deceased age 86  . Diabetes Mother   . Hypertension Mother   . Lung cancer Father        deceased age 14  . Heart disease Father        Bypass in mid-late 62's  . Heart attack Father        65  . Diabetes Sister   . Colon polyps Neg Hx   . Esophageal cancer Neg Hx   . Stomach cancer Neg Hx   . Rectal cancer Neg Hx     Allergies  Allergen Reactions  . Atorvastatin Other (See Comments)     Hands swelled  . Tape     Caused redness      BP (!) 139/93 (BP Location: Right Arm, Patient Position: Sitting, Cuff Size: Large)   Pulse 77   Temp (!) 97.5 F (36.4 C) (Oral)   Resp 16   Ht 5\' 9"  (1.753 m)   Wt 229 lb (103.9 kg)   SpO2 97%   BMI 33.82 kg/m       Objective:   Physical Exam Constitutional:      General: He is not in acute distress.    Appearance: He is well-developed.  HENT:     Head: Normocephalic and atraumatic.  Cardiovascular:     Rate and Rhythm: Normal rate and regular rhythm.     Heart sounds: No murmur.  Pulmonary:     Effort: Pulmonary effort is normal. No respiratory distress.     Breath sounds: Normal breath sounds. No wheezing or rales.  Skin:    General: Skin is warm and dry.     Comments: Small raised lesion left temple  Neurological:     Mental Status: He is alert and oriented to person, place, and time.  Psychiatric:        Behavior: Behavior normal.        Thought Content: Thought content normal.           Assessment & Plan:  Skin lesion- requests referral to dermatology for excision. Referral placed.  HTN- bp mildly elevated today.  I have advised the patient to continue to monitor his blood pressure at home.  He is to call if blood pressure remains elevated.  Chronic constipation- we discussed importance of increasing his intake of fresh produce and water.  Continue Metamucil.  Hyperlipidemia-tolerating statin, obtain follow-up lipid panel.  Tobacco abuse-she has cut back, however I have advised him importance of complete cessation for his health.

## 2019-06-19 ENCOUNTER — Other Ambulatory Visit: Payer: Self-pay

## 2019-06-19 ENCOUNTER — Encounter: Payer: Self-pay | Admitting: Internal Medicine

## 2019-06-19 ENCOUNTER — Ambulatory Visit (INDEPENDENT_AMBULATORY_CARE_PROVIDER_SITE_OTHER): Payer: 59 | Admitting: Cardiovascular Disease

## 2019-06-19 VITALS — BP 132/82 | HR 71 | Ht 69.0 in | Wt 231.0 lb

## 2019-06-19 DIAGNOSIS — E785 Hyperlipidemia, unspecified: Secondary | ICD-10-CM | POA: Diagnosis not present

## 2019-06-19 DIAGNOSIS — I10 Essential (primary) hypertension: Secondary | ICD-10-CM | POA: Diagnosis not present

## 2019-06-19 DIAGNOSIS — I25118 Atherosclerotic heart disease of native coronary artery with other forms of angina pectoris: Secondary | ICD-10-CM | POA: Diagnosis not present

## 2019-06-19 DIAGNOSIS — Z23 Encounter for immunization: Secondary | ICD-10-CM

## 2019-06-19 NOTE — Progress Notes (Signed)
Cardiology Office Note  Date:  06/19/2019   ID:  Ryan [name]., DOB Oct 02, 1955, MRN [id number]  PCP:  Ryan Alar, NP   Chief Complaint  Patient presents with  . OTHER    OD 6 month f/u no complaints today. Meds reviewed verbally with pt.    HPI:   [name] Is a 63 y.o. year-old male with history of  nonobstructive coronary artery disease, hypertension,  hyperlipidemia,  GERD,  hepatic steatosis who presents for follow-up of CAD and HTN.   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, [name] truck  Denies any chest pain or shortness of breath on exertion Report his weight has been trending upwards  Wonders if he needs to stay on his Crestor LDL 193 in 10/2017 Most recent LDL of 70 August 2020 Numbers discussed with him in detail  Having problems with Insomnia, sleeps 2 hours then wakes up Was supposed to do sleep study Does not want to really go that route Trying to lose weight first Sleepy in the evening, reports that he feels fine in the day  Trace edema, above the sock line, does not think it is a problem, chronic issue  EKG personally reviewed by myself on todays visit Shows normal sinus rhythm with rate 71 bpm no significant ST or T wave changes  Lab Results  Component Value Date   CHOL 138 04/07/2019   HDL 45.50 04/07/2019   [place] 70 04/07/2019   TRIG 111.0 04/07/2019    PMH:   has a past medical history of Abnormal liver function, Atypical chest pain, Blind left eye, Cancer ([place]), Cataract, Diastolic dysfunction, Fatty liver, GERD (gastroesophageal reflux disease), History of indigestion, Hyperlipidemia, Hypertension, Non-obstructive CAD (coronary artery disease), and SOB (shortness of breath) (2019).  PSH:    Past Surgical History:  Procedure Laterality Date  . COLONOSCOPY    . LEFT HEART CATH AND CORONARY ANGIOGRAPHY Left 10/29/2017   Procedure: LEFT HEART  CATH AND CORONARY ANGIOGRAPHY;  Surgeon: Nelva Bush, MD;  Location: [facility];  Service: Cardiovascular;  Laterality: Left;  . TONSILLECTOMY  1965   as a child    Current Outpatient Medications  Medication Sig Dispense Refill  . amLODipine (NORVASC) 5 MG tablet Take 1 tablet (5 mg total) by mouth daily. 90 tablet 1  . aspirin EC 81 MG tablet Take 1 tablet (81 mg total) by mouth daily. 90 tablet 3  . Omega-3 Fatty Acids (FISH OIL) 1200 MG CAPS Take 4,800 mg by mouth daily.     . rosuvastatin (CRESTOR) 20 MG tablet Take 1 tablet (20 mg total) by mouth daily. 90 tablet 1  . Multiple Vitamins-Minerals (OCUVITE PO) Take 1 capsule by mouth daily.    . psyllium (METAMUCIL) 58.6 % powder Take 1 packet by mouth as needed.     No current facility-administered medications for this visit.      Allergies:   Atorvastatin and Tape   Social History:  The patient  reports that he has been smoking cigars. He has never used smokeless tobacco. He reports current alcohol use of about 2.0 standard drinks of alcohol per week. He reports that he does not use drugs.   Family History:   family history includes Colon cancer in his mother; Diabetes in his mother and sister; Heart attack in his father; Heart disease in his father; Hypertension in his mother; Lung cancer in his  father.    Review of Systems: Review of Systems  Constitutional: Negative.   HENT: Negative.   Respiratory: Negative.   Cardiovascular: Positive for leg swelling.  Gastrointestinal: Negative.   Musculoskeletal: Negative.   Neurological: Negative.   Psychiatric/Behavioral: Negative.   All other systems reviewed and are negative.   PHYSICAL EXAM: VS:  BP 132/82 (BP Location: Left Arm, Patient Position: Sitting, Cuff Size: Normal)   Pulse 71   Ht 5\' 9"  (1.753 m)   Wt 231 lb (104.8 kg)   SpO2 98%   BMI 34.11 kg/m  , BMI Body mass index is 34.11 kg/m. GEN: Well nourished, well developed, in no acute  distress HEENT: normal Neck: no JVD, carotid bruits, or masses Cardiac: RRR; no murmurs, rubs, or gallops,no edema  Respiratory:  clear to auscultation bilaterally, normal work of breathing GI: soft, nontender, nondistended, + BS MS: no deformity or atrophy Skin: warm and dry, no rash Neuro:  Strength and sensation are intact Psych: euthymic mood, full affect   Recent Labs: 04/07/2019: ALT 28; BUN 19; Creatinine, Ser 1.19; Potassium 3.9; Sodium 135    Lipid Panel Lab Results  Component Value Date   CHOL 138 04/07/2019   HDL 45.50 04/07/2019   LDLCALC 70 04/07/2019   TRIG 111.0 04/07/2019      Wt Readings from Last 3 Encounters:  06/19/19 231 lb (104.8 kg)  04/07/19 229 lb (103.9 kg)  09/05/18 230 lb (104.3 kg)       ASSESSMENT AND PLAN:  Problem List Items Addressed This Visit      Cardiology Problems   Hyperlipidemia   Essential hypertension   Relevant Orders   EKG 12-Lead    Other Visit Diagnoses    Coronary artery disease of native artery of native heart with stable angina pectoris ([place])    -  Primary   Relevant Orders   EKG 12-Lead   Need for immunization against influenza       Relevant Orders   Flu Vaccine QUAD 36+ mos IM (Completed)     Denies any significant symptoms concerning for angina Prior cardiac catheterization report pulled up and reviewed with him, 50% mid to distal LAD disease noted Stressed to him the importance of staying on his Crestor, goal LDL 70 or less Dramatic improvement in cholesterol numbers on statin Recommended lifestyle modification especially if he is retires, weight loss, walking program  Hyperlipidemia Cholesterol is at goal on the current lipid regimen. No changes to the medications were made.  Essential hypertension Blood pressure is well controlled on today's visit. No changes made to the medications.  Possible sleep apnea/insomnia Again discussed need for sleep study, he has declined at this time He prefers to  attempt weight loss to improve his symptoms Discussed the downside of poorly controlled sleep apnea Suggested he at least consider a dental guard for sleep apnea, recommend he talk with his dentist.  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 Saunders Revel   Total encounter time more than 25 minutes  Greater than 50% was spent in counseling and coordination of care with the patient    Signed, Esmond Plants, M.D., Ph.D. [place], [place]

## 2019-06-19 NOTE — Patient Instructions (Signed)
Medication Instructions:  No changes  If you need a refill on your cardiac medications before your next appointment, please call your pharmacy.    Lab work: No new labs needed   If you have labs (blood work) drawn today and your tests are completely normal, you will receive your results only by: Marland Kitchen MyChart Message (if you have MyChart) OR . A paper copy in the mail If you have any lab test that is abnormal or we need to change your treatment, we will call you to review the results.   Testing/Procedures: No new testing needed   Follow-Up: At [facility], you and your health needs are our priority.  As part of our continuing mission to provide you with exceptional heart care, we have created designated Provider Care Teams.  These Care Teams include your primary Cardiologist (physician) and Advanced Practice Providers (APPs -  Physician Assistants and Nurse Practitioners) who all work together to provide you with the care you need, when you need it.  . You will need a follow up appointment in 12 months with Dr. Saunders Ryan [name]  . Providers on your designated Care Team:   . Murray Hodgkins, NP . Christell Faith, PA-C . Marrianne Mood, PA-C  Any Other Special Instructions Will Be Listed Below (If Applicable).  For educational health videos Log in to : [email or web address] Or : [email or web address], password : [email or web address]

## 2019-08-26 ENCOUNTER — Encounter: Payer: Self-pay | Admitting: Medical

## 2019-08-26 ENCOUNTER — Ambulatory Visit (INDEPENDENT_AMBULATORY_CARE_PROVIDER_SITE_OTHER): Payer: 59 | Admitting: Medical

## 2019-08-26 ENCOUNTER — Other Ambulatory Visit: Payer: Self-pay

## 2019-08-26 VITALS — BP 135/70 | HR 80 | Temp 97.5°F | Ht 69.0 in | Wt 231.6 lb

## 2019-08-26 DIAGNOSIS — Z0000 Encounter for general adult medical examination without abnormal findings: Secondary | ICD-10-CM

## 2019-08-26 DIAGNOSIS — R0683 Snoring: Secondary | ICD-10-CM

## 2019-08-26 DIAGNOSIS — Z125 Encounter for screening for malignant neoplasm of prostate: Secondary | ICD-10-CM

## 2019-08-26 LAB — COMPREHENSIVE METABOLIC PANEL
ALT: 28 U/L (ref 0–53)
AST: 25 U/L (ref 0–37)
Albumin: 4.5 g/dL (ref 3.5–5.2)
Alkaline Phosphatase: 79 U/L (ref 39–117)
BUN: 18 mg/dL (ref 6–23)
CO2: 30 mEq/L (ref 19–32)
Calcium: 9.9 mg/dL (ref 8.4–10.5)
Chloride: 101 mEq/L (ref 96–112)
Creatinine, Ser: 1.16 mg/dL (ref 0.40–1.50)
GFR: 63.89 mL/min (ref >60.00)
Glucose, Bld: 105 mg/dL — ABNORMAL HIGH (ref 70–99)
Potassium: 4.8 mEq/L (ref 3.5–5.1)
Sodium: 138 mEq/L (ref 135–145)
Total Bilirubin: 0.6 mg/dL (ref 0.2–1.2)
Total Protein: 7.3 g/dL (ref 6.0–8.3)

## 2019-08-26 LAB — CBC WITH DIFFERENTIAL/PLATELET
Basophils Absolute: 0 10*3/uL (ref 0.0–0.1)
Basophils Relative: 0.4 % (ref 0.0–3.0)
Eosinophils Absolute: 0.3 10*3/uL (ref 0.0–0.7)
Eosinophils Relative: 2.5 % (ref 0.0–5.0)
HCT: 44.1 % (ref 39.0–52.0)
Hemoglobin: 15.2 g/dL (ref 13.0–17.0)
Lymphocytes Relative: 11.9 % — ABNORMAL LOW (ref 12.0–46.0)
Lymphs Abs: 1.3 10*3/uL (ref 0.7–4.0)
MCHC: 34.4 g/dL (ref 30.0–36.0)
MCV: 87.7 fl (ref 78.0–100.0)
Monocytes Absolute: 0.6 10*3/uL (ref 0.1–1.0)
Monocytes Relative: 5.5 % (ref 3.0–12.0)
Neutro Abs: 8.7 10*3/uL — ABNORMAL HIGH (ref 1.4–7.7)
Neutrophils Relative %: 79.7 % — ABNORMAL HIGH (ref 43.0–77.0)
Platelets: 226 10*3/uL (ref 150.0–400.0)
RBC: 5.03 Mil/uL (ref 4.22–5.81)
RDW: 13 % (ref 11.5–15.5)
WBC: 10.9 10*3/uL — ABNORMAL HIGH (ref 4.0–10.5)

## 2019-08-26 LAB — LIPID PANEL
Cholesterol: 143 mg/dL (ref 0–200)
HDL: 58.1 mg/dL (ref >39.00)
LDL Cholesterol: 72 mg/dL (ref 0–99)
NonHDL: 85.24
Total CHOL/HDL Ratio: 2
Triglycerides: 64 mg/dL (ref 0.0–149.0)
VLDL: 12.8 mg/dL (ref 0.0–40.0)

## 2019-08-26 LAB — PSA: PSA: 2.12 ng/mL (ref 0.10–4.00)

## 2019-08-26 NOTE — Progress Notes (Signed)
Subjective:    Patient ID: Ryan [name]., male    DOB: Jan 05, 1956, 63 y.o.   MRN: [id number]  HPI  Pt in for some trouble sleeping at night and dozing off during the day. States dozes off briefly at time during the day.   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 Until we get current condition evaluated advise just to supervise.   Pt wife states he does snoring for years. He states usually does not feel rested in the morning when he wakes.   One night he woke up and felt like he was choking.  Also mentions some reflux at times at night if eats certain foods.(example tomatoes, onions and chocolate.)  Pt in the past was recommended to have sleep study.   Pt got flu vaccine already this year.  Pt is fasting and agrees to cpe/wellness.  Pt does smoke 2-3 cigars off and on for about 10 years.  Pt up to date on his colonoscopy.  Pt follows up with derm annually. Last check was done about 4 months  Ago.  hiv screen declined.    Review of Systems  Constitutional: Negative for chills and fatigue.  HENT: Negative for congestion, drooling, ear pain and postnasal drip.   Respiratory: Negative for cough, chest tightness, shortness of breath and wheezing.        Snoring.  Cardiovascular: Negative for chest pain and palpitations.  Gastrointestinal: Negative for abdominal pain, constipation, nausea and vomiting.  Genitourinary: Negative for decreased urine volume, difficulty urinating, flank pain, scrotal swelling and testicular pain.       Years of frequent urination since 63 years old.  Musculoskeletal: Negative for back pain.  Skin: Negative for rash.  Neurological: Negative for dizziness, syncope, weakness and headaches.  Hematological: Negative for adenopathy. Does not bruise/bleed easily.  Psychiatric/Behavioral: Positive for sleep disturbance. Negative for behavioral problems and confusion. The patient is not nervous/anxious.     Past Medical  History:  Diagnosis Date  . Abnormal liver function    history of   . Atypical chest pain   . Blind left eye    blind left eye since birth  . Cancer ([place])    skin:  basal cell carcinoma/ on face,back and neck  . Cataract    Bil  . Diastolic dysfunction    [id number] Echo: EF 55-60%, Gr1 DD, triv MR.  . Fatty liver    fatty liver disease  . GERD (gastroesophageal reflux disease)   . History of indigestion   . Hyperlipidemia   . Hypertension   . Non-obstructive CAD (coronary artery disease)    a. 2016 Neg MV; b. 10/2017 Cath: LM nl, LAD 51m, D1/2/3 nl, LCX 20p, RCA nl, EF 50-55%.  . SOB (shortness of breath) 2019   after climbing stairs     Social History   Socioeconomic History  . Marital status: Married    Spouse name: Not on file  . Number of children: 1  . Years of education: Not on file  . Highest education level: Not on file  Occupational History  . Occupation: Designer, industrial/product: [employer]  Tobacco Use  . Smoking status: Current Some Day Smoker    Types: Cigars  . Smokeless tobacco: Never Used  . Tobacco comment: occasional cigar  Substance and Sexual Activity  . Alcohol use: Yes    Alcohol/week: 2.0 standard drinks    Types: 1 Cans of beer, 1 Standard drinks  or equivalent per week    Comment: occasional drink  . Drug use: No  . Sexual activity: Not on file  Other Topics Concern  . Not on file  Social History Narrative      Social Determinants of Health   Financial Resource Strain:   . Difficulty of Paying Living Expenses: Not on file  Food Insecurity:   . Worried About Charity fundraiser in the Last Year: Not on file  . Ran Out of Food in the Last Year: Not on file  Transportation Needs:   . Lack of Transportation (Medical): Not on file  . Lack of Transportation (Non-Medical): Not on file  Physical Activity:   . Days of Exercise per Week: Not on file  . Minutes of Exercise per Session: Not on file  Stress:   . Feeling of Stress  : Not on file  Social Connections:   . Frequency of Communication with Friends and Family: Not on file  . Frequency of Social Gatherings with Friends and Family: Not on file  . Attends Religious Services: Not on file  . Active Member of Clubs or Organizations: Not on file  . Attends Archivist Meetings: Not on file  . Marital Status: Not on file  Intimate Partner Violence:   . Fear of Current or Ex-Partner: Not on file  . Emotionally Abused: Not on file  . Physically Abused: Not on file  . Sexually Abused: Not on file    Past Surgical History:  Procedure Laterality Date  . COLONOSCOPY    . LEFT HEART CATH AND CORONARY ANGIOGRAPHY Left 10/29/2017   Procedure: LEFT HEART CATH AND CORONARY ANGIOGRAPHY;  Surgeon: Nelva Bush, MD;  Location: [facility];  Service: Cardiovascular;  Laterality: Left;  . TONSILLECTOMY  1965   as a child    Family History  Problem Relation Age of Onset  . Colon cancer Mother        deceased age 20  . Diabetes Mother   . Hypertension Mother   . Lung cancer Father        deceased age 62  . Heart disease Father        Bypass in mid-late 32's  . Heart attack Father        69  . Diabetes Sister   . Colon polyps Neg Hx   . Esophageal cancer Neg Hx   . Stomach cancer Neg Hx   . Rectal cancer Neg Hx     Allergies  Allergen Reactions  . Atorvastatin Other (See Comments)    Hands swelled  . Tape     Caused redness    Current Outpatient Medications on File Prior to Visit  Medication Sig Dispense Refill  . aspirin EC 81 MG tablet Take 1 tablet (81 mg total) by mouth daily. 90 tablet 3  . Multiple Vitamins-Minerals (OCUVITE PO) Take 1 capsule by mouth daily.    . Omega-3 Fatty Acids (FISH OIL) 1200 MG CAPS Take 4,800 mg by mouth daily.     . psyllium (METAMUCIL) 58.6 % powder Take 1 packet by mouth as needed.    Marland Kitchen amLODipine (NORVASC) 5 MG tablet Take 1 tablet (5 mg total) by mouth daily. 90 tablet 1  . rosuvastatin (CRESTOR)  20 MG tablet Take 1 tablet (20 mg total) by mouth daily. 90 tablet 1   No current facility-administered medications on file prior to visit.    BP 108/80 (BP Location: Left Arm, Patient Position: Sitting, Cuff  Size: Normal)   Pulse 80   Temp (!) 97.5 F (36.4 C) (Temporal)   Ht 5\' 9"  (1.753 m)   Wt 231 lb 9.6 oz (105.1 kg)   SpO2 98%   BMI 34.20 kg/m       Objective:   Physical Exam  General Mental Status- Alert. General Appearance- Not in acute distress.   Skin General: Color- Normal Color. Moisture- Normal Moisture. Various scattered moles. None worrisome. Recent derm visit. Continue annual exams advised.  Neck Carotid Arteries- Normal color. Moisture- Normal Moisture. No carotid bruits. No JVD.  Chest and Lung Exam Auscultation: Breath Sounds:-Normal.  Cardiovascular Auscultation:Rythm- Regular. Murmurs & Other Heart Sounds:Auscultation of the heart reveals- No Murmurs.  Abdomen Inspection:-Inspeection Normal. Palpation/Percussion:Note:No mass. Palpation and Percussion of the abdomen reveal- Non Tender, Non Distended + BS, no rebound or guarding.   Neurologic Cranial Nerve exam:- CN III-XII intact(No nystagmus), symmetric smile. Strength:- 5/5 equal and symmetric strength both upper and lower extremities.      Assessment & Plan:  For you wellness exam today I have ordered cbc, cmp, lipid panel and psa  Vaccine up to date.  Recommend exercise and healthy diet.  We will let you know lab results as they come in.  Regarding snoring will refer to pulmonologist. Recommend at work just supervise and not drive until work up complete.  Follow up date appointment will be determined after lab review.   Continue to see derm regular annual basis.  Mackie Pai, [place]   [id number] charge in additon to wellness exam. Came in for snoring but on review discussion no recent wellness and fasting so went ahead and did wellness

## 2019-08-26 NOTE — Patient Instructions (Addendum)
For you wellness exam today I have ordered cbc, cmp, lipid panel and psa  Vaccine up to date.  Recommend exercise and healthy diet.  We will let you know lab results as they come in.  Regarding snoring will refer to pulmonologist. Recommend at work just supervise and not drive until work up complete.  Follow up date appointment will be determined after lab review.   Continue to see derm regular annual basis.   Preventive Care 39-61 Years Old, Male Preventive care refers to lifestyle choices and visits with your health care provider that can promote health and wellness. This includes:  A yearly physical exam. This is also called an annual well check.  Regular dental and eye exams.  Immunizations.  Screening for certain conditions.  Healthy lifestyle choices, such as eating a healthy diet, getting regular exercise, not using drugs or products that contain nicotine and tobacco, and limiting alcohol use. What can I expect for my preventive care visit? Physical exam Your health care provider will check:  Height and weight. These may be used to calculate body mass index (BMI), which is a measurement that tells if you are at a healthy weight.  Heart rate and blood pressure.  Your skin for abnormal spots. Counseling Your health care provider may ask you questions about:  Alcohol, tobacco, and drug use.  Emotional well-being.  Home and relationship well-being.  Sexual activity.  Eating habits.  Work and work Statistician. What immunizations do I need?  Influenza (flu) vaccine  This is recommended every year. Tetanus, diphtheria, and pertussis (Tdap) vaccine  You may need a Td booster every 10 years. Varicella (chickenpox) vaccine  You may need this vaccine if you have not already been vaccinated. Zoster (shingles) vaccine  You may need this after age 52. Measles, mumps, and rubella (MMR) vaccine  You may need at least one dose of MMR if you were born in 1957 or  later. You may also need a second dose. Pneumococcal conjugate (PCV13) vaccine  You may need this if you have certain conditions and were not previously vaccinated. Pneumococcal polysaccharide (PPSV23) vaccine  You may need one or two doses if you smoke cigarettes or if you have certain conditions. Meningococcal conjugate (MenACWY) vaccine  You may need this if you have certain conditions. Hepatitis A vaccine  You may need this if you have certain conditions or if you travel or work in places where you may be exposed to hepatitis A. Hepatitis B vaccine  You may need this if you have certain conditions or if you travel or work in places where you may be exposed to hepatitis B. Haemophilus influenzae type b (Hib) vaccine  You may need this if you have certain risk factors. Human papillomavirus (HPV) vaccine  If recommended by your health care provider, you may need three doses over 6 months. You may receive vaccines as individual doses or as more than one vaccine together in one shot (combination vaccines). Talk with your health care provider about the risks and benefits of combination vaccines. What tests do I need? Blood tests  Lipid and cholesterol levels. These may be checked every 5 years, or more frequently if you are over 102 years old.  Hepatitis C test.  Hepatitis B test. Screening  Lung cancer screening. You may have this screening every year starting at age 39 if you have a 30-pack-year history of smoking and currently smoke or have quit within the past 15 years.  Prostate cancer screening. Recommendations will  vary depending on your family history and other risks.  Colorectal cancer screening. All adults should have this screening starting at age 60 and continuing until age 15. Your health care provider may recommend screening at age 65 if you are at increased risk. You will have tests every 1-10 years, depending on your results and the type of screening  test.  Diabetes screening. This is done by checking your blood sugar (glucose) after you have not eaten for a while (fasting). You may have this done every 1-3 years.  Sexually transmitted disease (STD) testing. Follow these instructions at home: Eating and drinking  Eat a diet that includes fresh fruits and vegetables, whole grains, lean protein, and low-fat dairy products.  Take vitamin and mineral supplements as recommended by your health care provider.  Do not drink alcohol if your health care provider tells you not to drink.  If you drink alcohol: ? Limit how much you have to 0-2 drinks a day. ? Be aware of how much alcohol is in your drink. In the [place] one drink equals one 12 oz bottle of beer (355 mL), one 5 oz glass of wine (148 mL), or one 1 oz glass of hard liquor (44 mL). Lifestyle  Take daily care of your teeth and gums.  Stay active. Exercise for at least 30 minutes on 5 or more days each week.  Do not use any products that contain nicotine or tobacco, such as cigarettes, e-cigarettes, and chewing tobacco. If you need help quitting, ask your health care provider.  If you are sexually active, practice safe sex. Use a condom or other form of protection to prevent STIs (sexually transmitted infections).  Talk with your health care provider about taking a low-dose aspirin every day starting at age 73. What's next?  Go to your health care provider once a year for a well check visit.  Ask your health care provider how often you should have your eyes and teeth checked.  Stay up to date on all vaccines. This information is not intended to replace advice given to you by your health care provider. Make sure you discuss any questions you have with your health care provider. Document Released: 09/09/2015 Document Revised: 08/07/2018 Document Reviewed: 08/07/2018 Elsevier Patient Education  2020 [employer].

## 2019-08-27 ENCOUNTER — Telehealth: Payer: Self-pay | Admitting: *Deleted

## 2019-08-27 NOTE — Telephone Encounter (Signed)
I sent his work note to my chart.

## 2019-08-27 NOTE — Telephone Encounter (Signed)
Copied from [place] [phone]. Topic: General - Other >> Aug 27, 2019 10:13 AM Leward Quan A wrote: Reason for CRM: Patient wife called to inquire if Dr Harvie Heck or Debbrah Alar can please put in writing that he should not drive and upload the note to My Chart so that they can have it for his employer. Any questions please call Ph# [phone]

## 2019-08-27 NOTE — Telephone Encounter (Signed)
Left message on voicemail that work note was sent and to let us know if any difficulty accessing it.

## 2019-08-28 ENCOUNTER — Telehealth: Payer: Self-pay | Admitting: Medical

## 2019-08-28 DIAGNOSIS — R972 Elevated prostate specific antigen [PSA]: Secondary | ICD-10-CM

## 2019-08-28 NOTE — Telephone Encounter (Signed)
Future psa placed. Please help pt get scheduled for lab in one month.

## 2019-09-01 ENCOUNTER — Ambulatory Visit (INDEPENDENT_AMBULATORY_CARE_PROVIDER_SITE_OTHER): Payer: 59 | Admitting: Family

## 2019-09-01 ENCOUNTER — Encounter: Payer: Self-pay | Admitting: Family

## 2019-09-01 ENCOUNTER — Other Ambulatory Visit: Payer: Self-pay

## 2019-09-01 VITALS — BP 132/74 | HR 76 | Temp 96.9°F | Resp 18 | Wt 232.2 lb

## 2019-09-01 DIAGNOSIS — E785 Hyperlipidemia, unspecified: Secondary | ICD-10-CM | POA: Diagnosis not present

## 2019-09-01 DIAGNOSIS — K5909 Other constipation: Secondary | ICD-10-CM | POA: Diagnosis not present

## 2019-09-01 DIAGNOSIS — I10 Essential (primary) hypertension: Secondary | ICD-10-CM | POA: Diagnosis not present

## 2019-09-01 MED ORDER — ROSUVASTATIN CALCIUM 20 MG PO TABS: 20.0000 mg | ORAL_TABLET | Freq: Every day | ORAL | 1 refills | Status: DC

## 2019-09-01 MED ORDER — AMLODIPINE BESYLATE 5 MG PO TABS: 5.0000 mg | ORAL_TABLET | Freq: Every day | ORAL | 1 refills | Status: DC

## 2019-09-01 NOTE — Progress Notes (Signed)
Subjective:    Patient ID: Ryan [name]., male    DOB: September 05, 1955, 64 y.o.   MRN: [id number]  HPI  Patient is a 64 yr old male who presents today  Hyperlipidemia- maintained on crestor.  Lab Results  Component Value Date   CHOL 143 08/26/2019   HDL 58.10 08/26/2019   LDLCALC 72 08/26/2019   LDLDIRECT 144.3 11/17/2007   TRIG 64.0 08/26/2019   CHOLHDL 2 08/26/2019   HTN- maintained on amlodipine 5mg .   BP Readings from Last 3 Encounters:  09/01/19 132/74  08/26/19 135/70  06/19/19 132/82   Chronic constipation- trying to eat more salad.  Has about 5-6 BM's a week.      Review of Systems See HPI  Past Medical History:  Diagnosis Date  . Abnormal liver function    history of   . Atypical chest pain   . Blind left eye    blind left eye since birth  . Cancer ([place])    skin:  basal cell carcinoma/ on face,back and neck  . Cataract    Bil  . Diastolic dysfunction    Q000111Q Echo: EF 55-60%, Gr1 DD, triv MR.  . Fatty liver    fatty liver disease  . GERD (gastroesophageal reflux disease)   . History of indigestion   . Hyperlipidemia   . Hypertension   . Non-obstructive CAD (coronary artery disease)    a. 2016 Neg MV; b. 10/2017 Cath: LM nl, LAD 32m, D1/2/3 nl, LCX 20p, RCA nl, EF 50-55%.  . SOB (shortness of breath) 2019   after climbing stairs     Social History   Socioeconomic History  . Marital status: Married    Spouse name: Not on file  . Number of children: 1  . Years of education: Not on file  . Highest education level: Not on file  Occupational History  . Occupation: Designer, industrial/product: [employer]  Tobacco Use  . Smoking status: Light Tobacco Smoker    Types: Cigars  . Smokeless tobacco: Never Used  . Tobacco comment: occasional cigar  Substance and Sexual Activity  . Alcohol use: Yes    Alcohol/week: 2.0 standard drinks    Types: 1 Cans of beer, 1 Standard drinks or equivalent per week    Comment: occasional drink  .  Drug use: No  . Sexual activity: Not on file  Other Topics Concern  . Not on file  Social History Narrative      Social Determinants of Health   Financial Resource Strain:   . Difficulty of Paying Living Expenses: Not on file  Food Insecurity:   . Worried About Charity fundraiser in the Last Year: Not on file  . Ran Out of Food in the Last Year: Not on file  Transportation Needs:   . Lack of Transportation (Medical): Not on file  . Lack of Transportation (Non-Medical): Not on file  Physical Activity:   . Days of Exercise per Week: Not on file  . Minutes of Exercise per Session: Not on file  Stress:   . Feeling of Stress : Not on file  Social Connections:   . Frequency of Communication with Friends and Family: Not on file  . Frequency of Social Gatherings with Friends and Family: Not on file  . Attends Religious Services: Not on file  . Active Member of Clubs or Organizations: Not on file  . Attends Archivist Meetings: Not on file  .  Marital Status: Not on file  Intimate Partner Violence:   . Fear of Current or Ex-Partner: Not on file  . Emotionally Abused: Not on file  . Physically Abused: Not on file  . Sexually Abused: Not on file    Past Surgical History:  Procedure Laterality Date  . COLONOSCOPY    . LEFT HEART CATH AND CORONARY ANGIOGRAPHY Left 10/29/2017   Procedure: LEFT HEART CATH AND CORONARY ANGIOGRAPHY;  Surgeon: Nelva Bush, MD;  Location: [facility];  Service: Cardiovascular;  Laterality: Left;  . TONSILLECTOMY  1965   as a child    Family History  Problem Relation Age of Onset  . Colon cancer Mother        deceased age 46  . Diabetes Mother   . Hypertension Mother   . Lung cancer Father        deceased age 44  . Heart disease Father        Bypass in mid-late 66's  . Heart attack Father        4  . Diabetes Sister   . Colon polyps Neg Hx   . Esophageal cancer Neg Hx   . Stomach cancer Neg Hx   . Rectal cancer Neg Hx       Allergies  Allergen Reactions  . Atorvastatin Other (See Comments)    Hands swelled  . Tape     Caused redness    Current Outpatient Medications on File Prior to Visit  Medication Sig Dispense Refill  . aspirin EC 81 MG tablet Take 1 tablet (81 mg total) by mouth daily. 90 tablet 3  . Omega-3 Fatty Acids (FISH OIL) 1200 MG CAPS Take 4,800 mg by mouth daily.     . psyllium (METAMUCIL) 58.6 % powder Take 1 packet by mouth as needed.     No current facility-administered medications on file prior to visit.    BP 132/74 (BP Location: Left Arm, Patient Position: Sitting, Cuff Size: Large)   Pulse 76   Temp (!) 96.9 F (36.1 C) (Temporal)   Resp 18   Wt 232 lb 3.2 oz (105.3 kg)   HC 69" (175.3 cm)   SpO2 97%   BMI 34.29 kg/m       Objective:   Physical Exam Constitutional:      General: He is not in acute distress.    Appearance: He is well-developed.  HENT:     Head: Normocephalic and atraumatic.  Cardiovascular:     Rate and Rhythm: Normal rate and regular rhythm.     Heart sounds: No murmur.  Pulmonary:     Effort: Pulmonary effort is normal. No respiratory distress.     Breath sounds: Normal breath sounds. No wheezing or rales.  Skin:    General: Skin is warm and dry.  Neurological:     Mental Status: He is alert and oriented to person, place, and time.  Psychiatric:        Behavior: Behavior normal.        Thought Content: Thought content normal.           Assessment & Plan:  HTN- BP stable. Continue current dose of amlodipine.  Hyperlipidemia- maintained on statin. LDL at goal.   Chronic constipation- encouraged high fiber diet and increased water intake.

## 2019-09-02 ENCOUNTER — Ambulatory Visit (INDEPENDENT_AMBULATORY_CARE_PROVIDER_SITE_OTHER): Payer: 59 | Admitting: Internal Medicine

## 2019-09-02 ENCOUNTER — Encounter: Payer: Self-pay | Admitting: Internal Medicine

## 2019-09-02 VITALS — BP 122/72 | HR 71 | Temp 97.0°F | Ht 69.0 in | Wt 232.0 lb

## 2019-09-02 DIAGNOSIS — R0683 Snoring: Secondary | ICD-10-CM | POA: Insufficient documentation

## 2019-09-02 DIAGNOSIS — F17200 Nicotine dependence, unspecified, uncomplicated: Secondary | ICD-10-CM | POA: Diagnosis not present

## 2019-09-02 DIAGNOSIS — G4733 Obstructive sleep apnea (adult) (pediatric): Secondary | ICD-10-CM | POA: Diagnosis not present

## 2019-09-02 NOTE — Assessment & Plan Note (Signed)
Cigarettes in his 20's, but only occasional cigar since. Better if he quits completely. Education.

## 2019-09-02 NOTE — Assessment & Plan Note (Signed)
High prob significant OSA, Appropriate education done. Discussed eval, Rx options. Plan- HST then CPAP most likely.
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
## 2019-09-02 NOTE — Patient Instructions (Signed)
Order- schedule HST   Dx OSA  Please call us about 2 weeks after your sleep test to see if results and recommendations are ready yet. If appropriate, we may be able to start treatment before we see you next.

## 2019-09-02 NOTE — Telephone Encounter (Signed)
Left message with wife for him to call back to schedule lab only appointment

## 2019-09-15 ENCOUNTER — Encounter: Payer: Self-pay | Admitting: Family

## 2019-09-15 DIAGNOSIS — R5383 Other fatigue: Secondary | ICD-10-CM

## 2019-09-18 ENCOUNTER — Other Ambulatory Visit (INDEPENDENT_AMBULATORY_CARE_PROVIDER_SITE_OTHER): Payer: 59

## 2019-09-18 ENCOUNTER — Telehealth: Payer: Self-pay | Admitting: Internal Medicine

## 2019-09-18 ENCOUNTER — Other Ambulatory Visit: Payer: Self-pay

## 2019-09-18 DIAGNOSIS — R972 Elevated prostate specific antigen [PSA]: Secondary | ICD-10-CM | POA: Diagnosis not present

## 2019-09-18 DIAGNOSIS — R5383 Other fatigue: Secondary | ICD-10-CM

## 2019-09-18 NOTE — Addendum Note (Signed)
Addended by: Kelle Darting A on: 09/18/2019 03:13 PM   Modules accepted: Orders

## 2019-09-21 LAB — TESTOSTERONE TOTAL,FREE,BIO, MALES
Albumin: 4.5 g/dL (ref 3.6–5.1)
Sex Hormone Binding: 37 nmol/L (ref 22–77)
Testosterone, Bioavailable: 111.2 ng/dL (ref >=110.0)
Testosterone, Free: 54.1 pg/mL (ref 46.0–224.0)
Testosterone: 448 ng/dL (ref 250–827)

## 2019-09-21 LAB — PSA: PSA: 1.1 ng/mL (ref <=4.0)

## 2019-09-21 LAB — TSH: TSH: 1.64 mIU/L (ref 0.40–4.50)

## 2019-09-21 NOTE — Telephone Encounter (Signed)
LVM for the patient to make him aware he will be contacted as soon as possible as we are limited to how many HST patients can be taken care of daily due to covid. Advised in message the patient was not forgotten and will be contacted with a day and time to come to clinic to be shown how to use the machine.   Advised to call back if there are any additional questions. Nothing further needed at this time.

## 2019-09-23 ENCOUNTER — Ambulatory Visit: Payer: 59

## 2019-09-23 ENCOUNTER — Other Ambulatory Visit: Payer: Self-pay

## 2019-09-23 DIAGNOSIS — G4733 Obstructive sleep apnea (adult) (pediatric): Secondary | ICD-10-CM

## 2019-09-25 DIAGNOSIS — G4733 Obstructive sleep apnea (adult) (pediatric): Secondary | ICD-10-CM

## 2019-09-25 NOTE — Telephone Encounter (Signed)
mychart message sent by pt requesting to know the results of his sleep study. Dr. Annamaria Boots, please advise on this for pt.

## 2019-09-29 NOTE — Telephone Encounter (Signed)
His home sleep test showed severe obstructive sleep apnea, averaging 54 apneas/ hour, with drops in blood oxygen level.  I suggest we order new DME, new CPAP auto 5-20, mask of choice, humidifier, supplies, AirView/ card  Please make sure he has a return ov in 31-90 days, per insurance regs.

## 2019-09-30 NOTE — Addendum Note (Signed)
Addended by: Parke Poisson E on: 09/30/2019 10:23 AM   Modules accepted: Orders

## 2019-10-22 ENCOUNTER — Telehealth: Payer: Self-pay | Admitting: Internal Medicine

## 2019-10-22 ENCOUNTER — Encounter: Payer: Self-pay | Admitting: Family Medicine

## 2019-10-22 ENCOUNTER — Other Ambulatory Visit: Payer: Self-pay

## 2019-10-22 ENCOUNTER — Ambulatory Visit (HOSPITAL_BASED_OUTPATIENT_CLINIC_OR_DEPARTMENT_OTHER)
Admission: RE | Admit: 2019-10-22 | Discharge: 2019-10-22 | Disposition: A | Discharge status: Home / Self Care | Payer: 59 | Source: Ambulatory Visit | Attending: Family Medicine | Admitting: Family Medicine

## 2019-10-22 ENCOUNTER — Ambulatory Visit (INDEPENDENT_AMBULATORY_CARE_PROVIDER_SITE_OTHER): Payer: 59 | Admitting: Family Medicine

## 2019-10-22 VITALS — BP 147/87 | HR 70 | Temp 98.5°F | Resp 18 | Wt 242.4 lb

## 2019-10-22 DIAGNOSIS — I10 Essential (primary) hypertension: Secondary | ICD-10-CM

## 2019-10-22 DIAGNOSIS — R34 Anuria and oliguria: Secondary | ICD-10-CM | POA: Diagnosis not present

## 2019-10-22 DIAGNOSIS — R0602 Shortness of breath: Secondary | ICD-10-CM | POA: Diagnosis not present

## 2019-10-22 DIAGNOSIS — R600 Localized edema: Secondary | ICD-10-CM | POA: Diagnosis not present

## 2019-10-22 DIAGNOSIS — G4733 Obstructive sleep apnea (adult) (pediatric): Secondary | ICD-10-CM | POA: Diagnosis not present

## 2019-10-22 DIAGNOSIS — Z87448 Personal history of other diseases of urinary system: Secondary | ICD-10-CM | POA: Insufficient documentation

## 2019-10-22 DIAGNOSIS — Z9989 Dependence on other enabling machines and devices: Secondary | ICD-10-CM

## 2019-10-22 LAB — BASIC METABOLIC PANEL
BUN: 17 mg/dL (ref 6–23)
CO2: 29 mEq/L (ref 19–32)
Calcium: 9.5 mg/dL (ref 8.4–10.5)
Chloride: 102 mEq/L (ref 96–112)
Creatinine, Ser: 1.3 mg/dL (ref 0.40–1.50)
GFR: 55.99 mL/min — ABNORMAL LOW (ref >60.00)
Glucose, Bld: 97 mg/dL (ref 70–99)
Potassium: 4.1 mEq/L (ref 3.5–5.1)
Sodium: 139 mEq/L (ref 135–145)

## 2019-10-22 LAB — POC URINALSYSI DIPSTICK (AUTOMATED)
Bilirubin, UA: NEGATIVE
Blood, UA: NEGATIVE
Glucose, UA: NEGATIVE
Ketones, UA: NEGATIVE
Leukocytes, UA: NEGATIVE
Nitrite, UA: NEGATIVE
Protein, UA: NEGATIVE
Spec Grav, UA: 1.015 (ref 1.010–1.025)
Urobilinogen, UA: 0.2 E.U./dL
pH, UA: 7 (ref 5.0–8.0)

## 2019-10-22 MED ORDER — FUROSEMIDE 20 MG PO TABS: 20.0000 mg | ORAL_TABLET | Freq: Every day | ORAL | 0 refills | Status: DC

## 2019-10-22 NOTE — Patient Instructions (Addendum)
Low sodium diet strongly encouraged.  Start the lasix 1 tab a day  Until you follow up with Ryan [name].  Please have xray completed today and [facility].  Once we get all the results we will call you  And guide on a follow up and provide further instructions.    If shortness of breath worsens>> please be seen urgently.   Please call your pulmonologist to report the increased fluid/swelling and decreased urine output since starting CPAP.     Low-Sodium Eating Plan Sodium, which is an element that makes up salt, helps you maintain a healthy balance of fluids in your body. Too much sodium can increase your blood pressure and cause fluid and waste to be held in your body. Your health care provider or dietitian may recommend following this plan if you have high blood pressure (hypertension), kidney disease, liver disease, or heart failure. Eating less sodium can help lower your blood pressure, reduce swelling, and protect your heart, liver, and kidneys. What are tips for following this plan? General guidelines  Most people on this plan should limit their sodium intake to 1,500-2,000 mg (milligrams) of sodium each day. Reading food labels   The Nutrition Facts label lists the amount of sodium in one serving of the food. If you eat more than one serving, you must multiply the listed amount of sodium by the number of servings.  Choose foods with less than 140 mg of sodium per serving.  Avoid foods with 300 mg of sodium or more per serving. Shopping  Look for lower-sodium products, often labeled as "low-sodium" or "no salt added."  Always check the sodium content even if foods are labeled as "unsalted" or "no salt added".  Buy fresh foods. ? Avoid canned foods and premade or frozen meals. ? Avoid canned, cured, or processed meats  Buy breads that have less than 80 mg of sodium per slice. Cooking  Eat more home-cooked food and less restaurant, buffet, and fast  food.  Avoid adding salt when cooking. Use salt-free seasonings or herbs instead of table salt or sea salt. Check with your health care provider or pharmacist before using salt substitutes.  Cook with plant-based oils, such as canola, sunflower, or olive oil. Meal planning  When eating at a restaurant, ask that your food be prepared with less salt or no salt, if possible.  Avoid foods that contain MSG (monosodium glutamate). MSG is sometimes added to [place] food, bouillon, and some canned foods. What foods are recommended? The items listed may not be a complete list. Talk with your dietitian about what dietary choices are best for you. Grains Low-sodium cereals, including oats, puffed wheat and rice, and shredded wheat. Low-sodium crackers. Unsalted rice. Unsalted pasta. Low-sodium bread. Whole-grain breads and whole-grain pasta. Vegetables Fresh or frozen vegetables. "No salt added" canned vegetables. "No salt added" tomato sauce and paste. Low-sodium or reduced-sodium tomato and vegetable juice. Fruits Fresh, frozen, or canned fruit. Fruit juice. Meats and other protein foods Fresh or frozen (no salt added) meat, poultry, seafood, and fish. Low-sodium canned tuna and salmon. Unsalted nuts. Dried peas, beans, and lentils without added salt. Unsalted canned beans. Eggs. Unsalted nut butters. Dairy Milk. Soy milk. Cheese that is naturally low in sodium, such as ricotta cheese, fresh mozzarella, or Swiss cheese Low-sodium or reduced-sodium cheese. Cream cheese. Yogurt. Fats and oils Unsalted butter. Unsalted margarine with no trans fat. Vegetable oils such as canola or olive oils. Seasonings and other foods Fresh and dried herbs  and spices. Salt-free seasonings. Low-sodium mustard and ketchup. Sodium-free salad dressing. Sodium-free light mayonnaise. Fresh or refrigerated horseradish. Lemon juice. Vinegar. Homemade, reduced-sodium, or low-sodium soups. Unsalted popcorn and pretzels. Low-salt  or salt-free chips. What foods are not recommended? The items listed may not be a complete list. Talk with your dietitian about what dietary choices are best for you. Grains Instant hot cereals. Bread stuffing, pancake, and biscuit mixes. Croutons. Seasoned rice or pasta mixes. Noodle soup cups. Boxed or frozen macaroni and cheese. Regular salted crackers. Self-rising flour. Vegetables Sauerkraut, pickled vegetables, and relishes. Olives. [place] fries. Onion rings. Regular canned vegetables (not low-sodium or reduced-sodium). Regular canned tomato sauce and paste (not low-sodium or reduced-sodium). Regular tomato and vegetable juice (not low-sodium or reduced-sodium). Frozen vegetables in sauces. Meats and other protein foods Meat or fish that is salted, canned, smoked, spiced, or pickled. Bacon, ham, sausage, hotdogs, corned beef, chipped beef, packaged lunch meats, salt pork, jerky, pickled herring, anchovies, regular canned tuna, sardines, salted nuts. Dairy Processed cheese and cheese spreads. Cheese curds. Blue cheese. Feta cheese. String cheese. Regular cottage cheese. Buttermilk. Canned milk. Fats and oils Salted butter. Regular margarine. Ghee. Bacon fat. Seasonings and other foods Onion salt, garlic salt, seasoned salt, table salt, and sea salt. Canned and packaged gravies. Worcestershire sauce. Tartar sauce. Barbecue sauce. Teriyaki sauce. Soy sauce, including reduced-sodium. Steak sauce. Fish sauce. Oyster sauce. Cocktail sauce. Horseradish that you find on the shelf. Regular ketchup and mustard. Meat flavorings and tenderizers. Bouillon cubes. Hot sauce and Tabasco sauce. Premade or packaged marinades. Premade or packaged taco seasonings. Relishes. Regular salad dressings. Salsa. Potato and tortilla chips. Corn chips and puffs. Salted popcorn and pretzels. Canned or dried soups. Pizza. Frozen entrees and pot pies. Summary  Eating less sodium can help lower your blood pressure, reduce  swelling, and protect your heart, liver, and kidneys.  Most people on this plan should limit their sodium intake to 1,500-2,000 mg (milligrams) of sodium each day.  Canned, boxed, and frozen foods are high in sodium. Restaurant foods, fast foods, and pizza are also very high in sodium. You also get sodium by adding salt to food.  Try to cook at home, eat more fresh fruits and vegetables, and eat less fast food, canned, processed, or prepared foods. This information is not intended to replace advice given to you by your health care provider. Make sure you discuss any questions you have with your health care provider. Document Revised: 07/26/2017 Document Reviewed: 08/06/2016 Elsevier Patient Education  2020 [employer].

## 2019-10-22 NOTE — Progress Notes (Signed)
This visit occurred during the SARS-CoV-2 public health emergency.  Safety protocols were in place, including screening questions prior to the visit, additional usage of staff PPE, and extensive cleaning of exam room while observing appropriate contact time as indicated for disinfecting solutions.    Ryan [name] , [birth year], 64 y.o., male MRN: [id number] Patient Care Team    Relationship Specialty Notifications Start End  Debbrah Alar, NP PCP - General Internal Medicine  07/15/13   End, Harrell Gave, MD PCP - Cardiology Cardiology Admissions 12/02/17   Deneise Lever, MD Consulting Physician Pulmonary Disease  10/22/19     Chief Complaint  Patient presents with  . hands/ankles swelling    x 2 days     Subjective: Pt presents for an OV with complaints of mild dyspnea, decreased urinary output and swelling of his hands and lower extremities.    Urinary changes: Patient reports since he started his CPAP for his obstructive sleep apnea 2 weeks ago he has had changes in his urinary stream.  He reports he used to get up to go to the bathroom a few times a night.  After starting CPAP that improved a great deal and he was no longer needing to get up to go to the bathroom in the middle the night.  However over the last week he noticed he is having decreased urinary output throughout the day as well.  He reports his fluid intake has not changed over the course of this time.  He denies fever, chills, nausea, vomit, hematuria or burning with urination.  He states he has had discomfort on both sides of his lower back during this time.  He does not have a history of kidney stones or diagnosed BPH.  He denies changes in his urinary stream or urinary hesitancy.  PSA was normal approximately 4 weeks ago.  Dyspnea/edema: Patient endorses having mild shortness of breath.  He reports shortness of breath is mostly with activity.  He noticed feeling winded when walking across the parking lot at  work and today.  He endorses bilateral upper extremity and lower extremity edema of 2-3 days duration.  He states he typically does not add salt to his diet.  And tries to avoid high sodium foods.  He does not feel he consumed anything over the last few days that would have caused edema.  He has never had fluid retention in the past.  He reports his ankles are so swollen he was unable to comfortably lace his boots.  He also had to remove his wedding band since his fingers were so swollen.  He has a significant medical history of coronary artery disease with 50% stenosis of the mid/distal LAD and 20% ostial left circumflex disease.  Section fraction of 50-55% in 2019.  Mild diastolic dysfunction 99991111 echo.  He denies chest pain, diaphoresis, nausea or dizziness.  Patient denies any medication changes.  He states the only thing that has changed in the last few weeks is that he started using a new CPAP.  He is wondering if his symptoms could be coming from his CPAP use.  Patient with normal CMP 07/2019 and TSH 09/18/2019.  Heart Cath 2019:  1. Mild to moderate coronary artery disease, with long 50% stenosis of the mid/distal LAD and 20% ostial LCx disease. 2. Low normal left ventricular contraction (LVEF 50-55%). 3. Upper normal left ventricular filling pressure (LVEDP 15 mmHg).  Allergies  Allergen Reactions  . Atorvastatin Other (See Comments)  Hands swelled  . Tape     Caused redness   Social History   Social History Narrative      Past Medical History:  Diagnosis Date  . Abnormal liver function    history of   . Atypical chest pain   . Blind left eye    blind left eye since birth  . Cancer ([place])    skin:  basal cell carcinoma/ on face,back and neck  . Cataract    Bil  . Diastolic dysfunction    Q000111Q Echo: EF 55-60%, Gr1 DD, triv MR.  . Fatty liver    fatty liver disease  . GERD (gastroesophageal reflux disease)   . History of indigestion   . Hyperlipidemia   .  Hypertension   . Non-obstructive CAD (coronary artery disease)    a. 2016 Neg MV; b. 10/2017 Cath: LM nl, LAD 26m, D1/2/3 nl, LCX 20p, RCA nl, EF 50-55%.  . SOB (shortness of breath) 2019   after climbing stairs   Past Surgical History:  Procedure Laterality Date  . COLONOSCOPY    . LEFT HEART CATH AND CORONARY ANGIOGRAPHY Left 10/29/2017   Procedure: LEFT HEART CATH AND CORONARY ANGIOGRAPHY;  Surgeon: Nelva Bush, MD;  Location: [facility];  Service: Cardiovascular;  Laterality: Left;  . TONSILLECTOMY  1965   as a child   Family History  Problem Relation Age of Onset  . Colon cancer Mother        deceased age 82  . Diabetes Mother   . Hypertension Mother   . Lung cancer Father        deceased age 38  . Heart disease Father        Bypass in mid-late 72's  . Heart attack Father        23  . Diabetes Sister   . Colon polyps Neg Hx   . Esophageal cancer Neg Hx   . Stomach cancer Neg Hx   . Rectal cancer Neg Hx    Allergies as of 10/22/2019      Reactions   Atorvastatin Other (See Comments)   Hands swelled   Tape    Caused redness      Medication List       Accurate as of October 22, 2019  4:00 PM. If you have any questions, ask your nurse or doctor.        amLODipine 5 MG tablet Commonly known as: NORVASC Take 1 tablet (5 mg total) by mouth daily.   aspirin EC 81 MG tablet Take 1 tablet (81 mg total) by mouth daily.   Fish Oil 1200 MG Caps Take 4,800 mg by mouth daily.   furosemide 20 MG tablet Commonly known as: LASIX Take 1 tablet (20 mg total) by mouth daily. Started by: Howard Pouch, DO   psyllium 58.6 % powder Commonly known as: METAMUCIL Take 1 packet by mouth as needed.   rosuvastatin 20 MG tablet Commonly known as: CRESTOR Take 1 tablet (20 mg total) by mouth daily.       All past medical history, surgical history, allergies, family history, immunizations andmedications were updated in the EMR today and reviewed under the  history and medication portions of their EMR.     ROS: Negative, with the exception of above mentioned in HPI   Objective:  BP (!) 147/87 (BP Location: Left Arm, Patient Position: Sitting, Cuff Size: Large)   Pulse 70   Temp 98.5 F (36.9 C) (Temporal)   Resp  18   Wt 242 lb 6 oz (109.9 kg)   SpO2 97%   BMI 35.79 kg/m  Body mass index is 35.79 kg/m. Gen: Afebrile. No acute distress. Nontoxic in appearance, well developed, well nourished.  HENT: AT. [place]. MMM Eyes:Pupils Equal Round Reactive to light, Extraocular movements intact,  Conjunctiva without redness, discharge or icterus. CV: RRR no murmur, +1-2 bilateral pitting edema Chest: CTAB, no wheeze or crackles. Good air movement, normal resp effort.  Skin: no rashes, purpura or petechiae.  Neuro:  Normal gait. PERLA. EOMi. Alert. Oriented x3  Psych: Normal affect, dress and demeanor. Normal speech. Normal thought content and judgment.  No exam data present No results found. Results for orders placed or performed in visit on 10/22/19 (from the past 24 hour(s))  POCT Urinalysis Dipstick (Automated)     Status: None   Collection Time: 10/22/19 12:11 PM  Result Value Ref Range   Color, UA Yellow    Clarity, UA clear    Glucose, UA Negative Negative   Bilirubin, UA Negative    Ketones, UA Negative    Spec Grav, UA 1.015 1.010 - 1.025   Blood, UA Neg    pH, UA 7.0 5.0 - 8.0   Protein, UA Negative Negative   Urobilinogen, UA 0.2 0.2 or 1.0 E.U./dL   Nitrite, UA Neg    Leukocytes, UA Negative Negative    Assessment/Plan: Pat Dlouhy. is a 64 y.o. male present for OV for  Decreased urinary output -Uncertain etiology of his decreased urinary output.  Not certain if he is just seeing improvement in his nocturia secondary to the CPAP versus him having actually decreased urinary output. - POCT Urinalysis Dipstick (Automated)> report is clear and not concentrated.  No RBC, nitrite or leuks.  Will send for formal microanalysis  with reflex culture to be complete. - Basic Metabolic Panel (BMET) - Urinalysis w microscopic + reflex cultur  Shortness of breath/bilateral lower extremity edema/hypertension Lung exam today is clear and his oxygen saturations are normal.  He does have an elevated blood pressure today in comparison to his normal and he does appear mildly fluid overloaded with +1-2 pitting edema of his bilateral lower extremities. -Patient was encouraged to follow a low-sodium diet. - DG Chest 2 View; Future> ordered today. - Lasix 20 mg daily started.  Once labs and chest x-ray results are received will call patient and guide him on continued Lasix use. -We will arrange close follow-up with his primary care provider after labs received.  OSA on CPAP Discussed with patient I am uncertain if this is related to his new CPAP use.  Typically some patients will have an improvement in BPH symptoms and edema with use of CPAP.  However, cannot rule out it is not contributing to his symptoms since symptoms started shortly after routine use. I did encourage him to call his pulmonologist today to make them aware of  symptoms he is having and they can better guide him on whether his new CPAP use could be contributing to his symptoms.    Reviewed expectations re: course of current medical issues.  Discussed self-management of symptoms.  Outlined signs and symptoms indicating need for more acute intervention.  Patient verbalized understanding and all questions were answered.  Patient received an After-Visit Summary.   Orders Placed This Encounter  Procedures  . DG Chest 2 View  . Basic Metabolic Panel (BMET)  . Urinalysis w microscopic + reflex cultur  . POCT Urinalysis Dipstick (Automated)  Meds ordered this encounter  Medications  . furosemide (LASIX) 20 MG tablet    Sig: Take 1 tablet (20 mg total) by mouth daily.    Dispense:  30 tablet    Refill:  0   Referral Orders  No referral(s) requested today     Note is dictated utilizing voice recognition software. Although note has been proof read prior to signing, occasional typographical errors still can be missed. If any questions arise, please do not hesitate to call for verification.   electronically signed by:  Howard Pouch, DO  [place]

## 2019-10-22 NOTE — Telephone Encounter (Signed)
Called and spoke to pt. Pt states he was seen by PCP today, notes are in pt's chart. Pt was advised to call our office. Pt states he has had BIL LE edema x 1.[redacted] weeks along with urine retention and mild SOB. Pt states he is still having the drowsiness, and falling asleep easily when sitting still, and falling asleep when someone is talking to him. Pt states he is wearing his CPAP, he is in [place].   Dr. Annamaria Boots please advise. Thanks.

## 2019-10-23 ENCOUNTER — Encounter: Payer: Self-pay | Admitting: Adult Health

## 2019-10-23 ENCOUNTER — Ambulatory Visit (INDEPENDENT_AMBULATORY_CARE_PROVIDER_SITE_OTHER): Payer: 59 | Admitting: Adult Health

## 2019-10-23 VITALS — BP 126/76 | HR 67 | Temp 97.9°F | Ht 69.0 in | Wt 239.0 lb

## 2019-10-23 DIAGNOSIS — R0602 Shortness of breath: Secondary | ICD-10-CM | POA: Diagnosis not present

## 2019-10-23 DIAGNOSIS — R600 Localized edema: Secondary | ICD-10-CM | POA: Diagnosis not present

## 2019-10-23 DIAGNOSIS — M7989 Other specified soft tissue disorders: Secondary | ICD-10-CM | POA: Insufficient documentation

## 2019-10-23 DIAGNOSIS — G4733 Obstructive sleep apnea (adult) (pediatric): Secondary | ICD-10-CM | POA: Diagnosis not present

## 2019-10-23 DIAGNOSIS — Z9989 Dependence on other enabling machines and devices: Secondary | ICD-10-CM

## 2019-10-23 LAB — URINALYSIS W MICROSCOPIC + REFLEX CULTURE
Bacteria, UA: NONE SEEN /HPF
Bilirubin Urine: NEGATIVE
Glucose, UA: NEGATIVE
Hgb urine dipstick: NEGATIVE
Hyaline Cast: NONE SEEN /LPF
Ketones, ur: NEGATIVE
Leukocyte Esterase: NEGATIVE
Nitrites, Initial: NEGATIVE
Protein, ur: NEGATIVE
Specific Gravity, Urine: 1.019 (ref 1.001–1.03)
Squamous Epithelial / HPF: NONE SEEN /HPF (ref <=5)
WBC, UA: NONE SEEN /HPF (ref 0–5)
pH: 7 (ref 5.0–8.0)

## 2019-10-23 LAB — BRAIN NATRIURETIC PEPTIDE: Pro B Natriuretic peptide (BNP): 21 pg/mL (ref 0.0–100.0)

## 2019-10-23 LAB — NO CULTURE INDICATED

## 2019-10-23 NOTE — Telephone Encounter (Signed)
Pt's wife called back and stated Dr. Annamaria Boots wanted him to be seen.  Scheduled appt w/Tammy Parrett for this afternoon.  Please advise.

## 2019-10-23 NOTE — Patient Instructions (Addendum)
Discuss with DME new mask .  Labs today  Adjust CPAP pressure to 10 to 20cm.  Take 2 Lasix 20mg  daily in am only then resume 20mg  daily dosing .  Low salt diet  Avoid NSAIDS  Follow up in 2 weeks with Dr. Annamaria Boots  And As needed   Please contact office for sooner follow up if symptoms do not improve or worsen or seek emergency care

## 2019-10-23 NOTE — Addendum Note (Signed)
Addended by: Parke Poisson E on: 10/23/2019 03:46 PM   Modules accepted: Orders

## 2019-10-23 NOTE — Telephone Encounter (Signed)
Called and spoke with pt stating to him to keep his scheduled appt that was made for him at 2:30 with TP and pt verbalized understanding. Nothing further needed.

## 2019-10-23 NOTE — Assessment & Plan Note (Signed)
Bilateral lower extremity swelling, fluid retention in hands and stomach.  Patient's weight has trended up.  Suspect this may be a component of diastolic dysfunction. We will check lab work with a BNP. Is substantially elevated will order a 2D echo Does not appear to be in acute volume overload at this time.  Chest x-ray is clear. We will continue on diuresis as kidney function allows  Plan  Patient Instructions  Discuss with DME new mask .  Labs today  Adjust CPAP pressure to 10 to 20cm.  Take 2 Lasix 20mg  daily in am only then resume 20mg  daily dosing .  Low salt diet  Avoid NSAIDS  Follow up in 2 weeks with Dr. Annamaria Boots  And As needed   Please contact office for sooner follow up if symptoms do not improve or worsen or seek emergency care

## 2019-10-23 NOTE — Progress Notes (Signed)
@Patient  ID: Ryan [name]., male    DOB: 08/29/1955, 64 y.o.   MRN: [id number]  Chief Complaint  Patient presents with  . Follow-up    OSA     Referring provider: Debbrah Alar, NP  HPI: 64 year old male seen for pulmonary consult January 2021 for daytime sleepiness and snoring.  Found to have severe sleep apnea,  TEST/EVENTS :  Home sleep study 09/25/2019 AHI 54.2/hour O2 saturation desaturation at 64%  10/23/2019 Acute OV  Patient presents for an acute office visit for swelling in his feet and hands. Patient was seen last month for sleep evaluation for daytime sleepiness.  He underwent a home sleep study September 25, 2019 that showed severe sleep apnea with a AHI of 54.  Patient was started on CPAP.  Patient says he has been doing well on CPAP.  He does feel that his sleepiness has decreased slightly.  He has had a CPAP for the last 10 days.  He has excellent compliance since receiving his CPAP he wears it each night for about 7 hours AHI is 9.  (5 central events) patient says right when he puts on his CPAP he feels like the pressures are not quite strong enough.  He also wants a new mask.  Patient says about 2 days ago he noticed that he was retaining fluid in both of his feet and his ankles and lower legs were swollen and his hands felt tight.  He also felt tight around his midsection.  He has no orthopnea or increased shortness of breath no cough no hemoptysis no fever no calf pain.  He has no recent travel no known injuries.  Patient was seen by his primary care yesterday.  Had a chest x-ray that was clear.  Lab work serum creatinine is slightly elevated from 1.16-1.30.  Echo 2018 showed EF 55 to 123456, grade 1 diastolic dysfunction.  Left heart cath March 2019 showed mild to moderate coronary artery disease.  EF 50 to 55%.. Weight has trended up 10 pounds over the last year.  He was started on Lasix 20 mg.  He says this has helped some and weight is down 3 pounds since yesterday.   He denies any new medication.  He denies any nonsteroidal intake.  He is on Norvasc 5 mg daily.    Allergies  Allergen Reactions  . Atorvastatin Other (See Comments)    Hands swelled  . Tape     Caused redness    Immunization History  Administered Date(s) Administered  . Influenza, Seasonal, Injecte, Preservative Fre 07/15/2013, 07/06/2014, 05/09/2015  . Influenza,inj,Quad PF,6+ Mos 07/15/2013, 07/06/2014, 05/09/2015, 05/14/2016, 05/26/2018, 06/19/2019  . Tdap 10/23/2011    Past Medical History:  Diagnosis Date  . Abnormal liver function    history of   . Atypical chest pain   . Blind left eye    blind left eye since birth  . Cancer ([place])    skin:  basal cell carcinoma/ on face,back and neck  . Cataract    Bil  . Diastolic dysfunction    Q000111Q Echo: EF 55-60%, Gr1 DD, triv MR.  . Fatty liver    fatty liver disease  . GERD (gastroesophageal reflux disease)   . History of indigestion   . Hyperlipidemia   . Hypertension   . Non-obstructive CAD (coronary artery disease)    a. 2016 Neg MV; b. 10/2017 Cath: LM nl, LAD 25m, D1/2/3 nl, LCX 20p, RCA nl, EF 50-55%.  . SOB (shortness of breath)  2019   after climbing stairs    Tobacco History: Social History   Tobacco Use  Smoking Status Light Tobacco Smoker  . Types: Cigars  Smokeless Tobacco Never Used  Tobacco Comment   occasional cigar   Ready to quit: Not Answered Counseling given: Not Answered Comment: occasional cigar   Outpatient Medications Prior to Visit  Medication Sig Dispense Refill  . amLODipine (NORVASC) 5 MG tablet Take 1 tablet (5 mg total) by mouth daily. 90 tablet 1  . aspirin EC 81 MG tablet Take 1 tablet (81 mg total) by mouth daily. 90 tablet 3  . furosemide (LASIX) 20 MG tablet Take 1 tablet (20 mg total) by mouth daily. 30 tablet 0  . Omega-3 Fatty Acids (FISH OIL) 1200 MG CAPS Take 4,800 mg by mouth daily.     . psyllium (METAMUCIL) 58.6 % powder Take 1 packet by mouth as needed.    .  rosuvastatin (CRESTOR) 20 MG tablet Take 1 tablet (20 mg total) by mouth daily. 90 tablet 1   No facility-administered medications prior to visit.     Review of Systems:   Constitutional:   No  weight loss, night sweats,  Fevers, chills, fatigue, or  lassitude.  HEENT:   No headaches,  Difficulty swallowing,  Tooth/dental problems, or  Sore throat,                No sneezing, itching, ear ache, nasal congestion, post nasal drip,   CV:  No chest pain,  Orthopnea, PND,  anasarca, dizziness, palpitations, syncope.   GI  No heartburn, indigestion, abdominal pain, nausea, vomiting, diarrhea, change in bowel habits, loss of appetite, bloody stools.   Resp: No shortness of breath with exertion or at rest.  No excess mucus, no productive cough,  No non-productive cough,  No coughing up of blood.  No change in color of mucus.  No wheezing.  No chest wall deformity  Skin: no rash or lesions.  GU: no dysuria, change in color of urine, no urgency or frequency.  No flank pain, no hematuria   MS:  No joint pain or swelling.  No decreased range of motion.  No back pain.    Physical Exam  BP 126/76 (BP Location: Left Arm, Cuff Size: Normal)   Pulse 67   Temp 97.9 F (36.6 C) (Temporal)   Ht 5\' 9"  (1.753 m)   Wt 239 lb (108.4 kg)   SpO2 98% Comment: RA  BMI 35.29 kg/m   GEN: A/Ox3; pleasant , NAD, BMI 35   HEENT:  [place]/AT,   NOSE-clear, THROAT-clear, no lesions, no postnasal drip or exudate noted.   NECK:  Supple w/ fair ROM; no JVD; normal carotid impulses w/o bruits; no thyromegaly or nodules palpated; no lymphadenopathy.    RESP  Clear  P & A; w/o, wheezes/ rales/ or rhonchi. no accessory muscle use, no dullness to percussion  CARD:  RRR, no m/r/g, 2-3 + peripheral edema, pulses intact, no cyanosis or clubbing. Negative Homans' sign  GI:   Soft & nt; nml bowel sounds; no organomegaly or masses detected.   Musco: Warm bil, no deformities or joint swelling noted.   Neuro: alert,  no focal deficits noted.    Skin: Warm, no lesions or rashes    Lab Results:  CBC    Component Value Date/Time   WBC 10.9 (H) 08/26/2019 0854   RBC 5.03 08/26/2019 0854   HGB 15.2 08/26/2019 0854   HGB 15.4 10/23/2017 0918  HCT 44.1 08/26/2019 0854   HCT 43.3 10/23/2017 0918   PLT 226.0 08/26/2019 0854   PLT 220 10/23/2017 0918   MCV 87.7 08/26/2019 0854   MCV 84 10/23/2017 0918   MCH 30.1 02/07/2018 1658   MCHC 34.4 08/26/2019 0854   RDW 13.0 08/26/2019 0854   RDW 13.0 10/23/2017 0918   LYMPHSABS 1.3 08/26/2019 0854   LYMPHSABS 1.4 10/23/2017 0918   MONOABS 0.6 08/26/2019 0854   EOSABS 0.3 08/26/2019 0854   EOSABS 0.2 10/23/2017 0918   BASOSABS 0.0 08/26/2019 0854   BASOSABS 0.0 10/23/2017 0918    BMET    Component Value Date/Time   NA 139 10/22/2019 1223   NA 139 10/23/2017 0918   K 4.1 10/22/2019 1223   CL 102 10/22/2019 1223   CO2 29 10/22/2019 1223   GLUCOSE 97 10/22/2019 1223   BUN 17 10/22/2019 1223   BUN 16 10/23/2017 0918   CREATININE 1.30 10/22/2019 1223   CREATININE 1.30 (H) 02/07/2018 1658   CALCIUM 9.5 10/22/2019 1223   GFRNONAA 69 10/23/2017 0918   GFRAA 80 10/23/2017 0918    BNP No results found for: BNP  ProBNP No results found for: PROBNP  Imaging: DG Chest 2 View  Result Date: 10/22/2019 CLINICAL DATA:  Shortness of breath and peripheral edema for 2 weeks. EXAM: CHEST - 2 VIEW COMPARISON:  03/20/2006 FINDINGS: The heart size and mediastinal contours are within normal limits. Both lungs are clear. The visualized skeletal structures are unremarkable. IMPRESSION: No active cardiopulmonary disease. Electronically Signed   By: Marlaine Hind M.D.   On: 10/22/2019 17:17      No flowsheet data found.  No results found for: NITRICOXIDE      Assessment & Plan:   OSA on CPAP Severe sleep apnea patient has significant improvement since starting CPAP with decreased AHI on CPAP.  Will adjust CPAP pressure 10-20 to see if this will  decrease the number of events.  Of also ordered a new mask for him for better mask fit.  We will get a CPAP download on return  Leg swelling Bilateral lower extremity swelling, fluid retention in hands and stomach.  Patient's weight has trended up.  Suspect this may be a component of diastolic dysfunction. We will check lab work with a BNP. Is substantially elevated will order a 2D echo Does not appear to be in acute volume overload at this time.  Chest x-ray is clear. We will continue on diuresis as kidney function allows  Plan  Patient Instructions  Discuss with DME new mask .  Labs today  Adjust CPAP pressure to 10 to 20cm.  Take 2 Lasix 20mg  daily in am only then resume 20mg  daily dosing .  Low salt diet  Avoid NSAIDS  Follow up in 2 weeks with Dr. Annamaria Boots  And As needed   Please contact office for sooner follow up if symptoms do not improve or worsen or seek emergency care           Rexene Edison, NP 10/23/2019

## 2019-10-23 NOTE — Assessment & Plan Note (Signed)
Severe sleep apnea patient has significant improvement since starting CPAP with decreased AHI on CPAP.  Will adjust CPAP pressure 10-20 to see if this will decrease the number of events.  Of also ordered a new mask for him for better mask fit.  We will get a CPAP download on return

## 2019-10-26 NOTE — Telephone Encounter (Signed)
Copied from 2/26 OV with TP.  Tammy please advise on how long pt needs to take 2 tabs of Lasix qam.  Thanks!   Discuss with DME new mask .  Labs today  Adjust CPAP pressure to 10 to 20cm.  Take 2 Lasix 20mg  daily in am only then resume 20mg  daily dosing .  Low salt diet  Avoid NSAIDS  Follow up in 2 weeks with Dr. Annamaria Boots  And As needed   Please contact office for sooner follow up if symptoms do not improve or worsen or seek emergency care

## 2019-10-27 NOTE — Telephone Encounter (Signed)
Dr. Annamaria Boots, please see pt's mychart messages he has sent and also previous responses from staff and advise on all this for pt. Thanks!  Allergies  Allergen Reactions  . Atorvastatin Other (See Comments)    Hands swelled  . Tape     Caused redness     Current Outpatient Medications:  .  amLODipine (NORVASC) 5 MG tablet, Take 1 tablet (5 mg total) by mouth daily., Disp: 90 tablet, Rfl: 1 .  aspirin EC 81 MG tablet, Take 1 tablet (81 mg total) by mouth daily., Disp: 90 tablet, Rfl: 3 .  furosemide (LASIX) 20 MG tablet, Take 1 tablet (20 mg total) by mouth daily., Disp: 30 tablet, Rfl: 0 .  Omega-3 Fatty Acids (FISH OIL) 1200 MG CAPS, Take 4,800 mg by mouth daily. , Disp: , Rfl:  .  psyllium (METAMUCIL) 58.6 % powder, Take 1 packet by mouth as needed., Disp: , Rfl:  .  rosuvastatin (CRESTOR) 20 MG tablet, Take 1 tablet (20 mg total) by mouth daily., Disp: 90 tablet, Rfl: 1

## 2019-10-27 NOTE — Telephone Encounter (Signed)
CPAP is not causing the peripheral edema- if anything it will help. Norvasc is a calcium channel blocker and these can cause this kind of edema sometimes. I think he needs to see his cardiologist- Dr Candis Musa. A number of diferent conditions can cause edema, including problems with the heart, kidneys, liver and protein levels in the blood.

## 2019-10-29 ENCOUNTER — Other Ambulatory Visit: Payer: Self-pay

## 2019-10-29 ENCOUNTER — Ambulatory Visit (INDEPENDENT_AMBULATORY_CARE_PROVIDER_SITE_OTHER): Payer: 59 | Admitting: Nurse Practitioner

## 2019-10-29 ENCOUNTER — Encounter: Payer: Self-pay | Admitting: Nurse Practitioner

## 2019-10-29 VITALS — BP 134/70 | HR 72 | Ht 69.0 in | Wt 238.0 lb

## 2019-10-29 DIAGNOSIS — I5031 Acute diastolic (congestive) heart failure: Secondary | ICD-10-CM

## 2019-10-29 DIAGNOSIS — I2510 Atherosclerotic heart disease of native coronary artery without angina pectoris: Secondary | ICD-10-CM

## 2019-10-29 DIAGNOSIS — E785 Hyperlipidemia, unspecified: Secondary | ICD-10-CM

## 2019-10-29 DIAGNOSIS — I10 Essential (primary) hypertension: Secondary | ICD-10-CM

## 2019-10-29 DIAGNOSIS — I25118 Atherosclerotic heart disease of native coronary artery with other forms of angina pectoris: Secondary | ICD-10-CM

## 2019-10-29 MED ORDER — FUROSEMIDE 20 MG PO TABS: 40.0000 mg | ORAL_TABLET | Freq: Every day | ORAL | 0 refills | Status: DC

## 2019-10-29 NOTE — Progress Notes (Signed)
Office Visit    Patient Name: Ryan [name]. Date of Encounter: 10/29/2019  Primary Care Provider:  Debbrah Alar, NP Primary Cardiologist:  Nelva Bush, MD  Chief Complaint    64 y/o ? with a history of atypical chest pain/nonobstructive CAD, hypertension, hyperlipidemia, GERD, nonobstructive CAD, who presents for follow-up related to lower extremity edema.  Past Medical History    Past Medical History:  Diagnosis Date  . Abnormal liver function    history of   . Atypical chest pain   . Blind left eye    blind left eye since birth  . Cancer ([place])    skin:  basal cell carcinoma/ on face,back and neck  . Cataract    Bil  . Diastolic dysfunction    Q000111Q Echo: EF 55-60%, Gr1 DD, triv MR.  . Fatty liver    fatty liver disease  . GERD (gastroesophageal reflux disease)   . History of indigestion   . Hyperlipidemia   . Hypertension   . Non-obstructive CAD (coronary artery disease)    a. 2016 Neg MV; b. 10/2017 Cath: LM nl, LAD 60m, D1/2/3 nl, LCX 20p, RCA nl, EF 50-55%.  . SOB (shortness of breath) 2019   after climbing stairs   Past Surgical History:  Procedure Laterality Date  . COLONOSCOPY    . LEFT HEART CATH AND CORONARY ANGIOGRAPHY Left 10/29/2017   Procedure: LEFT HEART CATH AND CORONARY ANGIOGRAPHY;  Surgeon: Nelva Bush, MD;  Location: [facility];  Service: Cardiovascular;  Laterality: Left;  . TONSILLECTOMY  1965   as a child    Allergies  Allergies  Allergen Reactions  . Atorvastatin Other (See Comments)    Hands swelled  . Tape     Caused redness    History of Present Illness    64 year old male with above complex past medical history including hypertension, hyperlipidemia, GERD, hepatic steatosis, and atypical chest pain with previous negative ischemic evaluation 2016.  Echocardiogram in early 2018 showed normal LV function.  In early 2019, he was evaluated with complaints of exertional substernal chest pain and  tightness and underwent diagnostic catheterization revealing nonobstructive CAD as outlined above in the past medical history.  With medical management of hyperlipidemia, he has had significant improvement in LDL-down to 72 in December 2020.  He says that he had been doing well since his last cardiology visit however, over the past 2 to 3 weeks, he started to notice increasing lower extremity edema.  He is on amlodipine but has been on this for a year without any recent change in dose.  Further, he has not had any recent change in sodium intake or his diet in general.  If anything, he thinks he is probably eating a little bit better-lots of grilled fish.  He was recently diagnosed with sleep apnea and says that he thinks that the swelling started sometime after being placed on CPAP.  He does not routinely weigh himself however, his weight increased to 242 pounds at the end of February, which was up 10 pounds since early January.  Despite increasing weight and lower extremity swelling, he denies dyspnea, chest pain, palpitations, PND, orthopnea, dizziness, syncope, or early satiety.  He was seen by his primary care provider on February 25 and was placed on Lasix 20 mg daily.  Since then, his weight has come down 4 pounds.  He does not think that the Lasix has caused him to urinate significantly more than prior baseline.  Home  Medications    Prior to Admission medications   Medication Sig Start Date End Date Taking? Authorizing Provider  amLODipine (NORVASC) 5 MG tablet Take 1 tablet (5 mg total) by mouth daily. 09/01/19 11/30/19 Yes Debbrah Alar, NP  aspirin EC 81 MG tablet Take 1 tablet (81 mg total) by mouth daily. 10/23/16  Yes End, Harrell Gave, MD  furosemide (LASIX) 20 MG tablet Take 1 tablet (20 mg total) by mouth daily. 10/22/19  Yes Kuneff, Renee A, DO  Omega-3 Fatty Acids (FISH OIL) 1200 MG CAPS Take 4,800 mg by mouth daily.    Yes [provider]  psyllium (METAMUCIL) 58.6 % powder  Take 1 packet by mouth as needed.   Yes [provider]  rosuvastatin (CRESTOR) 20 MG tablet Take 1 tablet (20 mg total) by mouth daily. 09/01/19 11/30/19 Yes Debbrah Alar, NP    Review of Systems    Increased lower extremity swelling and weight gain over the past 2 to 3 weeks.  He denies chest pain, palpitations, dyspnea, PND, orthopnea, dizziness, syncope, edema, or early satiety.  All other systems reviewed and are otherwise negative except as noted above.  Physical Exam    VS:  BP 134/70 (BP Location: Left Arm, Patient Position: Sitting, Cuff Size: Large)   Pulse 72   Ht 5\' 9"  (1.753 m)   Wt 238 lb (108 kg)   SpO2 97%   BMI 35.15 kg/m  , BMI Body mass index is 35.15 kg/m. GEN: Well nourished, well developed, in no acute distress. HEENT: normal. Neck: Supple, difficult to gauge JVP secondary to body habitus.  No carotid bruits, or masses. Cardiac: RRR, no murmurs, rubs, or gallops. No clubbing, cyanosis.  2-3+ bilateral lower extremity edema extending up to his knees.  Radials/PT 2+ and equal bilaterally.  Respiratory:  Respirations regular and unlabored, clear to auscultation bilaterally. GI: Soft, nontender, nondistended, BS + x 4. MS: no deformity or atrophy. Skin: warm and dry, no rash. Neuro:  Strength and sensation are intact. Psych: Normal affect.  Accessory Clinical Findings    ECG personally reviewed by me today -regular sinus rhythm, 72 - no acute changes.  Lab Results  Component Value Date   WBC 10.9 (H) 08/26/2019   HGB 15.2 08/26/2019   HCT 44.1 08/26/2019   MCV 87.7 08/26/2019   PLT 226.0 08/26/2019   Lab Results  Component Value Date   CREATININE 1.30 10/22/2019   BUN 17 10/22/2019   NA 139 10/22/2019   K 4.1 10/22/2019   CL 102 10/22/2019   CO2 29 10/22/2019   Lab Results  Component Value Date   ALT 28 08/26/2019   AST 25 08/26/2019   ALKPHOS 79 08/26/2019   BILITOT 0.6 08/26/2019   Lab Results  Component Value Date   CHOL 143  08/26/2019   HDL 58.10 08/26/2019   LDLCALC 72 08/26/2019   LDLDIRECT 144.3 11/17/2007   TRIG 64.0 08/26/2019   CHOLHDL 2 08/26/2019     Assessment & Plan    1.  Acute congestive heart failure/lower extremity edema: Over the past 2 to 3 weeks, patient has been experiencing increasing lower extremity edema.  Weight increased 10 pounds since January and is now back down 4 pounds after being placed on 20 mg of Lasix daily on February 25.  He still has significant bilateral lower extremity edema.  He denies any dyspnea or orthopnea.  He is on amlodipine however has been on this for a least a year and has not any  recent changes in dose to account for increased swelling.  He does not think that he has changed his diet in any way that would add more sodium or fluid.  Given ongoing significant lower extremity swelling, I will increase his Lasix to 40 mg daily and follow-up a basic metabolic panel today.  We will also plan to follow-up basic metabolic panel in 1 week given change in diuretic dose.  I will arrange for an echocardiogram and plan to see him back within the next 2 weeks.  2.  Nonobstructive CAD: Status post catheterization March 2019 revealing moderate LAD and circumflex disease with normal RCA.  He has not been having any chest pain.  Following up echocardiogram in the setting of above.  Continue aspirin and statin therapy.  3.  Essential hypertension: Minimally elevated at 134/70 today.  Adjusting diuretic as above.  4.  Hyperlipidemia: On rosuvastatin with an LDL of 72 in December.  LFTs were normal at that time.  Continue current therapy.  5.  Disposition: Follow-up lab work today and again in 1 week given change diuretic dose.  Follow-up echocardiogram.  Follow-up in clinic in 2 to 3 weeks or sooner if necessary.  Murray Hodgkins, NP 10/29/2019, 2:39 PM

## 2019-10-29 NOTE — Patient Instructions (Signed)
Medication Instructions:  1- INCREASE Lasix Take 2 tablets (40 mg total) by mouth daily *If you need a refill on your cardiac medications before your next appointment, please call your pharmacy*   Lab Work: 1- Your physician recommends that you have lab work today(BMET)  2- Repeat lab in 1 week at ([place]), [place] printed.  If you have labs (blood work) drawn today and your tests are completely normal, you will receive your results only by: Marland Kitchen MyChart Message (if you have MyChart) OR . A paper copy in the mail If you have any lab test that is abnormal or we need to change your treatment, we will call you to review the results.   Testing/Procedures: 1- Echo  Please return to [facility] on ______________ at _______________ AM/PM for an Echocardiogram. Your physician has requested that you have an echocardiogram. Echocardiography is a painless test that uses sound waves to create images of your heart. It provides your doctor with information about the size and shape of your heart and how well your heart's chambers and valves are working. This procedure takes approximately one hour. There are no restrictions for this procedure. Please note; depending on visual quality an IV may need to be placed.     Follow-Up: At [facility], you and your health needs are our priority.  As part of our continuing mission to provide you with exceptional heart care, we have created designated Provider Care Teams.  These Care Teams include your primary Cardiologist (physician) and Advanced Practice Providers (APPs -  Physician Assistants and Nurse Practitioners) who all work together to provide you with the care you need, when you need it.  We recommend signing up for the patient portal called "MyChart".  Sign up information is provided on this After Visit Summary.  MyChart is used to connect with patients for Virtual Visits (Telemedicine).  Patients are able to view lab/test results,  encounter notes, upcoming appointments, etc.  Non-urgent messages can be sent to your provider as well.   To learn more about what you can do with MyChart, go to [email or web address].    Your next appointment:   2 week(s)  The format for your next appointment:   In Person  Provider:    You may see Nelva Bush, MD or Murray Hodgkins, NP.

## 2019-10-30 LAB — BASIC METABOLIC PANEL
BUN/Creatinine Ratio: 13 (ref 10–24)
BUN: 16 mg/dL (ref 8–27)
CO2: 24 mmol/L (ref 20–29)
Calcium: 9.5 mg/dL (ref 8.6–10.2)
Chloride: 101 mmol/L (ref 96–106)
Creatinine, Ser: 1.19 mg/dL (ref 0.76–1.27)
GFR calc Af Amer: 76 mL/min/{1.73_m2} (ref >59)
GFR calc non Af Amer: 66 mL/min/{1.73_m2} (ref >59)
Glucose: 112 mg/dL — ABNORMAL HIGH (ref 65–99)
Potassium: 4.5 mmol/L (ref 3.5–5.2)
Sodium: 140 mmol/L (ref 134–144)

## 2019-11-02 ENCOUNTER — Encounter: Payer: Self-pay | Admitting: Family Medicine

## 2019-11-05 ENCOUNTER — Ambulatory Visit (INDEPENDENT_AMBULATORY_CARE_PROVIDER_SITE_OTHER): Payer: 59

## 2019-11-05 ENCOUNTER — Other Ambulatory Visit: Payer: Self-pay

## 2019-11-05 DIAGNOSIS — I5031 Acute diastolic (congestive) heart failure: Secondary | ICD-10-CM | POA: Diagnosis not present

## 2019-11-05 MED ORDER — PERFLUTREN LIPID MICROSPHERE
1.0000 mL | INTRAVENOUS | Status: AC | PRN
  Administered 2019-11-05: 2 mL via INTRAVENOUS

## 2019-11-06 ENCOUNTER — Ambulatory Visit (INDEPENDENT_AMBULATORY_CARE_PROVIDER_SITE_OTHER): Payer: 59 | Admitting: Adult Health

## 2019-11-06 ENCOUNTER — Telehealth: Payer: Self-pay

## 2019-11-06 ENCOUNTER — Encounter: Payer: Self-pay | Admitting: Adult Health

## 2019-11-06 DIAGNOSIS — I5032 Chronic diastolic (congestive) heart failure: Secondary | ICD-10-CM

## 2019-11-06 DIAGNOSIS — G4733 Obstructive sleep apnea (adult) (pediatric): Secondary | ICD-10-CM

## 2019-11-06 DIAGNOSIS — Z9989 Dependence on other enabling machines and devices: Secondary | ICD-10-CM

## 2019-11-06 DIAGNOSIS — I5030 Unspecified diastolic (congestive) heart failure: Secondary | ICD-10-CM | POA: Insufficient documentation

## 2019-11-06 NOTE — Telephone Encounter (Signed)
Attempted to call patient. LMTCB 11/06/2019   

## 2019-11-06 NOTE — Assessment & Plan Note (Signed)
Severe obstructive sleep apnea.  Patient is doing well on nocturnal CPAP. Continue on current pressure setting.  We will recheck a CPAP in 6 weeks.  Plan  Patient Instructions  Discuss with DME new mask . -Check [email or web address] for mask prices.  Follow up in 6 weeks with Dr. Annamaria Boots  And As needed   Please contact office for sooner follow up if symptoms do not improve or worsen or seek emergency care

## 2019-11-06 NOTE — Progress Notes (Signed)
@Patient  ID: Ryan Pandy., male    DOB: 1956-06-26, 64 y.o.   MRN: [id number]  Chief Complaint  Patient presents with  . Follow-up    OSA     Referring provider: Debbrah Alar, NP  HPI: 64 year old male seen for pulmonary consult January 2021 for daytime sleepiness and snoring found to have severe obstructive sleep apnea started on nocturnal CPAP  TEST/EVENTS :  Home sleep study 09/25/2019 AHI 54.2/hour O2 saturation desaturation at 64%  11/06/2019 Follow up : OSA  Patient returns for a follow-up for sleep apnea.  Patient was recently diagnosed with severe obstructive sleep apnea is recently started on CPAP.  Patient says he is starting to do well on CPAP.  He is trying to wear it each night.  Patient is getting in about 7 hours each night.  AHI is slightly better at 8/hour.  Patient's CPAP pressure was recently adjusted 10 to 20 cm H2O.  Patient says this only got done a few nights ago.  Patient feels like that the pressure is some better.  He also is trying to change mask but did not want to pay extra money because it was not time for his supplies to be changed.  Last visit patient had increased lower extremity edema.  Chest x-ray was clear.  Patient was started on Lasix 20 mg.  Patient was referred to cardiology.  Repeat echo showed grade 2 diastolic dysfunction.  Patient's Lasix has been increased to 40 mg daily last week.Marland Kitchen He denies any shortness of breath.   Allergies  Allergen Reactions  . Atorvastatin Other (See Comments)    Hands swelled  . Tape     Caused redness    Immunization History  Administered Date(s) Administered  . Influenza, Seasonal, Injecte, Preservative Fre 07/15/2013, 07/06/2014, 05/09/2015  . Influenza,inj,Quad PF,6+ Mos 07/15/2013, 07/06/2014, 05/09/2015, 05/14/2016, 05/26/2018, 06/19/2019  . Tdap 10/23/2011    Past Medical History:  Diagnosis Date  . Abnormal liver function    history of   . Atypical chest pain   . Blind left eye    blind left eye since birth  . Cancer ([place])    skin:  basal cell carcinoma/ on face,back and neck  . Cataract    Bil  . Diastolic dysfunction    Q000111Q Echo: EF 55-60%, Gr1 DD, triv MR.  . Fatty liver    fatty liver disease  . GERD (gastroesophageal reflux disease)   . History of indigestion   . Hyperlipidemia   . Hypertension   . Non-obstructive CAD (coronary artery disease)    a. 2016 Neg MV; b. 10/2017 Cath: LM nl, LAD 66m, D1/2/3 nl, LCX 20p, RCA nl, EF 50-55%.  . SOB (shortness of breath) 2019   after climbing stairs    Tobacco History: Social History   Tobacco Use  Smoking Status Light Tobacco Smoker  . Types: Cigars  Smokeless Tobacco Never Used  Tobacco Comment   occasional cigar   Ready to quit: Not Answered Counseling given: Not Answered Comment: occasional cigar   Outpatient Medications Prior to Visit  Medication Sig Dispense Refill  . amLODipine (NORVASC) 5 MG tablet Take 1 tablet (5 mg total) by mouth daily. 90 tablet 1  . aspirin EC 81 MG tablet Take 1 tablet (81 mg total) by mouth daily. 90 tablet 3  . furosemide (LASIX) 20 MG tablet Take 2 tablets (40 mg total) by mouth daily. 30 tablet 0  . Omega-3 Fatty Acids (FISH OIL) 1200 MG CAPS Take  4,800 mg by mouth daily.     . psyllium (METAMUCIL) 58.6 % powder Take 1 packet by mouth as needed.    . rosuvastatin (CRESTOR) 20 MG tablet Take 1 tablet (20 mg total) by mouth daily. 90 tablet 1   No facility-administered medications prior to visit.     Review of Systems:   Constitutional:   No  weight loss, night sweats,  Fevers, chills, fatigue, or  lassitude.  HEENT:   No headaches,  Difficulty swallowing,  Tooth/dental problems, or  Sore throat,                No sneezing, itching, ear ache, nasal congestion, post nasal drip,   CV:  No chest pain,  Orthopnea, PND, +swelling in lower extremities, no anasarca, dizziness, palpitations, syncope.   GI  No heartburn, indigestion, abdominal pain, nausea,  vomiting, diarrhea, change in bowel habits, loss of appetite, bloody stools.   Resp:  .  No chest wall deformity  Skin: no rash or lesions.  GU: no dysuria, change in color of urine, no urgency or frequency.  No flank pain, no hematuria   MS:  No joint pain or swelling.  No decreased range of motion.  No back pain.    Physical Exam  BP 125/83 (BP Location: Left Arm, Cuff Size: Large)   Pulse 77   Temp (!) 97.4 F (36.3 C) (Temporal)   Ht 5\' 9"  (1.753 m)   Wt 239 lb 3.2 oz (108.5 kg)   SpO2 96% Comment: RA  BMI 35.32 kg/m   GEN: A/Ox3; pleasant , NAD, BMI 35   HEENT:  [place]/AT,  , NOSE-clear, THROAT-clear, no lesions, no postnasal drip or exudate noted.   NECK:  Supple w/ fair ROM; no JVD; normal carotid impulses w/o bruits; no thyromegaly or nodules palpated; no lymphadenopathy.    RESP  Clear  P & A; w/o, wheezes/ rales/ or rhonchi. no accessory muscle use, no dullness to percussion  CARD:  RRR, no m/r/g, 1+  peripheral edema, pulses intact, no cyanosis or clubbing.  GI:   Soft & nt; nml bowel sounds; no organomegaly or masses detected.   Musco: Warm bil, no deformities or joint swelling noted.   Neuro: alert, no focal deficits noted.    Skin: Warm, no lesions or rashes    Lab Results:    BNP No results found for: BNP  ProBNP    Component Value Date/Time   PROBNP 21.0 10/23/2019 1522    Imaging: DG Chest 2 View  Result Date: 10/22/2019 CLINICAL DATA:  Shortness of breath and peripheral edema for 2 weeks. EXAM: CHEST - 2 VIEW COMPARISON:  03/20/2006 FINDINGS: The heart size and mediastinal contours are within normal limits. Both lungs are clear. The visualized skeletal structures are unremarkable. IMPRESSION: No active cardiopulmonary disease. Electronically Signed   By: Marlaine Hind M.D.   On: 10/22/2019 17:17   ECHOCARDIOGRAM COMPLETE  Result Date: 11/05/2019    ECHOCARDIOGRAM REPORT   Patient Name:   Ryan [name]. Date of Exam: 11/05/2019 Medical  Rec #:  [id number]          Height:       69.0 in Accession #:    [id number]         Weight:       238.0 lb Date of Birth:  02/15/1956          BSA:          2.225 m Patient Age:  63 years           BP:           130/74 mmHg Patient Gender: M                  HR:           68 bpm. Exam Location:  [place] Procedure: 2D Echo, Cardiac Doppler, Color Doppler and Intracardiac            Opacification Agent Indications:    R60.0 Lower extremity edema  History:        Patient has prior history of Echocardiogram examinations, most                 recent 10/17/2016. CHF, Signs/Symptoms:Shortness of Breath, Chest                 Pain and EDEMA; Risk Factors:Sleep Apnea, Hypertension,                 Dyslipidemia and Current Smoker.  Sonographer:    Pilar Jarvis RDMS, RVT, RDCS Referring Phys: [place]  1. Left ventricular ejection fraction, by estimation, is 60 to 65%. The left ventricle has normal function. The left ventricle has no regional wall motion abnormalities. Left ventricular diastolic parameters are consistent with Grade II diastolic dysfunction (pseudonormalization).  2. Right ventricular systolic function is normal. The right ventricular size is normal. Tricuspid regurgitation signal is inadequate for assessing PA pressure.  3. Left atrial size was mildly dilated.  4. Mild mitral valve regurgitation. FINDINGS  Left Ventricle: Left ventricular ejection fraction, by estimation, is 60 to 65%. The left ventricle has normal function. The left ventricle has no regional wall motion abnormalities. Definity contrast agent was given IV to delineate the left ventricular  endocardial borders. The left ventricular internal cavity size was normal in size. There is no left ventricular hypertrophy. Left ventricular diastolic parameters are consistent with Grade II diastolic dysfunction (pseudonormalization). Right Ventricle: The right ventricular size is normal. No increase in right ventricular  wall thickness. Right ventricular systolic function is normal. Tricuspid regurgitation signal is inadequate for assessing PA pressure. Left Atrium: Left atrial size was mildly dilated. Right Atrium: Right atrial size was normal in size. Pericardium: There is no evidence of pericardial effusion. Mitral Valve: The mitral valve is normal in structure. Normal mobility of the mitral valve leaflets. Mild mitral valve regurgitation. No evidence of mitral valve stenosis. Tricuspid Valve: The tricuspid valve is normal in structure. Tricuspid valve regurgitation is not demonstrated. No evidence of tricuspid stenosis. Aortic Valve: The aortic valve is grossly normal. Aortic valve regurgitation is not visualized. No aortic stenosis is present. Aortic valve mean gradient measures 3.0 mmHg. Aortic valve peak gradient measures 5.7 mmHg. Aortic valve area, by VTI measures 2.67 cm. Pulmonic Valve: The pulmonic valve was normal in structure. Pulmonic valve regurgitation is not visualized. No evidence of pulmonic stenosis. Aorta: The aortic root is normal in size and structure. Venous: The inferior vena cava is normal in size with greater than 50% respiratory variability, suggesting right atrial pressure of 3 mmHg. IAS/Shunts: No atrial level shunt detected by color flow Doppler.  LEFT VENTRICLE PLAX 2D LVIDd:         4.90 cm  Diastology LVIDs:         3.20 cm  LV e' lateral:   8.38 cm/s LV PW:         0.90 cm  LV E/e' lateral: 9.1 LV IVS:  1.00 cm  LV e' medial:    7.51 cm/s LVOT diam:     2.10 cm  LV E/e' medial:  10.1 LV SV:         65 LV SV Index:   29 LVOT Area:     3.46 cm  RIGHT VENTRICLE             IVC RV Basal diam:  4.50 cm     IVC diam: 2.00 cm RV S prime:     11.90 cm/s TAPSE (M-mode): 2.5 cm LEFT ATRIUM             Index LA diam:        4.50 cm 2.02 cm/m LA Vol (A2C):   56.3 ml 25.31 ml/m LA Vol (A4C):   61.2 ml 27.51 ml/m LA Biplane Vol: 63.2 ml 28.41 ml/m  AORTIC VALVE                   PULMONIC VALVE AV  Area (Vmax):    2.73 cm    PV Vmax:       0.74 m/s AV Area (Vmean):   2.54 cm    PV Peak grad:  2.2 mmHg AV Area (VTI):     2.67 cm AV Vmax:           119.00 cm/s AV Vmean:          84.800 cm/s AV VTI:            0.243 m AV Peak Grad:      5.7 mmHg AV Mean Grad:      3.0 mmHg LVOT Vmax:         93.80 cm/s LVOT Vmean:        62.200 cm/s LVOT VTI:          0.187 m LVOT/AV VTI ratio: 0.77  AORTA Ao Root diam: 2.90 cm Ao Asc diam:  2.90 cm Ao Arch diam: 2.5 cm MITRAL VALVE MV Area (PHT): 3.77 cm    SHUNTS MV Decel Time: 201 msec    Systemic VTI:  0.19 m MV E velocity: 76.20 cm/s  Systemic Diam: 2.10 cm MV A velocity: 66.90 cm/s MV E/A ratio:  1.14 Ida Rogue MD Electronically signed by Ida Rogue MD Signature Date/Time: 11/05/2019/4:43:30 PM    Final     perflutren lipid microspheres (DEFINITY) IV suspension    Date Action Dose Route User   11/05/2019 1618 Given 2 mL Intravenous Pilar Jarvis T      No flowsheet data found.  No results found for: NITRICOXIDE      Assessment & Plan:   OSA on CPAP Severe obstructive sleep apnea.  Patient is doing well on nocturnal CPAP. Continue on current pressure setting.  We will recheck a CPAP in 6 weeks.  Plan  Patient Instructions  Discuss with DME new mask . -Check [email or web address] for mask prices.  Follow up in 6 weeks with Dr. Annamaria Boots  And As needed   Please contact office for sooner follow up if symptoms do not improve or worsen or seek emergency care        Diastolic CHF [facility]) Chronic diastolic dysfunction.  Recent echo showed increased diastolic dysfunction at grade 2.  Patient is encouraged on low-salt diet.  Keep legs elevated.  Continue on Lasix.  Follow-up with cardiology as planned     Rexene Edison, NP 11/06/2019

## 2019-11-06 NOTE — Telephone Encounter (Signed)
-----   Message from Theora Gianotti, NP sent at 11/05/2019  5:07 PM EST ----- Normal heart squeezing function.  The heart remains stiff (also seen in 2018 but slightly more stiff now) so focus on HR/BP remains important.  Heart stiffness likely driving wt gain and swelling.  If he's never been evaluated for sleep apnea, we should refer for sleep study. The mitral valve is mildly leaky - not currently significant.  Follow-up as planned.

## 2019-11-06 NOTE — Patient Instructions (Addendum)
Discuss with DME new mask . -Check [email or web address] for mask prices.  Follow up in 6 weeks with Dr. Annamaria Boots  And As needed   Please contact office for sooner follow up if symptoms do not improve or worsen or seek emergency care

## 2019-11-06 NOTE — Assessment & Plan Note (Signed)
Chronic diastolic dysfunction.  Recent echo showed increased diastolic dysfunction at grade 2.  Patient is encouraged on low-salt diet.  Keep legs elevated.  Continue on Lasix.  Follow-up with cardiology as planned

## 2019-11-06 NOTE — Telephone Encounter (Signed)
Call to patient to discuss results. Pt verbalized understanding and was agreeable to POC.   He will continue with heart healthy measure and f/u as planned.   Pt reports sleep study took place 24 days ago.   Advised pt to call for any further questions or concerns.

## 2019-11-09 ENCOUNTER — Other Ambulatory Visit: Payer: Self-pay | Admitting: Family Medicine

## 2019-11-09 NOTE — Telephone Encounter (Signed)
See Dorisann Frames RN 3/12 telephone encounter for documentation.

## 2019-11-10 ENCOUNTER — Telehealth: Payer: Self-pay

## 2019-11-10 ENCOUNTER — Other Ambulatory Visit: Payer: Self-pay | Admitting: Cardiovascular Disease

## 2019-11-10 MED ORDER — FUROSEMIDE 20 MG PO TABS: 40.0000 mg | ORAL_TABLET | Freq: Every day | ORAL | 0 refills | Status: DC

## 2019-11-10 NOTE — Telephone Encounter (Signed)
*  STAT* If patient is at the pharmacy, call can be transferred to refill team.   1. Which medications need to be refilled? (please list name of each medication and dose if known)  Furosemide 20 MG - 2 tablets daily   2. Which pharmacy/location (including street and city if local pharmacy) is medication to be sent to? [facility]   3. Do they need a 30 day or 90 day supply? 90 day

## 2019-11-10 NOTE — Telephone Encounter (Signed)
Requested Prescriptions   Signed Prescriptions Disp Refills  . furosemide (LASIX) 20 MG tablet 180 tablet 0    Sig: Take 2 tablets (40 mg total) by mouth daily.    Authorizing Provider: Theora Gianotti    Ordering User: Raelene Bott, Nanci Lakatos L

## 2019-11-10 NOTE — Telephone Encounter (Signed)
Patient's wife is checking on Rx for furosemide (LASIX) 20 MG tablet. Pharmacy has not filled it yet.

## 2019-11-10 NOTE — Telephone Encounter (Signed)
Spoke to patient and he reports he is seeing cardiologist Dr Harrell Gave End in [place] and Dr. Saunders Revel increased his furosemide to 40 mg but did not give him a new prescription. Patient reports he just started taking 2 of the 20 milligrams prescribed to him by Dr Raoul Pitch.  Advised patient to call Dr End for a prescription of the 40 mg to last until he sees provider again on 11-19-19.  Also advised patient to call our office for follow up With [facility] after he sees cardiology.

## 2019-11-10 NOTE — Telephone Encounter (Signed)
Patient's wife called our office to check on status for furosemide (LASIX) 20 MG tablet. Pharmacy has not filled it yet.

## 2019-11-12 NOTE — Telephone Encounter (Signed)
rx filled 11-10-19 by cardiologist

## 2019-11-18 NOTE — Progress Notes (Addendum)
Cardiology Office Note  Date: 11/19/2019   ID: Ryan [name]., DOB June 27, 1956, MRN [id number]  PCP:  Ryan Alar, NP  Cardiologist:  Ryan Bush, MD Electrophysiologist:  None   Chief Complaint: CAD, HTN, HLD, LE edema, OSA on CPAP  History of Present Illness: Ryan [name] a 64 y.o. male with a history of nonobstructive CAD, CP atypical, HTN, HLD, GERD, LE edema, hepatic steatosis.  Seen by Ryan Sea, NP on 10/29/2019 in follow-up for lower extremity edema.  He increased patient's Lasix to 40 mg daily.  An echocardiogram was  ordered. Echocardiogram on 11/05/2019 showed LVEF 60 to 65%.  No RWMA's, grade 2 DD, mild MR.  Patient was referred to pulmonary for evaluation for sleep apnea.  He had a home sleep study and was found to have severe obstructive sleep apnea and started on nocturnal CPAP.  He had a follow-up on 11/06/2019 for OSA and was doing well on CPAP.  He was sleeping about 7 hours per night.  CPAP pressure was adjusted up to 20 cm of water.  Patient states he [name] doing well but his blood pressure remains a little elevated and he states there has been no change in the lower extremity edema from last visit since increasing dose of Lasix to 40 mg daily.  He denies any other changes since last visit.  He states it may be coincidental but he does not noticed the swelling in his legs until he started CPAP therapy.   Past Medical History:  Diagnosis Date  . Abnormal liver function    history of   . Atypical chest pain   . Blind left eye    blind left eye since birth  . Cancer ([place])    skin:  basal cell carcinoma/ on face,back and neck  . Cataract    Bil  . Diastolic dysfunction    Q000111Q Echo: EF 55-60%, Gr1 DD, triv MR.  . Fatty liver    fatty liver disease  . GERD (gastroesophageal reflux disease)   . History of indigestion   . Hyperlipidemia   . Hypertension   . Non-obstructive CAD (coronary artery disease)    a. 2016 Neg MV; b. 10/2017  Cath: LM nl, LAD 56m, D1/2/3 nl, LCX 20p, RCA nl, EF 50-55%.  . SOB (shortness of breath) 2019   after climbing stairs    Past Surgical History:  Procedure Laterality Date  . COLONOSCOPY    . LEFT HEART CATH AND CORONARY ANGIOGRAPHY Left 10/29/2017   Procedure: LEFT HEART CATH AND CORONARY ANGIOGRAPHY;  Surgeon: Ryan Bush, MD;  Location: [facility];  Service: Cardiovascular;  Laterality: Left;  . TONSILLECTOMY  1965   as a child    Current Outpatient Medications  Medication Sig Dispense Refill  . amLODipine (NORVASC) 5 MG tablet Take 1 tablet (5 mg total) by mouth daily. 90 tablet 1  . aspirin EC 81 MG tablet Take 1 tablet (81 mg total) by mouth daily. 90 tablet 3  . furosemide (LASIX) 20 MG tablet Take 2 tablets (40 mg total) by mouth daily. 180 tablet 0  . Omega-3 Fatty Acids (FISH OIL) 1200 MG CAPS Take 4,800 mg by mouth daily.     . psyllium (METAMUCIL) 58.6 % powder Take 1 packet by mouth as needed.    . rosuvastatin (CRESTOR) 20 MG tablet Take 1 tablet (20 mg total) by mouth daily. 90 tablet 1  . losartan (COZAAR) 25 MG tablet Take 1 tablet (  25 mg total) by mouth daily. 90 tablet 3   No current facility-administered medications for this visit.   Allergies:  Atorvastatin and Tape   Social History: The patient  reports that he has been smoking cigars. He has never used smokeless tobacco. He reports current alcohol use of about 2.0 standard drinks of alcohol per week. He reports that he does not use drugs.   Family History: The patient's family history includes Colon cancer in his mother; Diabetes in his mother and sister; Heart attack in his father; Heart disease in his father; Hypertension in his mother; Lung cancer in his father.   ROS:  Please see the history of present illness. Otherwise, complete review of systems [name] positive for none.  All other systems are reviewed and negative.    Physical Exam: VS:  BP 140/82 (BP Location: Left Arm, Patient Position:  Sitting, Cuff Size: Large)   Pulse 63   Ht 5\' 9"  (1.753 m)   Wt 237 lb 6 oz (107.7 kg)   SpO2 97%   BMI 35.05 kg/m , BMI Body mass index [name] 35.05 kg/m.  Wt Readings from Last 3 Encounters:  11/19/19 237 lb 6 oz (107.7 kg)  11/06/19 239 lb 3.2 oz (108.5 kg)  10/29/19 238 lb (108 kg)    General: Patient appears comfortable at rest. Neck: Supple, no elevated JVP or carotid bruits, no thyromegaly. Lungs: Clear to auscultation, nonlabored breathing at rest. Cardiac: Regular rate and rhythm, no S3 or significant systolic murmur, no pericardial rub. Extremities: 1+ bilateral pitting edema, distal pulses 2+. Skin: Warm and dry. Neuropsychiatric: Alert and oriented x3, affect grossly appropriate.  ECG:  An ECG dated 11/19/2019 was personally reviewed today and demonstrated:  Normal sinus rhythm rate of 63.  No acute ST or T wave abnormalities, normal axis, no hypertrophy noted  Recent Labwork: 08/26/2019: ALT 28; AST 25; Hemoglobin 15.2; Platelets 226.0 09/18/2019: TSH 1.64 10/23/2019: Pro B Natriuretic peptide (BNP) 21.0 10/29/2019: BUN 16; Creatinine, Ser 1.19; Potassium 4.5; Sodium 140     Component Value Date/Time   CHOL 143 08/26/2019 0854   TRIG 64.0 08/26/2019 0854   HDL 58.10 08/26/2019 0854   CHOLHDL 2 08/26/2019 0854   VLDL 12.8 08/26/2019 0854   LDLCALC 72 08/26/2019 0854   LDLCALC 69 02/07/2018 1658   LDLDIRECT 144.3 11/17/2007 1623    Other Studies Reviewed Today:  Echocardiogram 11/05/2019 1. Left ventricular ejection fraction, by estimation, [name] 60 to 65%. The left ventricle has normal function. The left ventricle has no regional wall motion abnormalities. Left ventricular diastolic parameters are consistent with Grade II diastolic dysfunction (pseudonormalization). 2. Right ventricular systolic function [name] normal. The right ventricular size [name] normal. Tricuspid regurgitation signal [name] inadequate for assessing PA pressure. 3. Left atrial size was mildly dilated. 4. Mild  mitral valve regurgitation  EXAM: CHEST - 2 VIEW 10/22/2019  COMPARISON:  03/20/2006 FINDINGS: The heart size and mediastinal contours are within normal limits. Both lungs are clear. The visualized skeletal structures are unremarkable. IMPRESSION: No active cardiopulmonary disease.  Left heart cath 10/29/2017 Conclusions: 1. Mild to moderate coronary artery disease, with long 50% stenosis of the mid/distal LAD and 20% ostial LCx disease. 2. Low normal left ventricular contraction (LVEF 50-55%). 3. Upper normal left ventricular filling pressure (LVEDP 15 mmHg). Diagnostic Dominance: Right  Intervention    Assessment and Plan:  1. Coronary artery disease involving native coronary artery of native heart without angina pectoris   2. Lower extremity edema   3.  Obstructive sleep apnea on CPAP   4. Essential hypertension   5. Hyperlipidemia LDL goal <70    1. Coronary artery disease involving native coronary artery of native heart without angina pectoris Cardiac catheterization March 2019 showed: Mild to moderate coronary artery disease, with long 50% stenosis of the mid/distal LAD and 20% ostial LCx disease.  Patient denies any progressive anginal or exertional symptoms.  Continue aspirin 81 mg  2. Lower extremity edema Continues with bilateral 1+ pitting edema from ankles to below the knee in spite of increasing dose of Lasix to 40 mg daily.  Have the patient take Lasix 40 mg p.o. twice daily x3 days then revert back to 40 mg/day.  3. Obstructive sleep apnea on CPAP Patient recently had a positive sleep study for severe obstructive sleep apnea now on CPAP.  Recently had his pressures adjusted.  He [name] tolerating CPAP well.  He states he thinks the pressures may be too high and may need to have them adjusted.  He has been tolerating the CPAP well.  4. Essential hypertension Blood pressure [name] elevated today at 140/82.  Advised patient to continue to check his blood pressures at  home and bring a log of blood pressures with him to next visit.  I believe the amlodipine may be contributing to his lower extremity edema.  We will stop the amlodipine and start him on losartan 25 mg daily.  We will get a BMP in 1 to 2 weeks.  5. Hyperlipidemia LDL goal <70 Lipid profile on 08/26/2019: Cholesterol 143, triglycerides 64, HDL 58, LDL 72.  Continue Crestor 20 mg daily  Medication Adjustments/Labs and Tests Ordered: Current medicines are reviewed at length with the patient today.  Concerns regarding medicines are outlined above.   Disposition: Follow-up with Dr. Saunders Revel or APP 1 month  Signed, Levell July, NP 11/19/2019 4:01 PM    [place]

## 2019-11-19 ENCOUNTER — Ambulatory Visit (INDEPENDENT_AMBULATORY_CARE_PROVIDER_SITE_OTHER): Payer: 59 | Admitting: Family Medicine

## 2019-11-19 ENCOUNTER — Encounter: Payer: Self-pay | Admitting: Physician Assistant

## 2019-11-19 ENCOUNTER — Other Ambulatory Visit: Payer: Self-pay

## 2019-11-19 VITALS — BP 140/82 | HR 63 | Ht 69.0 in | Wt 237.4 lb

## 2019-11-19 DIAGNOSIS — E785 Hyperlipidemia, unspecified: Secondary | ICD-10-CM

## 2019-11-19 DIAGNOSIS — I10 Essential (primary) hypertension: Secondary | ICD-10-CM | POA: Diagnosis not present

## 2019-11-19 DIAGNOSIS — G4733 Obstructive sleep apnea (adult) (pediatric): Secondary | ICD-10-CM

## 2019-11-19 DIAGNOSIS — I2510 Atherosclerotic heart disease of native coronary artery without angina pectoris: Secondary | ICD-10-CM | POA: Diagnosis not present

## 2019-11-19 DIAGNOSIS — R600 Localized edema: Secondary | ICD-10-CM | POA: Diagnosis not present

## 2019-11-19 DIAGNOSIS — Z9989 Dependence on other enabling machines and devices: Secondary | ICD-10-CM

## 2019-11-19 MED ORDER — LOSARTAN POTASSIUM 25 MG PO TABS: 25.0000 mg | ORAL_TABLET | Freq: Every day | ORAL | 3 refills | Status: DC

## 2019-11-19 NOTE — Addendum Note (Signed)
Addended by: Verlon Au on: 11/19/2019 04:11 PM   Modules accepted: Orders

## 2019-11-19 NOTE — Patient Instructions (Signed)
Medication Instructions:  1- STOP Amlodipine 2- START Losartan  *If you need a refill on your cardiac medications before your next appointment, please call your pharmacy*   Lab Work: 1- Your physician recommends that you return for lab work in: 1 weeks at (your preferred location) If you have labs (blood work) drawn today and your tests are completely normal, you will receive your results only by: Marland Kitchen MyChart Message (if you have MyChart) OR . A paper copy in the mail If you have any lab test that is abnormal or we need to change your treatment, we will call you to review the results.   Testing/Procedures: None ordered    Follow-Up: At [facility], you and your health needs are our priority.  As part of our continuing mission to provide you with exceptional heart care, we have created designated Provider Care Teams.  These Care Teams include your primary Cardiologist (physician) and Advanced Practice Providers (APPs -  Physician Assistants and Nurse Practitioners) who all work together to provide you with the care you need, when you need it.  We recommend signing up for the patient portal called "MyChart".  Sign up information is provided on this After Visit Summary.  MyChart is used to connect with patients for Virtual Visits (Telemedicine).  Patients are able to view lab/test results, encounter notes, upcoming appointments, etc.  Non-urgent messages can be sent to your provider as well.   To learn more about what you can do with MyChart, go to [email or web address].    Your next appointment:   1 month(s)  The format for your next appointment:   In Person  Provider:    You may see Nelva Bush, MD or one of the following Advanced Practice Providers on your designated Care Team:    Murray Hodgkins, NP  Christell Faith, PA-C  Marrianne Mood, PA-C

## 2019-11-20 NOTE — Telephone Encounter (Signed)
Called patient to follow up but had to leave detailed message. Gave him the recs to consult his cardiologist about concern. Will close this out. Nothing further needed at this time.

## 2019-12-02 ENCOUNTER — Encounter: Payer: Self-pay | Admitting: Internal Medicine

## 2019-12-03 ENCOUNTER — Encounter: Payer: Self-pay | Admitting: Internal Medicine

## 2019-12-03 ENCOUNTER — Ambulatory Visit (INDEPENDENT_AMBULATORY_CARE_PROVIDER_SITE_OTHER): Payer: 59 | Admitting: Internal Medicine

## 2019-12-03 ENCOUNTER — Other Ambulatory Visit: Payer: Self-pay

## 2019-12-03 VITALS — BP 120/74 | HR 60 | Temp 98.3°F | Ht 69.0 in | Wt 232.0 lb

## 2019-12-03 DIAGNOSIS — F17200 Nicotine dependence, unspecified, uncomplicated: Secondary | ICD-10-CM | POA: Diagnosis not present

## 2019-12-03 DIAGNOSIS — Z9989 Dependence on other enabling machines and devices: Secondary | ICD-10-CM | POA: Diagnosis not present

## 2019-12-03 DIAGNOSIS — M7989 Other specified soft tissue disorders: Secondary | ICD-10-CM

## 2019-12-03 DIAGNOSIS — G4733 Obstructive sleep apnea (adult) (pediatric): Secondary | ICD-10-CM | POA: Diagnosis not present

## 2019-12-03 LAB — BASIC METABOLIC PANEL
BUN/Creatinine Ratio: 12 (ref 10–24)
BUN: 15 mg/dL (ref 8–27)
CO2: 26 mmol/L (ref 20–29)
Calcium: 9.8 mg/dL (ref 8.6–10.2)
Chloride: 100 mmol/L (ref 96–106)
Creatinine, Ser: 1.22 mg/dL (ref 0.76–1.27)
GFR calc Af Amer: 74 mL/min/{1.73_m2} (ref >59)
GFR calc non Af Amer: 64 mL/min/{1.73_m2} (ref >59)
Glucose: 88 mg/dL (ref 65–99)
Potassium: 4.3 mmol/L (ref 3.5–5.2)
Sodium: 139 mmol/L (ref 134–144)
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
## 2019-12-03 NOTE — Patient Instructions (Signed)
Order- schedule CPAP mask fitting at sleep center  We can continue CPAP auto 10-20  Please call if we can help

## 2019-12-04 ENCOUNTER — Telehealth: Payer: Self-pay | Admitting: *Deleted

## 2019-12-04 NOTE — Telephone Encounter (Signed)
Patient informed. Copy sent to PCP °

## 2019-12-04 NOTE — Telephone Encounter (Signed)
-----   Message from Verta Ellen., NP sent at 12/03/2019  8:29 AM EDT ----- Please call the patient and tell him his basic metabolic panel look perfect.  No abnormalities.  Thank you

## 2019-12-22 ENCOUNTER — Encounter: Payer: Self-pay | Admitting: Internal Medicine

## 2019-12-22 NOTE — Assessment & Plan Note (Signed)
Peripheral edema is not commonly a result of CPAP use in my experience.  More likely relate to his hx dCHF Plan- elevate legs when sitting, discuss with other providers

## 2019-12-22 NOTE — Assessment & Plan Note (Signed)
Benefits, but needs mask re-fitted Plan - continue auto 10-20, mask fitting

## 2019-12-22 NOTE — Assessment & Plan Note (Signed)
Occasional cigar Plan- encourage abstinence

## 2019-12-31 ENCOUNTER — Encounter: Payer: Self-pay | Admitting: Internal Medicine

## 2019-12-31 ENCOUNTER — Other Ambulatory Visit: Payer: Self-pay

## 2019-12-31 ENCOUNTER — Ambulatory Visit (INDEPENDENT_AMBULATORY_CARE_PROVIDER_SITE_OTHER): Payer: 59 | Admitting: Internal Medicine

## 2019-12-31 VITALS — BP 110/80 | HR 69 | Ht 69.0 in | Wt 233.1 lb

## 2019-12-31 DIAGNOSIS — G4733 Obstructive sleep apnea (adult) (pediatric): Secondary | ICD-10-CM

## 2019-12-31 DIAGNOSIS — I2510 Atherosclerotic heart disease of native coronary artery without angina pectoris: Secondary | ICD-10-CM | POA: Diagnosis not present

## 2019-12-31 DIAGNOSIS — I10 Essential (primary) hypertension: Secondary | ICD-10-CM | POA: Diagnosis not present

## 2019-12-31 DIAGNOSIS — I5032 Chronic diastolic (congestive) heart failure: Secondary | ICD-10-CM

## 2019-12-31 DIAGNOSIS — Z9989 Dependence on other enabling machines and devices: Secondary | ICD-10-CM

## 2019-12-31 DIAGNOSIS — E785 Hyperlipidemia, unspecified: Secondary | ICD-10-CM | POA: Diagnosis not present

## 2019-12-31 MED ORDER — FUROSEMIDE 20 MG PO TABS: 20.0000 mg | ORAL_TABLET | Freq: Every day | ORAL | 3 refills | Status: DC | PRN

## 2019-12-31 NOTE — Patient Instructions (Signed)
Medication Instructions:  Your physician recommends that you continue on your current medications as directed. Please refer to the Current Medication list given to you today.  Prescription for furosemide 20 mg by mouth once a day as needed send to your pharmacy.  *If you need a refill on your cardiac medications before your next appointment, please call your pharmacy*  Follow-Up: At [facility], you and your health needs are our priority.  As part of our continuing mission to provide you with exceptional heart care, we have created designated Provider Care Teams.  These Care Teams include your primary Cardiologist (physician) and Advanced Practice Providers (APPs -  Physician Assistants and Nurse Practitioners) who all work together to provide you with the care you need, when you need it.  We recommend signing up for the patient portal called "MyChart".  Sign up information is provided on this After Visit Summary.  MyChart is used to connect with patients for Virtual Visits (Telemedicine).  Patients are able to view lab/test results, encounter notes, upcoming appointments, etc.  Non-urgent messages can be sent to your provider as well.   To learn more about what you can do with MyChart, go to [email or web address].    Your next appointment:   12 month(s)  The format for your next appointment:   In Person  Provider:    You may see Nelva Bush, MD or one of the following Advanced Practice Providers on your designated Care Team:    Murray Hodgkins, NP  Christell Faith, PA-C  Marrianne Mood, PA-C

## 2019-12-31 NOTE — Progress Notes (Signed)
Follow-up Outpatient Visit Date: 12/31/2019  Primary Care Provider: Debbrah Alar, NP [address] [place] [address]  Chief Complaint: Follow-up hypertension and HFpEF  HPI:  Mr. Cuthbert is a 64 y.o. male with history of nonobstructive coronary artery disease, chronic HFpEF, hypertension, hyperlipidemia, obstructive sleep apnea, GERD, and hepatic steatosis, who presents for follow-up of hypertension and HFpEF.  He was last seen in our office in late March by Levell July, NP, at which time he was doing well though blood pressure remains somewhat elevated at times.  He had just started using CPAP after having been diagnosed with severe obstructive sleep apnea.  Today, Mr. Mammone reports that he is feeling relatively well.  He is tolerating CPAP well and overall feels better since having started this.  He has experienced leg edema with initiation of CPAP and believes the 2 are related.  He completed furosemide and feels like his swelling is better but not completely gone.  He denies chest pain, shortness of breath, palpitations, lightheadedness, and orthopnea.  He is planning to retire in the next 2 months.  --------------------------------------------------------------------------------------------------  Past Medical History:  Diagnosis Date  . Abnormal liver function    history of   . Atypical chest pain   . Blind left eye    blind left eye since birth  . Cancer ([place])    skin:  basal cell carcinoma/ on face,back and neck  . Cataract    Bil  . Diastolic dysfunction    Q000111Q Echo: EF 55-60%, Gr1 DD, triv MR.  . Fatty liver    fatty liver disease  . GERD (gastroesophageal reflux disease)   . History of indigestion   . Hyperlipidemia   . Hypertension   . Non-obstructive CAD (coronary artery disease)    a. 2016 Neg MV; b. 10/2017 Cath: LM nl, LAD 68m, D1/2/3 nl, LCX 20p, RCA nl, EF 50-55%.  . SOB (shortness of breath) 2019   after climbing stairs    Past Surgical History:  Procedure Laterality Date  . CARDIAC CATHETERIZATION    . COLONOSCOPY    . LEFT HEART CATH AND CORONARY ANGIOGRAPHY Left 10/29/2017   Procedure: LEFT HEART CATH AND CORONARY ANGIOGRAPHY;  Surgeon: Nelva Bush, MD;  Location: [facility];  Service: Cardiovascular;  Laterality: Left;  . TONSILLECTOMY  1965   as a child    Current Meds  Medication Sig  . aspirin EC 81 MG tablet Take 1 tablet (81 mg total) by mouth daily.  Marland Kitchen losartan (COZAAR) 25 MG tablet Take 1 tablet (25 mg total) by mouth daily.  . Omega-3 Fatty Acids (FISH OIL) 1200 MG CAPS Take 4,800 mg by mouth daily.   . psyllium (METAMUCIL) 58.6 % powder Take 1 packet by mouth as needed.  . rosuvastatin (CRESTOR) 20 MG tablet Take 1 tablet (20 mg total) by mouth daily.    Allergies: Atorvastatin and Tape  Social History   Tobacco Use  . Smoking status: Light Tobacco Smoker    Types: Cigars  . Smokeless tobacco: Never Used  . Tobacco comment: occasional cigar  Substance Use Topics  . Alcohol use: Yes    Alcohol/week: 2.0 standard drinks    Types: 1 Cans of beer, 1 Standard drinks or equivalent per week    Comment: occasional drink  . Drug use: No    Family History  Problem Relation Age of Onset  . Colon cancer Mother        deceased age 61  .  Diabetes Mother   . Hypertension Mother   . Lung cancer Father        deceased age 65  . Heart disease Father        Bypass in mid-late 71's  . Heart attack Father        41  . Diabetes Sister   . Colon polyps Neg Hx   . Esophageal cancer Neg Hx   . Stomach cancer Neg Hx   . Rectal cancer Neg Hx     Review of Systems: A 12-system review of systems was performed and was negative except as noted in the HPI.  --------------------------------------------------------------------------------------------------  Physical Exam: BP 110/80 (BP Location: Left Arm, Patient Position: Sitting, Cuff Size: Large)   Pulse 69   Ht 5\' 9"   (1.753 m)   Wt 233 lb 2 oz (105.7 kg)   SpO2 95%   BMI 34.43 kg/m   General: NAD. HEENT: No conjunctival pallor or scleral icterus. Facemask in place. Neck: No JVD or HJR. Lungs: Normal work of breathing. Clear to auscultation bilaterally without wheezes or crackles. Heart: Regular rate and rhythm without murmurs, rubs, or gallops. Abd: Bowel sounds present. Soft, NT/ND. Ext: 1+ distal calf edema bilaterally.  EKG: Normal sinus rhythm without abnormality.  Lab Results  Component Value Date   WBC 10.9 (H) 08/26/2019   HGB 15.2 08/26/2019   HCT 44.1 08/26/2019   MCV 87.7 08/26/2019   PLT 226.0 08/26/2019    Lab Results  Component Value Date   NA 139 12/02/2019   K 4.3 12/02/2019   CL 100 12/02/2019   CO2 26 12/02/2019   BUN 15 12/02/2019   CREATININE 1.22 12/02/2019   GLUCOSE 88 12/02/2019   ALT 28 08/26/2019    Lab Results  Component Value Date   CHOL 143 08/26/2019   HDL 58.10 08/26/2019   LDLCALC 72 08/26/2019   LDLDIRECT 144.3 11/17/2007   TRIG 64.0 08/26/2019   CHOLHDL 2 08/26/2019    --------------------------------------------------------------------------------------------------  ASSESSMENT AND PLAN: Hypertension: Blood pressure well controlled today.  Continue current regimen of losartan 25 mg daily.  Chronic HFpEF: Edema has improved per Mr. Seedorf report, though he still has some distal calf edema on exam today.  I have suggested that he use furosemide 20 mg daily as needed for weight gain/swelling.  New prescription has been provided today.  Nonobstructive coronary artery disease: Mr. Boike denies chest pain or shortness of breath to suggest worsening coronary insufficiency.  We will continue with low-dose aspirin and statin therapy to prevent progression of disease.  Hyperlipidemia: LDL just above goal on most recent check in 07/2019 (72).  We will continue with rosuvastatin 20 mg daily.  Obstructive sleep apnea: I encouraged Mr. Cordy  to continue using CPAP, as it has already helped him feel better and improve his blood pressure.  Follow-up: Return to clinic in 1 year.  Nelva Bush, MD 01/01/2020 7:22 AM

## 2020-01-01 ENCOUNTER — Encounter: Payer: Self-pay | Admitting: Internal Medicine

## 2020-01-01 DIAGNOSIS — I5032 Chronic diastolic (congestive) heart failure: Secondary | ICD-10-CM | POA: Insufficient documentation

## 2020-01-01 DIAGNOSIS — I2510 Atherosclerotic heart disease of native coronary artery without angina pectoris: Secondary | ICD-10-CM | POA: Insufficient documentation

## 2020-02-10 ENCOUNTER — Ambulatory Visit (INDEPENDENT_AMBULATORY_CARE_PROVIDER_SITE_OTHER): Payer: 59 | Admitting: Family

## 2020-02-10 ENCOUNTER — Other Ambulatory Visit: Payer: Self-pay

## 2020-02-10 VITALS — BP 120/68 | HR 66 | Temp 97.8°F | Resp 18 | Ht 69.0 in | Wt 231.4 lb

## 2020-02-10 DIAGNOSIS — M5416 Radiculopathy, lumbar region: Secondary | ICD-10-CM | POA: Diagnosis not present

## 2020-02-10 MED ORDER — MELOXICAM 7.5 MG PO TABS: 7.5000 mg | ORAL_TABLET | Freq: Every day | ORAL | 0 refills | Status: DC

## 2020-02-10 NOTE — Patient Instructions (Signed)
Please begin meloxicam once daily for nerve pain. Call if symptoms worsen or if symptoms are not improved in 2 weeks.   Radicular Pain Radicular pain is a type of pain that spreads from your back or neck along a spinal nerve. Spinal nerves are nerves that leave the spinal cord and go to the muscles. Radicular pain is sometimes called radiculopathy, radiculitis, or a pinched nerve. When you have this type of pain, you may also have weakness, numbness, or tingling in the area of your body that is supplied by the nerve. The pain may feel sharp and burning. Depending on which spinal nerve is affected, the pain may occur in the:  Neck area (cervical radicular pain). You may also feel pain, numbness, weakness, or tingling in the arms.  Mid-spine area (thoracic radicular pain). You would feel this pain in the back and chest. This type is rare.  Lower back area (lumbar radicular pain). You would feel this pain as low back pain. You may feel pain, numbness, weakness, or tingling in the buttocks or legs. Sciatica is a type of lumbar radicular pain that shoots down the back of the leg. Radicular pain occurs when one of the spinal nerves becomes irritated or squeezed (compressed). It is often caused by something pushing on a spinal nerve, such as one of the bones of the spine (vertebrae) or one of the round cushions between vertebrae (intervertebral disks). This can result from:  An injury.  Wear and tear or aging of a disk.  The growth of a bone spur that pushes on the nerve. Radicular pain often goes away when you follow instructions from your health care provider for relieving pain at home. Follow these instructions at home: Managing pain      If directed, put ice on the affected area: ? Put ice in a plastic bag. ? Place a towel between your skin and the bag. ? Leave the ice on for 20 minutes, 2-3 times a day.  If directed, apply heat to the affected area as often as told by your health care  provider. Use the heat source that your health care provider recommends, such as a moist heat pack or a heating pad. ? Place a towel between your skin and the heat source. ? Leave the heat on for 20-30 minutes. ? Remove the heat if your skin turns bright red. This is especially important if you are unable to feel pain, heat, or cold. You may have a greater risk of getting burned. Activity   Do not sit or rest in bed for long periods of time.  Try to stay as active as possible. Ask your health care provider what type of exercise or activity is best for you.  Avoid activities that make your pain worse, such as bending and lifting.  Do not lift anything that is heavier than 10 lb (4.5 kg), or the limit that you are told, until your health care provider says that it is safe.  Practice using proper technique when lifting items. Proper lifting technique involves bending your knees and rising up.  Do strength and range-of-motion exercises only as told by your health care provider or physical therapist. General instructions  Take over-the-counter and prescription medicines only as told by your health care provider.  Pay attention to any changes in your symptoms.  Keep all follow-up visits as told by your health care provider. This is important. ? Your health care provider may send you to a physical therapist to help  with this pain. Contact a health care provider if:  Your pain and other symptoms get worse.  Your pain medicine is not helping.  Your pain has not improved after a few weeks of home care.  You have a fever. Get help right away if:  You have severe pain, weakness, or numbness.  You have difficulty with bladder or bowel control. Summary  Radicular pain is a type of pain that spreads from your back or neck along a spinal nerve.  When you have radicular pain, you may also have weakness, numbness, or tingling in the area of your body that is supplied by the nerve.  The  pain may feel sharp or burning.  Radicular pain may be treated with ice, heat, medicines, or physical therapy. This information is not intended to replace advice given to you by your health care provider. Make sure you discuss any questions you have with your health care provider. Document Revised: 02/25/2018 Document Reviewed: 02/25/2018 Elsevier Patient Education  [place].

## 2020-02-10 NOTE — Progress Notes (Signed)
Subjective:    Patient ID: Ryan [name]., male    DOB: 1956-06-24, 64 y.o.   MRN: [id number]  HPI  Patient is a 64 yr old male who presents today with chief complaint on right upper thigh pain.  Pain started in April and has been intermittent. 2 nights ago he had worsening pain.  Feels like "somethings crawling on my skin." denies LE weakness.  No issues with walking. Denies any unusual low back pain. Has not tried any otc medications.   Review of Systems See HPI  Past Medical History:  Diagnosis Date  . Abnormal liver function    history of   . Atypical chest pain   . Blind left eye    blind left eye since birth  . Cancer ([facility])    skin:  basal cell carcinoma/ on face,back and neck  . Cataract    Bil  . Diastolic dysfunction    H.01/7590 Echo: EF 55-60%, Gr1 DD, triv MR.  . Fatty liver    fatty liver disease  . GERD (gastroesophageal reflux disease)   . History of indigestion   . Hyperlipidemia   . Hypertension   . Non-obstructive CAD (coronary artery disease)    a. 2016 Neg MV; b. 10/2017 Cath: LM nl, LAD 30m, D1/2/3 nl, LCX 20p, RCA nl, EF 50-55%.  . SOB (shortness of breath) 2019   after climbing stairs     Social History   Socioeconomic History  . Marital status: Married    Spouse name: Not on file  . Number of children: 1  . Years of education: Not on file  . Highest education level: Not on file  Occupational History  . Occupation: Designer, industrial/product: [employer]  Tobacco Use  . Smoking status: Light Tobacco Smoker    Types: Cigars  . Smokeless tobacco: Never Used  . Tobacco comment: occasional cigar  Vaping Use  . Vaping Use: Never used  Substance and Sexual Activity  . Alcohol use: Yes    Alcohol/week: 2.0 standard drinks    Types: 1 Cans of beer, 1 Standard drinks or equivalent per week    Comment: occasional drink  . Drug use: No  . Sexual activity: Not on file  Other Topics Concern  . Not on file  Social History Narrative        Social Determinants of Health   Financial Resource Strain:   . Difficulty of Paying Living Expenses:   Food Insecurity:   . Worried About Charity fundraiser in the Last Year:   . Arboriculturist in the Last Year:   Transportation Needs:   . Film/video editor (Medical):   Marland Kitchen Lack of Transportation (Non-Medical):   Physical Activity:   . Days of Exercise per Week:   . Minutes of Exercise per Session:   Stress:   . Feeling of Stress :   Social Connections:   . Frequency of Communication with Friends and Family:   . Frequency of Social Gatherings with Friends and Family:   . Attends Religious Services:   . Active Member of Clubs or Organizations:   . Attends Archivist Meetings:   Marland Kitchen Marital Status:   Intimate Partner Violence:   . Fear of Current or Ex-Partner:   . Emotionally Abused:   Marland Kitchen Physically Abused:   . Sexually Abused:     Past Surgical History:  Procedure Laterality Date  . CARDIAC CATHETERIZATION    .  COLONOSCOPY    . LEFT HEART CATH AND CORONARY ANGIOGRAPHY Left 10/29/2017   Procedure: LEFT HEART CATH AND CORONARY ANGIOGRAPHY;  Surgeon: Nelva Bush, MD;  Location: [facility];  Service: Cardiovascular;  Laterality: Left;  . TONSILLECTOMY  1965   as a child    Family History  Problem Relation Age of Onset  . Colon cancer Mother        deceased age 83  . Diabetes Mother   . Hypertension Mother   . Lung cancer Father        deceased age 53  . Heart disease Father        Bypass in mid-late 1's  . Heart attack Father        54  . Diabetes Sister   . Colon polyps Neg Hx   . Esophageal cancer Neg Hx   . Stomach cancer Neg Hx   . Rectal cancer Neg Hx     Allergies  Allergen Reactions  . Atorvastatin Other (See Comments)    Hands swelled  . Tape     Caused redness    Current Outpatient Medications on File Prior to Visit  Medication Sig Dispense Refill  . aspirin EC 81 MG tablet Take 1 tablet (81 mg total) by mouth  daily. 90 tablet 3  . losartan (COZAAR) 25 MG tablet Take 1 tablet (25 mg total) by mouth daily. 90 tablet 3  . Omega-3 Fatty Acids (FISH OIL) 1200 MG CAPS Take 4,800 mg by mouth daily.     . psyllium (METAMUCIL) 58.6 % powder Take 1 packet by mouth as needed.    . furosemide (LASIX) 20 MG tablet Take 1 tablet (20 mg total) by mouth daily as needed. (Patient not taking: Reported on 02/10/2020) 90 tablet 3  . rosuvastatin (CRESTOR) 20 MG tablet Take 1 tablet (20 mg total) by mouth daily. 90 tablet 1   No current facility-administered medications on file prior to visit.    BP 120/68 (BP Location: Right Arm, Patient Position: Sitting, Cuff Size: Large)   Pulse 66   Temp 97.8 F (36.6 C) (Temporal)   Resp 18   Ht 5\' 9"  (1.753 m)   Wt 231 lb 6.4 oz (105 kg)   SpO2 96%   BMI 34.17 kg/m       Objective:   Physical Exam Constitutional:      General: He is not in acute distress.    Appearance: He is well-developed.  HENT:     Head: Normocephalic and atraumatic.  Cardiovascular:     Rate and Rhythm: Normal rate and regular rhythm.     Heart sounds: No murmur heard.   Pulmonary:     Effort: Pulmonary effort is normal. No respiratory distress.     Breath sounds: Normal breath sounds. No wheezing or rales.  Musculoskeletal:     Comments: No spinal tenderness to palpation.  Skin:    General: Skin is warm and dry.  Neurological:     Mental Status: He is alert and oriented to person, place, and time.     Comments: Bilateral lower extreme extremity strength is 5 out of 5, 2+ bilateral patellar reflexes  Psychiatric:        Behavior: Behavior normal.        Thought Content: Thought content normal.           Assessment & Plan:  Lumbar radiculopathy-symptoms most consistent with lumbar radiculopathy.  Advised patient on trial of meloxicam.  He is advised  to call if symptoms worsen or if they are not improved in 2 weeks.  Patient verbalizes understanding.  This visit occurred  during the SARS-CoV-2 public health emergency.  Safety protocols were in place, including screening questions prior to the visit, additional usage of staff PPE, and extensive cleaning of exam room while observing appropriate contact time as indicated for disinfecting solutions.

## 2020-02-11 ENCOUNTER — Encounter: Payer: Self-pay | Admitting: Family

## 2020-02-27 ENCOUNTER — Other Ambulatory Visit: Payer: Self-pay | Admitting: Family

## 2020-05-20 ENCOUNTER — Other Ambulatory Visit: Payer: Self-pay | Admitting: Family

## 2020-06-22 ENCOUNTER — Other Ambulatory Visit: Payer: Self-pay | Admitting: Internal Medicine

## 2020-06-29 ENCOUNTER — Ambulatory Visit (INDEPENDENT_AMBULATORY_CARE_PROVIDER_SITE_OTHER): Payer: 59 | Admitting: Family

## 2020-06-29 ENCOUNTER — Other Ambulatory Visit: Payer: Self-pay

## 2020-06-29 ENCOUNTER — Encounter: Payer: Self-pay | Admitting: Family

## 2020-06-29 ENCOUNTER — Ambulatory Visit (HOSPITAL_BASED_OUTPATIENT_CLINIC_OR_DEPARTMENT_OTHER)
Admission: RE | Admit: 2020-06-29 | Discharge: 2020-06-29 | Disposition: A | Discharge status: Home / Self Care | Payer: 59 | Source: Ambulatory Visit | Attending: Family | Admitting: Family

## 2020-06-29 VITALS — BP 131/69 | HR 64 | Temp 98.6°F | Resp 16 | Ht 69.0 in | Wt 233.6 lb

## 2020-06-29 DIAGNOSIS — M5416 Radiculopathy, lumbar region: Secondary | ICD-10-CM | POA: Diagnosis not present

## 2020-06-29 DIAGNOSIS — R5383 Other fatigue: Secondary | ICD-10-CM

## 2020-06-29 DIAGNOSIS — Z23 Encounter for immunization: Secondary | ICD-10-CM | POA: Diagnosis not present

## 2020-06-29 NOTE — Patient Instructions (Addendum)
Please complete x-ray on the first floor. Complete lab work prior to leaving.  Try to do back exercises twice daily.   Back Exercises The following exercises strengthen the muscles that help to support the trunk and back. They also help to keep the lower back flexible. Doing these exercises can help to prevent back pain or lessen existing pain.  If you have back pain or discomfort, try doing these exercises 2-3 times each day or as told by your health care provider.  As your pain improves, do them once each day, but increase the number of times that you repeat the steps for each exercise (do more repetitions).  To prevent the recurrence of back pain, continue to do these exercises once each day or as told by your health care provider. Do exercises exactly as told by your health care provider and adjust them as directed. It is normal to feel mild stretching, pulling, tightness, or discomfort as you do these exercises, but you should stop right away if you feel sudden pain or your pain gets worse. Exercises Single knee to chest Repeat these steps 3-5 times for each leg: 1. Lie on your back on a firm bed or the floor with your legs extended. 2. Bring one knee to your chest. Your other leg should stay extended and in contact with the floor. 3. Hold your knee in place by grabbing your knee or thigh with both hands and hold. 4. Pull on your knee until you feel a gentle stretch in your lower back or buttocks. 5. Hold the stretch for 10-30 seconds. 6. Slowly release and straighten your leg. Pelvic tilt Repeat these steps 5-10 times: 1. Lie on your back on a firm bed or the floor with your legs extended. 2. Bend your knees so they are pointing toward the ceiling and your feet are flat on the floor. 3. Tighten your lower abdominal muscles to press your lower back against the floor. This motion will tilt your pelvis so your tailbone points up toward the ceiling instead of pointing to your feet or the  floor. 4. With gentle tension and even breathing, hold this position for 5-10 seconds. Cat-cow Repeat these steps until your lower back becomes more flexible: 1. Get into a hands-and-knees position on a firm surface. Keep your hands under your shoulders, and keep your knees under your hips. You may place padding under your knees for comfort. 2. Let your head hang down toward your chest. Contract your abdominal muscles and point your tailbone toward the floor so your lower back becomes rounded like the back of a cat. 3. Hold this position for 5 seconds. 4. Slowly lift your head, let your abdominal muscles relax and point your tailbone up toward the ceiling so your back forms a sagging arch like the back of a cow. 5. Hold this position for 5 seconds.  Press-ups Repeat these steps 5-10 times: 1. Lie on your abdomen (face-down) on the floor. 2. Place your palms near your head, about shoulder-width apart. 3. Keeping your back as relaxed as possible and keeping your hips on the floor, slowly straighten your arms to raise the top half of your body and lift your shoulders. Do not use your back muscles to raise your upper torso. You may adjust the placement of your hands to make yourself more comfortable. 4. Hold this position for 5 seconds while you keep your back relaxed. 5. Slowly return to lying flat on the floor.  Bridges Repeat these steps  10 times: 1. Lie on your back on a firm surface. 2. Bend your knees so they are pointing toward the ceiling and your feet are flat on the floor. Your arms should be flat at your sides, next to your body. 3. Tighten your buttocks muscles and lift your buttocks off the floor until your waist is at almost the same height as your knees. You should feel the muscles working in your buttocks and the back of your thighs. If you do not feel these muscles, slide your feet 1-2 inches farther away from your buttocks. 4. Hold this position for 3-5 seconds. 5. Slowly lower  your hips to the starting position, and allow your buttocks muscles to relax completely. If this exercise is too easy, try doing it with your arms crossed over your chest. Abdominal crunches Repeat these steps 5-10 times: 1. Lie on your back on a firm bed or the floor with your legs extended. 2. Bend your knees so they are pointing toward the ceiling and your feet are flat on the floor. 3. Cross your arms over your chest. 4. Tip your chin slightly toward your chest without bending your neck. 5. Tighten your abdominal muscles and slowly raise your trunk (torso) high enough to lift your shoulder blades a tiny bit off the floor. Avoid raising your torso higher than that because it can put too much stress on your low back and does not help to strengthen your abdominal muscles. 6. Slowly return to your starting position. Back lifts Repeat these steps 5-10 times: 1. Lie on your abdomen (face-down) with your arms at your sides, and rest your forehead on the floor. 2. Tighten the muscles in your legs and your buttocks. 3. Slowly lift your chest off the floor while you keep your hips pressed to the floor. Keep the back of your head in line with the curve in your back. Your eyes should be looking at the floor. 4. Hold this position for 3-5 seconds. 5. Slowly return to your starting position. Contact a health care provider if:  Your back pain or discomfort gets much worse when you do an exercise.  Your worsening back pain or discomfort does not lessen within 2 hours after you exercise. If you have any of these problems, stop doing these exercises right away. Do not do them again unless your health care provider says that you can. Get help right away if:  You develop sudden, severe back pain. If this happens, stop doing the exercises right away. Do not do them again unless your health care provider says that you can. This information is not intended to replace advice given to you by your health care  provider. Make sure you discuss any questions you have with your health care provider. Document Revised: 12/18/2018 Document Reviewed: 05/15/2018 Elsevier Patient Education  [place].

## 2020-06-29 NOTE — Progress Notes (Signed)
Subjective:    Patient ID: Ryan [name]., male    DOB: [birth year], 64 y.o.   MRN: [id number]  HPI  Patient is a 64 yr old male who presents today with c/o low back pain. Reports that symptoms briefly improved with anti-inflammatory.  Pain comes and goes- worse at night when he is sleeping.  Pain starts in his lower back and wraps around the right anterior thigh. Prolonged standing causes him low back pain. Reports that his legs and back tend to fatigue when he walks significant distances.   Had pfizer x 2.  4/6 and 4/22    Review of Systems See HPI  Past Medical History:  Diagnosis Date   Abnormal liver function    history of    Atypical chest pain    Blind left eye    blind left eye since birth   Cancer [facility])    skin:  basal cell carcinoma/ on face,back and neck   Cataract    Bil   Diastolic dysfunction    J.01/8114 Echo: EF 55-60%, Gr1 DD, triv MR.   Fatty liver    fatty liver disease   GERD (gastroesophageal reflux disease)    History of indigestion    Hyperlipidemia    Hypertension    Non-obstructive CAD (coronary artery disease)    a. 2016 Neg MV; b. 10/2017 Cath: LM nl, LAD 68m, D1/2/3 nl, LCX 20p, RCA nl, EF 50-55%.   SOB (shortness of breath) 2019   after climbing stairs     Social History   Socioeconomic History   Marital status: Married    Spouse name: Not on file   Number of children: 1   Years of education: Not on file   Highest education level: Not on file  Occupational History   Occupation: Engineer, building services    Employer: [employer]  Tobacco Use   Smoking status: Light Tobacco Smoker    Types: Cigars   Smokeless tobacco: Never Used   Tobacco comment: occasional cigar  Vaping Use   Vaping Use: Never used  Substance and Sexual Activity   Alcohol use: Yes    Alcohol/week: 2.0 standard drinks    Types: 1 Cans of beer, 1 Standard drinks or equivalent per week    Comment: occasional drink   Drug use: No    Sexual activity: Not on file  Other Topics Concern   Not on file  Social History Narrative      Social Determinants of Health   Financial Resource Strain:    Difficulty of Paying Living Expenses: Not on file  Food Insecurity:    Worried About Charity fundraiser in the Last Year: Not on file   [employer] of Food in the Last Year: Not on file  Transportation Needs:    Lack of Transportation (Medical): Not on file   Lack of Transportation (Non-Medical): Not on file  Physical Activity:    Days of Exercise per Week: Not on file   Minutes of Exercise per Session: Not on file  Stress:    Feeling of Stress : Not on file  Social Connections:    Frequency of Communication with Friends and Family: Not on file   Frequency of Social Gatherings with Friends and Family: Not on file   Attends Religious Services: Not on file   Active Member of Clubs or Organizations: Not on file   Attends Archivist Meetings: Not on file   Marital Status: Not on file  Intimate Partner Violence:    Fear of Current or Ex-Partner: Not on file   Emotionally Abused: Not on file   Physically Abused: Not on file   Sexually Abused: Not on file    Past Surgical History:  Procedure Laterality Date   CARDIAC CATHETERIZATION     COLONOSCOPY     LEFT HEART CATH AND CORONARY ANGIOGRAPHY Left 10/29/2017   Procedure: LEFT HEART CATH AND CORONARY ANGIOGRAPHY;  Surgeon: Nelva Bush, MD;  Location: [facility];  Service: Cardiovascular;  Laterality: Left;   TONSILLECTOMY  1965   as a child    Family History  Problem Relation Age of Onset   Colon cancer Mother        deceased age 55   Diabetes Mother    Hypertension Mother    Lung cancer Father        deceased age 64   Heart disease Father        Bypass in mid-late 43's   Heart attack Father        49   Diabetes Sister    Colon polyps Neg Hx    Esophageal cancer Neg Hx    Stomach cancer Neg Hx    Rectal  cancer Neg Hx     Allergies  Allergen Reactions   Atorvastatin Other (See Comments)    Hands swelled   Tape     Caused redness    Current Outpatient Medications on File Prior to Visit  Medication Sig Dispense Refill   aspirin EC 81 MG tablet Take 1 tablet (81 mg total) by mouth daily. 90 tablet 3   Omega-3 Fatty Acids (FISH OIL) 1200 MG CAPS Take 4,800 mg by mouth daily.      psyllium (METAMUCIL) 58.6 % powder Take 1 packet by mouth as needed.     rosuvastatin (CRESTOR) 20 MG tablet TAKE 1 TABLET BY MOUTH DAILY. 30 tablet 1   furosemide (LASIX) 20 MG tablet Take 1 tablet (20 mg total) by mouth daily as needed. (Patient not taking: Reported on 02/10/2020) 90 tablet 3   losartan (COZAAR) 25 MG tablet Take 1 tablet (25 mg total) by mouth daily. 90 tablet 3   meloxicam (MOBIC) 7.5 MG tablet TAKE 1 TABLET (7.5 MG TOTAL) BY MOUTH DAILY. (Patient not taking: Reported on 06/29/2020) 14 tablet 0   No current facility-administered medications on file prior to visit.    BP 131/69 (BP Location: Right Arm, Patient Position: Sitting, Cuff Size: Large)    Pulse 64    Temp 98.6 F (37 C) (Oral)    Resp 16    Ht 5\' 9"  (1.753 m)    Wt 233 lb 9.6 oz (106 kg)    SpO2 99%    BMI 34.50 kg/m       Objective:   Physical Exam Constitutional:      General: He is not in acute distress.    Appearance: He is well-developed.  HENT:     Head: Normocephalic and atraumatic.  Cardiovascular:     Rate and Rhythm: Normal rate and regular rhythm.     Heart sounds: No murmur heard.   Pulmonary:     Effort: Pulmonary effort is normal. No respiratory distress.     Breath sounds: Normal breath sounds. No wheezing or rales.  Skin:    General: Skin is warm and dry.  Neurological:     Mental Status: He is alert and oriented to person, place, and time.  Psychiatric:  Behavior: Behavior normal.        Thought Content: Thought content normal.           Assessment & Plan:  Lumbar  radiculopathy -  Unchanged. Declines meloxicam refill, declines MRI, declines PT referral. Will obtain a plain film of the lumbar spine and gave the patient some exercises to do at home.   This visit occurred during the SARS-CoV-2 public health emergency.  Safety protocols were in place, including screening questions prior to the visit, additional usage of staff PPE, and extensive cleaning of exam room while observing appropriate contact time as indicated for disinfecting solutions.

## 2020-06-30 LAB — COMPREHENSIVE METABOLIC PANEL
AG Ratio: 1.8 (calc) (ref 1.0–2.5)
ALT: 24 U/L (ref 9–46)
AST: 25 U/L (ref 10–35)
Albumin: 4.6 g/dL (ref 3.6–5.1)
Alkaline phosphatase (APISO): 64 U/L (ref 35–144)
BUN: 17 mg/dL (ref 7–25)
CO2: 28 mmol/L (ref 20–32)
Calcium: 10 mg/dL (ref 8.6–10.3)
Chloride: 104 mmol/L (ref 98–110)
Creat: 1.23 mg/dL (ref 0.70–1.25)
Globulin: 2.5 g/dL (calc) (ref 1.9–3.7)
Glucose, Bld: 105 mg/dL — ABNORMAL HIGH (ref 65–99)
Potassium: 4.9 mmol/L (ref 3.5–5.3)
Sodium: 139 mmol/L (ref 135–146)
Total Bilirubin: 0.6 mg/dL (ref 0.2–1.2)
Total Protein: 7.1 g/dL (ref 6.1–8.1)

## 2020-06-30 LAB — CBC WITH DIFFERENTIAL/PLATELET
Absolute Monocytes: 403 cells/uL (ref 200–950)
Basophils Absolute: 32 cells/uL (ref 0–200)
Basophils Relative: 0.6 %
Eosinophils Absolute: 212 cells/uL (ref 15–500)
Eosinophils Relative: 4 %
HCT: 44.8 % (ref 38.5–50.0)
Hemoglobin: 15.3 g/dL (ref 13.2–17.1)
Lymphs Abs: 1532 cells/uL (ref 850–3900)
MCH: 30.2 pg (ref 27.0–33.0)
MCHC: 34.2 g/dL (ref 32.0–36.0)
MCV: 88.5 fL (ref 80.0–100.0)
MPV: 11.2 fL (ref 7.5–12.5)
Monocytes Relative: 7.6 %
Neutro Abs: 3122 cells/uL (ref 1500–7800)
Neutrophils Relative %: 58.9 %
Platelets: 203 10*3/uL (ref 140–400)
RBC: 5.06 10*6/uL (ref 4.20–5.80)
RDW: 12.7 % (ref 11.0–15.0)
Total Lymphocyte: 28.9 %
WBC: 5.3 10*3/uL (ref 3.8–10.8)

## 2020-06-30 LAB — TSH: TSH: 1.81 mIU/L (ref 0.40–4.50)

## 2020-07-13 ENCOUNTER — Other Ambulatory Visit: Payer: Self-pay

## 2020-07-13 ENCOUNTER — Encounter: Payer: Self-pay | Admitting: Family

## 2020-07-13 ENCOUNTER — Ambulatory Visit (INDEPENDENT_AMBULATORY_CARE_PROVIDER_SITE_OTHER): Payer: 59 | Admitting: Family

## 2020-07-13 VITALS — BP 140/68 | HR 70 | Temp 98.3°F | Resp 16 | Ht 69.0 in | Wt 236.0 lb

## 2020-07-13 DIAGNOSIS — M5416 Radiculopathy, lumbar region: Secondary | ICD-10-CM | POA: Diagnosis not present

## 2020-07-13 DIAGNOSIS — E785 Hyperlipidemia, unspecified: Secondary | ICD-10-CM

## 2020-07-13 DIAGNOSIS — I10 Essential (primary) hypertension: Secondary | ICD-10-CM

## 2020-07-13 DIAGNOSIS — Z0000 Encounter for general adult medical examination without abnormal findings: Secondary | ICD-10-CM | POA: Diagnosis not present

## 2020-07-13 NOTE — Progress Notes (Signed)
Subjective:    Patient ID: Ryan [name]., male    DOB: 1955-12-04, 64 y.o.   MRN: [id number]  HPI   Patient presents today for complete physical.  Immunizations: pfizer x 2, flu shot up to date.  Has not had Shingrix.  Tdap 2013. Declines shingrix.  Diet:  healthy Wt Readings from Last 3 Encounters:  07/13/20 236 lb (107 kg)  06/29/20 233 lb 9.6 oz (106 kg)  02/10/20 231 lb 6.4 oz (105 kg)  Exercise: exercise bike 10-15 minutes a day, cutting wood outside  Colonoscopy: 09/05/18 Vision: 1 month ago Dental: up to date Lab Results  Component Value Date   PSA 1.1 09/18/2019   PSA 2.12 08/26/2019   PSA 1.20 08/13/2016   Has myalgias.  Some joint pain in his fingers.   HTN- maintained on losartan 25mg  once daily.  BP Readings from Last 3 Encounters:  07/13/20 140/68  06/29/20 131/69  02/10/20 120/68   Hyperlipidemia- maintained on rosuvastatin 20mg .  Lab Results  Component Value Date   CHOL 143 08/26/2019   HDL 58.10 08/26/2019   LDLCALC 72 08/26/2019   LDLDIRECT 144.3 11/17/2007   TRIG 64.0 08/26/2019   CHOLHDL 2 08/26/2019   Lumbar radiculopathy- improved.    Review of Systems  Constitutional: Negative for unexpected weight change.  HENT: Negative for hearing loss and rhinorrhea.   Eyes: Negative for visual disturbance.  Respiratory: Negative for cough and shortness of breath.   Cardiovascular: Negative for chest pain.  Gastrointestinal: Negative for constipation (as long as he takes fiber supplements and high fiber diet) and diarrhea.  Genitourinary: Negative for difficulty urinating and hematuria.       Fair stream but overall OK  Musculoskeletal: Positive for arthralgias and myalgias.  Skin: Negative for rash.  Neurological: Positive for headaches (occasional).  Hematological: Negative for adenopathy.  Psychiatric/Behavioral:       Denies depression/anxiety       Past Medical History:  Diagnosis Date  . Abnormal liver function    history of   .  Atypical chest pain   . Blind left eye    blind left eye since birth  . Cancer ([place])    skin:  basal cell carcinoma/ on face,back and neck  . Cataract    Bil  . Diastolic dysfunction    P.08/256 Echo: EF 55-60%, Gr1 DD, triv MR.  . Fatty liver    fatty liver disease  . GERD (gastroesophageal reflux disease)   . History of indigestion   . Hyperlipidemia   . Hypertension   . Non-obstructive CAD (coronary artery disease)    a. 2016 Neg MV; b. 10/2017 Cath: LM nl, LAD 90m, D1/2/3 nl, LCX 20p, RCA nl, EF 50-55%.  . SOB (shortness of breath) 2019   after climbing stairs     Social History   Socioeconomic History  . Marital status: Married    Spouse name: Not on file  . Number of children: 1  . Years of education: Not on file  . Highest education level: Not on file  Occupational History  . Occupation: Designer, industrial/product: [employer]  Tobacco Use  . Smoking status: Light Tobacco Smoker    Types: Cigars  . Smokeless tobacco: Never Used  . Tobacco comment: occasional cigar  Vaping Use  . Vaping Use: Never used  Substance and Sexual Activity  . Alcohol use: Yes    Alcohol/week: 2.0 standard drinks    Types: 1 Cans of  beer, 1 Standard drinks or equivalent per week    Comment: occasional drink  . Drug use: No  . Sexual activity: Not on file  Other Topics Concern  . Not on file  Social History Narrative   Retired   Investment banker, operational of Radio broadcast assistant Strain:   . Difficulty of Paying Living Expenses: Not on file  Food Insecurity:   . Worried About Charity fundraiser in the Last Year: Not on file  . Ran Out of Food in the Last Year: Not on file  Transportation Needs:   . Lack of Transportation (Medical): Not on file  . Lack of Transportation (Non-Medical): Not on file  Physical Activity:   . Days of Exercise per Week: Not on file  . Minutes of Exercise per Session: Not on file  Stress:   . Feeling of Stress : Not on file  Social  Connections:   . Frequency of Communication with Friends and Family: Not on file  . Frequency of Social Gatherings with Friends and Family: Not on file  . Attends Religious Services: Not on file  . Active Member of Clubs or Organizations: Not on file  . Attends Archivist Meetings: Not on file  . Marital Status: Not on file  Intimate Partner Violence:   . Fear of Current or Ex-Partner: Not on file  . Emotionally Abused: Not on file  . Physically Abused: Not on file  . Sexually Abused: Not on file    Past Surgical History:  Procedure Laterality Date  . CARDIAC CATHETERIZATION    . COLONOSCOPY    . LEFT HEART CATH AND CORONARY ANGIOGRAPHY Left 10/29/2017   Procedure: LEFT HEART CATH AND CORONARY ANGIOGRAPHY;  Surgeon: Nelva Bush, MD;  Location: [facility];  Service: Cardiovascular;  Laterality: Left;  . TONSILLECTOMY  1965   as a child    Family History  Problem Relation Age of Onset  . Colon cancer Mother        deceased age 78  . Diabetes Mother   . Hypertension Mother   . Lung cancer Father        deceased age 81  . Heart disease Father        Bypass in mid-late 40's  . Heart attack Father        32  . Sinusitis Sister   . Diabetes Sister   . Colon polyps Neg Hx   . Esophageal cancer Neg Hx   . Stomach cancer Neg Hx   . Rectal cancer Neg Hx     Allergies  Allergen Reactions  . Atorvastatin Other (See Comments)    Hands swelled  . Tape     Caused redness    Current Outpatient Medications on File Prior to Visit  Medication Sig Dispense Refill  . aspirin EC 81 MG tablet Take 1 tablet (81 mg total) by mouth daily. 90 tablet 3  . Omega-3 Fatty Acids (FISH OIL) 1200 MG CAPS Take 4,800 mg by mouth daily.     . psyllium (METAMUCIL) 58.6 % powder Take 1 packet by mouth as needed.    . rosuvastatin (CRESTOR) 20 MG tablet TAKE 1 TABLET BY MOUTH DAILY. 30 tablet 1  . losartan (COZAAR) 25 MG tablet Take 1 tablet (25 mg total) by mouth daily. 90  tablet 3   No current facility-administered medications on file prior to visit.    BP 140/68   Pulse 70   Temp 98.3 F (36.8  C) (Oral)   Resp 16   Ht 5\' 9"  (1.753 m)   Wt 236 lb (107 kg)   SpO2 100%   BMI 34.85 kg/m    Objective:   Physical Exam Physical Exam  Constitutional: He is oriented to person, place, and time. He appears well-developed and well-nourished. No distress.  HENT:  Head: Normocephalic and atraumatic.  Right Ear: Tympanic membrane and ear canal normal.  Left Ear: Tympanic membrane and ear canal normal.  Mouth/Throat: Not examined- pt wearing mask Eyes: Pupils are equal, round, and reactive to light. No scleral icterus.  Neck: Normal range of motion. No thyromegaly present.  Cardiovascular: Normal rate and regular rhythm.   No murmur heard. Pulmonary/Chest: Effort normal and breath sounds normal. No respiratory distress. He has no wheezes. He has no rales. He exhibits no tenderness.  Abdominal: Soft. Bowel sounds are normal. He exhibits no distension and no mass. There is no tenderness. There is no rebound and no guarding.  Musculoskeletal: He exhibits no edema.  Lymphadenopathy:    He has no cervical adenopathy.  Neurological: He is alert and oriented to person, place, and time. He has normal patellar reflexes. He exhibits normal muscle tone. Coordination normal.  Skin: Skin is warm and dry.  Psychiatric: He has a normal mood and affect. His behavior is normal. Judgment and thought content normal.           Assessment & Plan:   Preventative care-  Discussed healthy diet, exercise and weight loss.  He declines shingrix. Other immunizations are reviewed and up to date. PSA/colo up to date.   Hyperlipidemia- due to myalgias, I recommended that the patient hold crestor for 1 week, and then call me to let me know if his myalgias improve. Lipids at goal last time they were checked.  HTN- bp stable on losartan 25mg  once daily.  Lumbar radiculopathy-  improved. Monitor.  This visit occurred during the SARS-CoV-2 public health emergency.  Safety protocols were in place, including screening questions prior to the visit, additional usage of staff PPE, and extensive cleaning of exam room while observing appropriate contact time as indicated for disinfecting solutions.       Assessment & Plan:

## 2020-07-13 NOTE — Patient Instructions (Signed)
Hold Crestor for 1 week and then send me a message to let me know how your muscle pain is doing.

## 2020-08-12 ENCOUNTER — Other Ambulatory Visit (HOSPITAL_BASED_OUTPATIENT_CLINIC_OR_DEPARTMENT_OTHER): Payer: Self-pay | Admitting: Internal Medicine

## 2020-08-12 ENCOUNTER — Ambulatory Visit: Payer: 59 | Attending: Internal Medicine

## 2020-08-12 DIAGNOSIS — Z23 Encounter for immunization: Secondary | ICD-10-CM

## 2020-08-12 NOTE — Progress Notes (Signed)
° °  [id number]  Name:  Ryan [name].    MRN: [id number] DOB: 04-10-1956  08/12/2020  [name] was observed post Covid-19 immunization for 15 minutes without incident. He was provided with Vaccine Information Sheet and instruction to access the V-Safe system.   [name] was instructed to call 911 with any severe reactions post vaccine:  Difficulty breathing   Swelling of face and throat   A fast heartbeat   A bad rash all over body   Dizziness and weakness   Immunizations Administered    Name Date Dose VIS Date Route   Pfizer COVID-19 Vaccine 08/12/2020 12:56 PM 0.3 mL 06/15/2020 Intramuscular   Manufacturer: [place]   Lot: [id number]   [place]: [id number]

## 2020-08-15 MED FILL — PFIZER-BIONTECH COVID-19 VA: 30 | 1 days supply | Qty: 0 | Fill #0

## 2020-09-30 ENCOUNTER — Encounter: Payer: Self-pay | Admitting: Medical

## 2020-09-30 ENCOUNTER — Ambulatory Visit (INDEPENDENT_AMBULATORY_CARE_PROVIDER_SITE_OTHER): Payer: 59 | Admitting: Medical

## 2020-09-30 ENCOUNTER — Other Ambulatory Visit: Payer: Self-pay

## 2020-09-30 VITALS — BP 130/70 | HR 72 | Temp 98.3°F | Resp 18 | Ht 69.0 in | Wt 236.0 lb

## 2020-09-30 DIAGNOSIS — R350 Frequency of micturition: Secondary | ICD-10-CM

## 2020-09-30 DIAGNOSIS — R102 Pelvic and perineal pain: Secondary | ICD-10-CM

## 2020-09-30 DIAGNOSIS — R319 Hematuria, unspecified: Secondary | ICD-10-CM

## 2020-09-30 DIAGNOSIS — I10 Essential (primary) hypertension: Secondary | ICD-10-CM | POA: Diagnosis not present

## 2020-09-30 DIAGNOSIS — Z87442 Personal history of urinary calculi: Secondary | ICD-10-CM

## 2020-09-30 LAB — CBC WITH DIFFERENTIAL/PLATELET
Basophils Absolute: 0 10*3/uL (ref 0.0–0.1)
Basophils Relative: 0.8 % (ref 0.0–3.0)
Eosinophils Absolute: 0.1 10*3/uL (ref 0.0–0.7)
Eosinophils Relative: 2.8 % (ref 0.0–5.0)
HCT: 41.5 % (ref 39.0–52.0)
Hemoglobin: 14.4 g/dL (ref 13.0–17.0)
Lymphocytes Relative: 28.5 % (ref 12.0–46.0)
Lymphs Abs: 1.5 10*3/uL (ref 0.7–4.0)
MCHC: 34.6 g/dL (ref 30.0–36.0)
MCV: 87.9 fl (ref 78.0–100.0)
Monocytes Absolute: 0.4 10*3/uL (ref 0.1–1.0)
Monocytes Relative: 8.1 % (ref 3.0–12.0)
Neutro Abs: 3 10*3/uL (ref 1.4–7.7)
Neutrophils Relative %: 59.8 % (ref 43.0–77.0)
Platelets: 186 10*3/uL (ref 150.0–400.0)
RBC: 4.72 Mil/uL (ref 4.22–5.81)
RDW: 12.4 % (ref 11.5–15.5)
WBC: 5.1 10*3/uL (ref 4.0–10.5)

## 2020-09-30 LAB — POC URINALSYSI DIPSTICK (AUTOMATED)
Bilirubin, UA: NEGATIVE
Glucose, UA: NEGATIVE
Ketones, UA: NEGATIVE
Leukocytes, UA: NEGATIVE
Nitrite, UA: NEGATIVE
Protein, UA: NEGATIVE
Spec Grav, UA: 1.01 (ref 1.010–1.025)
Urobilinogen, UA: 0.2 E.U./dL
pH, UA: 6.5 (ref 5.0–8.0)

## 2020-09-30 LAB — COMPREHENSIVE METABOLIC PANEL
ALT: 24 U/L (ref 0–53)
AST: 23 U/L (ref 0–37)
Albumin: 4.4 g/dL (ref 3.5–5.2)
Alkaline Phosphatase: 69 U/L (ref 39–117)
BUN: 17 mg/dL (ref 6–23)
CO2: 30 mEq/L (ref 19–32)
Calcium: 9.5 mg/dL (ref 8.4–10.5)
Chloride: 105 mEq/L (ref 96–112)
Creatinine, Ser: 1.11 mg/dL (ref 0.40–1.50)
GFR: 71.1 mL/min (ref >60.00)
Glucose, Bld: 92 mg/dL (ref 70–99)
Potassium: 4.9 mEq/L (ref 3.5–5.1)
Sodium: 139 mEq/L (ref 135–145)
Total Bilirubin: 0.5 mg/dL (ref 0.2–1.2)
Total Protein: 7 g/dL (ref 6.0–8.3)

## 2020-09-30 LAB — PSA: PSA: 2.32 ng/mL (ref 0.10–4.00)

## 2020-09-30 MED ORDER — CIPROFLOXACIN HCL 500 MG PO TABS: 500.0000 mg | ORAL_TABLET | Freq: Two times a day (BID) | ORAL | 0 refills | Status: DC

## 2020-09-30 NOTE — Progress Notes (Signed)
Subjective:    Patient ID: Ryan [name]., male    DOB: August 04, 1956, 65 y.o.   MRN: [id number]  HPI  Pt in for for some recent dark/brown appearance to urine today. He hydrated well and today when gave sample looked clearer.   Pt has some pressure over bladder.   Pt states some frequent urination.   No back pain. Pt has hx of smoking rare cigar occasionally. About 2 cigars a month for years. Second smoke from parents. 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        Review of Systems  Constitutional: Negative for chills, fatigue and fever.  HENT: Negative for dental problem.   Respiratory: Negative for cough, choking and wheezing.   Cardiovascular: Negative for chest pain and palpitations.  Gastrointestinal: Negative for abdominal pain.  Genitourinary: Positive for frequency. Negative for hematuria.  Musculoskeletal: Negative for back pain, gait problem and neck pain.  Skin: Negative for rash.  Neurological: Negative for facial asymmetry.  Hematological: Negative for adenopathy. Does not bruise/bleed easily.  Psychiatric/Behavioral: Negative for behavioral problems.    Past Medical History:  Diagnosis Date  . Abnormal liver function    history of   . Atypical chest pain   . Blind left eye    blind left eye since birth  . Cancer ([place])    skin:  basal cell carcinoma/ on face,back and neck  . Cataract    Bil  . Diastolic dysfunction    N.03/2955 Echo: EF 55-60%, Gr1 DD, triv MR.  . Fatty liver    fatty liver disease  . GERD (gastroesophageal reflux disease)   . History of indigestion   . Hyperlipidemia   . Hypertension   . Non-obstructive CAD (coronary artery disease)    a. 2016 Neg MV; b. 10/2017 Cath: LM nl, LAD 15m, D1/2/3 nl, LCX 20p, RCA nl, EF 50-55%.  . SOB (shortness of breath) 2019   after climbing stairs     Social History   Socioeconomic History  . Marital status: Married    Spouse name: Not on file  . Number of children: 1  . Years  of education: Not on file  . Highest education level: Not on file  Occupational History  . Occupation: Designer, industrial/product: [employer]  Tobacco Use  . Smoking status: Light Tobacco Smoker    Types: Cigars  . Smokeless tobacco: Never Used  . Tobacco comment: occasional cigar  Vaping Use  . Vaping Use: Never used  Substance and Sexual Activity  . Alcohol use: Yes    Alcohol/week: 2.0 standard drinks    Types: 1 Cans of beer, 1 Standard drinks or equivalent per week    Comment: occasional drink  . Drug use: No  . Sexual activity: Not on file  Other Topics Concern  . Not on file  Social History Narrative   Retired   Investment banker, operational of Radio broadcast assistant Strain: Not on [employer] Insecurity: Not on file  Transportation Needs: Not on file  Physical Activity: Not on file  Stress: Not on file  Social Connections: Not on file  Intimate Partner Violence: Not on file    Past Surgical History:  Procedure Laterality Date  . CARDIAC CATHETERIZATION    . COLONOSCOPY    . LEFT HEART CATH AND CORONARY ANGIOGRAPHY Left 10/29/2017   Procedure: LEFT HEART CATH AND CORONARY ANGIOGRAPHY;  Surgeon: Nelva Bush, MD;  Location: [facility];  Service: Cardiovascular;  Laterality: Left;  . TONSILLECTOMY  1965   as a child    Family History  Problem Relation Age of Onset  . Colon cancer Mother        deceased age 67  . Diabetes Mother   . Hypertension Mother   . Lung cancer Father        deceased age 30  . Heart disease Father        Bypass in mid-late 94's  . Heart attack Father        49  . Sinusitis Sister   . Diabetes Sister   . Colon polyps Neg Hx   . Esophageal cancer Neg Hx   . Stomach cancer Neg Hx   . Rectal cancer Neg Hx     Allergies  Allergen Reactions  . Atorvastatin Other (See Comments)    Hands swelled  . Tape     Caused redness    Current Outpatient Medications on File Prior to Visit  Medication Sig Dispense Refill   . aspirin EC 81 MG tablet Take 1 tablet (81 mg total) by mouth daily. 90 tablet 3  . Omega-3 Fatty Acids (FISH OIL) 1200 MG CAPS Take 4,800 mg by mouth daily.     . psyllium (METAMUCIL) 58.6 % powder Take 1 packet by mouth as needed.    . rosuvastatin (CRESTOR) 20 MG tablet TAKE 1 TABLET BY MOUTH DAILY. 30 tablet 1  . losartan (COZAAR) 25 MG tablet Take 1 tablet (25 mg total) by mouth daily. 90 tablet 3   No current facility-administered medications on file prior to visit.    BP (!) 146/78   Pulse 72   Temp 98.3 F (36.8 C) (Oral)   Resp 18   Ht 5\' 9"  (1.753 m)   Wt 236 lb (107 kg)   SpO2 99%   BMI 34.85 kg/m      Objective:   Physical Exam  General Mental Status- Alert. General Appearance- Not in acute distress.   Skin General: Color- Normal Color. Moisture- Normal Moisture.  Neck Carotid Arteries- Normal color. Moisture- Normal Moisture. No carotid bruits. No JVD.  Chest and Lung Exam Auscultation: Breath Sounds:-Normal.  Cardiovascular Auscultation:Rythm- Regular. Murmurs & Other Heart Sounds:Auscultation of the heart reveals- No Murmurs.  Abdomen Inspection:-Inspeection Normal. Palpation/Percussion:Note:No mass. Palpation and Percussion of the abdomen reveal- Non Tender, Non Distended + BS, no rebound or guarding.   Neurologic Cranial Nerve exam:- CN III-XII intact(No nystagmus), symmetric smile. Strength:- 5/5 equal and symmetric strength both upper and lower extremities.      Assessment & Plan:  For hx of frequent urination, suprapubic pressure and blood in urine will rx cipro 500 mg twice daily for 10 days. Will get cmp, urine culture and psa.   Benefit and risk of meds discussed.  Hx of htn. On recheck bp is controlled on recheck. Continue lorsatan.  Follow up 7-10 days or as needed

## 2020-09-30 NOTE — Patient Instructions (Addendum)
For hx of frequent urination, suprapubic pressure and blood in urine will rx cipro 500 mg twice daily for 10 days. Will get cmp, urine culture and psa.   Concern for early uti vs prostatis.  Will need to recheck your in 2 weeks urine for blood in 2 weeks.  Benefit and risk of meds discussed.  Hx of htn. On recheck bp is controlled on recheck. Continue lorsatan.  Follow up 10-14  days or as needed

## 2020-10-01 LAB — URINE CULTURE
MICRO NUMBER:: 11496956
Result:: NO GROWTH
SPECIMEN QUALITY:: ADEQUATE

## 2020-10-02 ENCOUNTER — Telehealth: Payer: Self-pay | Admitting: Medical

## 2020-10-02 DIAGNOSIS — R350 Frequency of micturition: Secondary | ICD-10-CM

## 2020-10-02 DIAGNOSIS — R319 Hematuria, unspecified: Secondary | ICD-10-CM

## 2020-10-02 NOTE — Telephone Encounter (Signed)
Referral to urologist placed. 

## 2020-10-04 ENCOUNTER — Ambulatory Visit: Payer: 59 | Admitting: Family

## 2020-10-13 ENCOUNTER — Ambulatory Visit (INDEPENDENT_AMBULATORY_CARE_PROVIDER_SITE_OTHER): Payer: 59 | Admitting: Medical

## 2020-10-13 ENCOUNTER — Other Ambulatory Visit: Payer: Self-pay

## 2020-10-13 VITALS — BP 125/81 | HR 71 | Resp 18 | Ht 69.0 in | Wt 233.4 lb

## 2020-10-13 DIAGNOSIS — R110 Nausea: Secondary | ICD-10-CM

## 2020-10-13 DIAGNOSIS — R319 Hematuria, unspecified: Secondary | ICD-10-CM

## 2020-10-13 DIAGNOSIS — K5909 Other constipation: Secondary | ICD-10-CM

## 2020-10-13 DIAGNOSIS — R109 Unspecified abdominal pain: Secondary | ICD-10-CM | POA: Diagnosis not present

## 2020-10-13 LAB — COMPREHENSIVE METABOLIC PANEL
ALT: 24 U/L (ref 0–53)
AST: 22 U/L (ref 0–37)
Albumin: 4.4 g/dL (ref 3.5–5.2)
Alkaline Phosphatase: 70 U/L (ref 39–117)
BUN: 18 mg/dL (ref 6–23)
CO2: 32 mEq/L (ref 19–32)
Calcium: 9.4 mg/dL (ref 8.4–10.5)
Chloride: 102 mEq/L (ref 96–112)
Creatinine, Ser: 1.11 mg/dL (ref 0.40–1.50)
GFR: 71.08 mL/min (ref >60.00)
Glucose, Bld: 91 mg/dL (ref 70–99)
Potassium: 4.5 mEq/L (ref 3.5–5.1)
Sodium: 136 mEq/L (ref 135–145)
Total Bilirubin: 0.7 mg/dL (ref 0.2–1.2)
Total Protein: 7.1 g/dL (ref 6.0–8.3)

## 2020-10-13 LAB — POC URINALSYSI DIPSTICK (AUTOMATED)
Bilirubin, UA: NEGATIVE
Blood, UA: NEGATIVE
Glucose, UA: NEGATIVE
Ketones, UA: NEGATIVE
Leukocytes, UA: NEGATIVE
Nitrite, UA: NEGATIVE
Protein, UA: POSITIVE — AB
Spec Grav, UA: 1.025 (ref 1.010–1.025)
Urobilinogen, UA: NEGATIVE E.U./dL — AB
pH, UA: 6 (ref 5.0–8.0)

## 2020-10-13 LAB — LIPASE: Lipase: 31 U/L (ref 11.0–59.0)

## 2020-10-13 NOTE — Progress Notes (Signed)
Subjective:    Patient ID: Ryan [name]., male    DOB: 08/26/1956, 65 y.o.   MRN: [id number]  HPI   Pt in for follow up.  Pt states he still feels some direct pressure over this bladder. He feels like he has to urinate. No obvious frequent urination or difficulty starting flow of urine.   Pt does have hx of smoking. No back pain. Pt has hx of smoking rare cigar occasionally. About 2 cigars a month for years. Second smoke from parents. 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  Pt symptoms persist despite use of antibiotic.  Remote history of trace blood in urine in the past. I put referral to urologist in. They have called pt. He has not called them back.    Pt mentions some mild abdomen pain when he bends over. But can have some transient pain on left side randomly. Pt states can tend to be constipated and only pass small round stools. Pt daily bm but about every other day just has pellet like stools. Pt takes metamucil and incresae fiber.   Pt hold colonoscopy in 2020. Polyp found. Told repeat in 5 years.   Review of Systems  Respiratory: Negative for cough, chest tightness and wheezing.   Cardiovascular: Negative for chest pain and palpitations.  Gastrointestinal: Positive for abdominal pain and constipation. Negative for abdominal distention, blood in stool, diarrhea and vomiting.       Occasional heat burn.  Musculoskeletal: Positive for back pain.       Chronic intermittent pain for years. [place] tractor about 10 hours yesterday. Occasional pain that runs to rt thigh area.   Past Medical History:  Diagnosis Date  . Abnormal liver function    history of   . Atypical chest pain   . Blind left eye    blind left eye since birth  . Cancer ([place])    skin:  basal cell carcinoma/ on face,back and neck  . Cataract    Bil  . Diastolic dysfunction    L.04/3789 Echo: EF 55-60%, Gr1 DD, triv MR.  . Fatty liver    fatty liver disease  . GERD (gastroesophageal reflux  disease)   . History of indigestion   . Hyperlipidemia   . Hypertension   . Non-obstructive CAD (coronary artery disease)    a. 2016 Neg MV; b. 10/2017 Cath: LM nl, LAD 76m, D1/2/3 nl, LCX 20p, RCA nl, EF 50-55%.  . SOB (shortness of breath) 2019   after climbing stairs     Social History   Socioeconomic History  . Marital status: Married    Spouse name: Not on file  . Number of children: 1  . Years of education: Not on file  . Highest education level: Not on file  Occupational History  . Occupation: Designer, industrial/product: [employer]  Tobacco Use  . Smoking status: Light Tobacco Smoker    Types: Cigars  . Smokeless tobacco: Never Used  . Tobacco comment: occasional cigar  Vaping Use  . Vaping Use: Never used  Substance and Sexual Activity  . Alcohol use: Yes    Alcohol/week: 2.0 standard drinks    Types: 1 Cans of beer, 1 Standard drinks or equivalent per week    Comment: occasional drink  . Drug use: No  . Sexual activity: Not on file  Other Topics Concern  . Not on file  Social History Narrative   Retired   Investment banker, operational  of Health   Financial Resource Strain: Not on file  Food Insecurity: Not on file  Transportation Needs: Not on file  Physical Activity: Not on file  Stress: Not on file  Social Connections: Not on file  Intimate Partner Violence: Not on file    Past Surgical History:  Procedure Laterality Date  . CARDIAC CATHETERIZATION    . COLONOSCOPY    . LEFT HEART CATH AND CORONARY ANGIOGRAPHY Left 10/29/2017   Procedure: LEFT HEART CATH AND CORONARY ANGIOGRAPHY;  Surgeon: Nelva Bush, MD;  Location: [facility];  Service: Cardiovascular;  Laterality: Left;  . TONSILLECTOMY  1965   as a child    Family History  Problem Relation Age of Onset  . Colon cancer Mother        deceased age 96  . Diabetes Mother   . Hypertension Mother   . Lung cancer Father        deceased age 40  . Heart disease Father        Bypass  in mid-late 13's  . Heart attack Father        77  . Sinusitis Sister   . Diabetes Sister   . Colon polyps Neg Hx   . Esophageal cancer Neg Hx   . Stomach cancer Neg Hx   . Rectal cancer Neg Hx     Allergies  Allergen Reactions  . Atorvastatin Other (See Comments)    Hands swelled  . Tape     Caused redness    Current Outpatient Medications on File Prior to Visit  Medication Sig Dispense Refill  . aspirin EC 81 MG tablet Take 1 tablet (81 mg total) by mouth daily. 90 tablet 3  . ciprofloxacin (CIPRO) 500 MG tablet Take 1 tablet (500 mg total) by mouth 2 (two) times daily. 20 tablet 0  . Omega-3 Fatty Acids (FISH OIL) 1200 MG CAPS Take 4,800 mg by mouth daily.     . psyllium (METAMUCIL) 58.6 % powder Take 1 packet by mouth as needed.    . rosuvastatin (CRESTOR) 20 MG tablet TAKE 1 TABLET BY MOUTH DAILY. 30 tablet 1  . losartan (COZAAR) 25 MG tablet Take 1 tablet (25 mg total) by mouth daily. 90 tablet 3   No current facility-administered medications on file prior to visit.    BP 125/81   Pulse 71   Resp 18   Ht 5\' 9"  (1.753 m)   Wt 233 lb 6.4 oz (105.9 kg)   SpO2 99%   BMI 34.47 kg/m       Objective:   Physical Exam  General Mental Status- Alert. General Appearance- Not in acute distress.   Skin General: Color- Normal Color. Moisture- Normal Moisture.  Neck Carotid Arteries- Normal color. Moisture- Normal Moisture. No carotid bruits. No JVD.  Chest and Lung Exam Auscultation: Breath Sounds:-Normal.  Cardiovascular Auscultation:Rythm- Regular. Murmurs & Other Heart Sounds:Auscultation of the heart reveals- No Murmurs.  Abdomen Inspection:-Inspeection Normal. Palpation/Percussion:Note:No mass. Palpation and Percussion of the abdomen reveal- only slight mid suprapubic region tender., Non Distended + BS, no rebound or guarding.   Neurologic Cranial Nerve exam:- CN III-XII intact(No nystagmus), symmetric smile. Strength:- 5/5 equal and symmetric strength  both upper and lower extremities.      Assessment & Plan:  You do have history of recent hematuria and review of epic labs some trace hematuria 4 years ago.  In addition light cigar smoker and some secondhand smoke as use.  Some persisting suprapubic pressure presently.  Urine does not show blood today on a UA.  Do think best that you follow through with urology referral.  They might do a cystoscopy.  History of chronic constipation.  Up-to-date on colonoscopy.  Recommend keep self hydrated and continue Metamucil.  Try to get some daily exercise.  Recent mild abdomen discomfort with occasional nausea.  Rare reflux.  Recommend healthy diet and can use Pepcid over-the-counter.  Will get a lipase today and then metabolic panel.  If pain worsens or changes let us know.  In that event might get imaging studies or refer to GI.  Follow-up in 4 to 6 weeks with her PCP or as needed.  Mackie Pai, PA-C

## 2020-10-13 NOTE — Patient Instructions (Signed)
You do have history of recent hematuria and review of epic labs some trace hematuria 4 years ago.  In addition light cigar smoker and some secondhand smoke as use.  Some persisting suprapubic pressure presently.  Urine does not show blood today on a UA.  Do think best that you follow through with urology referral.  They might do a cystoscopy.  History of chronic constipation.  Up-to-date on colonoscopy.  Recommend keep self hydrated and continue Metamucil.  Try to get some daily exercise.  Recent mild abdomen discomfort with occasional nausea.  Rare reflux.  Recommend healthy diet and can use Pepcid over-the-counter.  Will get a lipase today and then metabolic panel.  If pain worsens or changes let us know.  In that event might get imaging studies or refer to GI.  Discussed your history of back pain.  Reviewed her recent lumbar x-ray and 2014 MRI.  Pain is mild presently per your description.  You could use low-dose ibuprofen occasionally for pain.  If pain worsens or changes you might need MRI.  Follow-up in 4 to 6 weeks with her PCP or as needed.

## 2020-12-01 NOTE — Progress Notes (Signed)
HPI M cigar smoker followed for OSA, complicated by Blind L eye (congenital), CAD/ angina, Diastolic Dysfunction, HTN, Fatty Liver, GERD, Hyperlipidemia, Raynaud's, Tobacco use, Diastolic Dysfunction/ edema, HST- 09/23/19- AHI  54.2/ hr, desaturation to 64%, body weight 232 lbs   ===============================================   12/03/19- 33 yoM cigar smoker followed for OSA, complicated by Blind L eye (congenital), CAD/ angina, Diastolic Dysfunction, HTN, Fatty Liver, GERD, Hyperlipidemia, Raynaud's, Tobacco use, Diastolic Dysfunction/ edema,  Body weight today 232 lbs Download compliance 100%, AHI 7.3/ hr CPAP 10-20/ [employer] Full face mask isn't big enough for comfortable fit. Peripheral edema has decreased. He found statement on internet that CPAP tended to increase edema- discussed.  No longer sleepy in evenings.  12/02/20- 38 yoM cigar smoker followed for OSA, complicated by Blind L eye (congenital), CAD/ angina, Diastolic Dysfunction, HTN, Fatty Liver, GERD, Hyperlipidemia, Raynaud's, Tobacco use, Diastolic Dysfunction/ edema, CPAP auto 10-20/ [employer]    Ordered 09/30/19 Download-compliance 97%, AHI 3.4/ hr Body weight today- Covid vax-3 Phizer Flu vax-had -----Pt states he has been doing okay since last visit and denies any real complaints. Not interested in [place]. Now retired with flexible schedule and able to take occ nap.   ROS-see HPI   + = positive Constitutional:    weight loss, night sweats, fevers, chills, +fatigue, lassitude. HEENT:    headaches, difficulty swallowing, tooth/dental problems, sore throat,       sneezing, itching, ear ache, nasal congestion, post nasal drip, snoring CV:    chest pain, orthopnea, PND, +swelling in lower extremities, anasarca,                                   dizziness, palpitations Resp:   shortness of breath with exertion or at rest.                productive cough,   non-productive cough, coughing up of blood.              change in color of  mucus.  wheezing.   Skin:    rash or lesions. GI:  No-   heartburn, indigestion, abdominal pain, nausea, vomiting, diarrhea,                 change in bowel habits, loss of appetite GU: dysuria, change in color of urine, no urgency or frequency.   flank pain. MS:   joint pain, stiffness, decreased range of motion, back pain. Neuro-     nothing unusual Psych:  change in mood or affect.  depression or anxiety.   memory loss.  OBJ- Physical Exam General- Alert, Oriented, Affect-appropriate, Distress- none acute, + muscular Skin- rash-none, lesions- none, excoriation- none Lymphadenopathy- none Head- atraumatic            Eyes- + L strabismus            Ears- Hearing, canals-normal            Nose- Clear, no-Septal dev, mucus, polyps, erosion, perforation             Throat- Mallampati IV , mucosa clear , drainage- none, tonsils- atrophic Neck- flexible , trachea midline, no stridor , thyroid nl, carotid no bruit Chest - symmetrical excursion , unlabored           Heart/CV- RRR , no murmur , no gallop  , no rub, nl s1 s2                           -  JVD- none , edema +1-2 mid calf, stasis changes- none, varices- none           Lung- clear to P&A, wheeze- none, cough- none , dullness-none, rub- none           Chest wall-  Abd-  Br/ Gen/ Rectal- Not done, not indicated Extrem- cyanosis- none, clubbing, none, atrophy- none, strength- nl Neuro- grossly intact to observation

## 2020-12-02 ENCOUNTER — Encounter: Payer: Self-pay | Admitting: Internal Medicine

## 2020-12-02 ENCOUNTER — Other Ambulatory Visit: Payer: Self-pay

## 2020-12-02 ENCOUNTER — Ambulatory Visit (INDEPENDENT_AMBULATORY_CARE_PROVIDER_SITE_OTHER): Payer: 59 | Admitting: Internal Medicine

## 2020-12-02 VITALS — BP 128/74 | HR 70 | Temp 98.2°F | Ht 69.0 in | Wt 235.0 lb

## 2020-12-02 DIAGNOSIS — G4733 Obstructive sleep apnea (adult) (pediatric): Secondary | ICD-10-CM | POA: Diagnosis not present

## 2020-12-02 DIAGNOSIS — K219 Gastro-esophageal reflux disease without esophagitis: Secondary | ICD-10-CM

## 2020-12-02 DIAGNOSIS — Z9989 Dependence on other enabling machines and devices: Secondary | ICD-10-CM | POA: Diagnosis not present

## 2020-12-02 NOTE — Patient Instructions (Signed)
We can continue CPAP auto 10-20, mask of choice, humidifier, supplies, Airview/ card  We discussed travel machines like AirMini and Transcend. You can learn more from your [place] and places like [email or web address].  Please call if we can help

## 2020-12-05 ENCOUNTER — Other Ambulatory Visit: Payer: Self-pay | Admitting: Family Medicine

## 2020-12-15 ENCOUNTER — Telehealth: Payer: Self-pay | Admitting: Internal Medicine

## 2020-12-15 NOTE — Telephone Encounter (Signed)
Pt had CT at [facility] and Dr. Milford Cage wanted pt see asap due to finding of possible ileal/ cecal mass/carcinoid on CT scan. Pt scheduled to see Dr. Henrene Pastor tomorrow at 3pm, Wife aware of appt. Records to [place].

## 2020-12-15 NOTE — Telephone Encounter (Signed)
Left message for pts wife to call back. 

## 2020-12-16 ENCOUNTER — Encounter: Payer: Self-pay | Admitting: Internal Medicine

## 2020-12-16 ENCOUNTER — Ambulatory Visit (INDEPENDENT_AMBULATORY_CARE_PROVIDER_SITE_OTHER): Payer: 59 | Admitting: Internal Medicine

## 2020-12-16 VITALS — BP 142/66 | HR 84 | Ht 69.0 in | Wt 234.0 lb

## 2020-12-16 DIAGNOSIS — K6389 Other specified diseases of intestine: Secondary | ICD-10-CM

## 2020-12-16 DIAGNOSIS — R935 Abnormal findings on diagnostic imaging of other abdominal regions, including retroperitoneum: Secondary | ICD-10-CM | POA: Diagnosis not present

## 2020-12-16 MED ORDER — PLENVU 140 G PO SOLR: 1.0000 | Freq: Once | ORAL | 0 refills | Status: AC

## 2020-12-16 NOTE — Progress Notes (Signed)
HISTORY OF PRESENT ILLNESS:  Ryan Cafaro. is a 65 y.o. male, retired Engineer, building services, with multiple medical problems as listed below who was sent today by urology regarding abnormal CT scan.  Patient has been followed in this office previously for GERD and surveillance colonoscopy.  He has a mother with a history of colon cancer less than age 16.  Previous examinations 2005, 2013, and most recently January 2020.  3 polyps on most recent exam.  Follow-up in 5 years recommended.  He was recently seen by urology for kidney stones.  As part of his work-up he underwent a contrast-enhanced CT scan of the abdomen and pelvis December 01, 2020 he was said to have nodular enhancement in the area of the terminal ileum.  It was said to measure 1.9 x 1.5 cm without obstruction.  The appearance was suspicious for a small tumor such as carcinoid.  Further evaluation via endoscopy recommended.  There is also nodular enhancement just proximal to this region with possible ileal inflammation.  Patient denies change in bowel habits.  No bleeding.  No weight loss.  No anterior abdominal pain.  Appetite is good.  He is accompanied by his wife.  Blood work from February 2022 shows normal comprehensive metabolic panel.  CBC normal with hemoglobin 14.4  REVIEW OF SYSTEMS:  All non-GI ROS negative as otherwise stated in the HPI except for back pain, shortness of breath, hematuria  Past Medical History:  Diagnosis Date  . Abnormal liver function    history of   . Atypical chest pain   . Blind left eye    blind left eye since birth  . Cancer ([place])    skin:  basal cell carcinoma/ on face,back and neck  . Cataract    Bil  . Diastolic dysfunction    T.02/3219 Echo: EF 55-60%, Gr1 DD, triv MR.  . Fatty liver    fatty liver disease  . GERD (gastroesophageal reflux disease)   . History of indigestion   . Hyperlipidemia   . Hypertension   . Non-obstructive CAD (coronary artery disease)    a. 2016 Neg MV; b. 10/2017 Cath:  LM nl, LAD 106m, D1/2/3 nl, LCX 20p, RCA nl, EF 50-55%.  . SOB (shortness of breath) 2019   after climbing stairs    Past Surgical History:  Procedure Laterality Date  . CARDIAC CATHETERIZATION    . COLONOSCOPY    . LEFT HEART CATH AND CORONARY ANGIOGRAPHY Left 10/29/2017   Procedure: LEFT HEART CATH AND CORONARY ANGIOGRAPHY;  Surgeon: Nelva Bush, MD;  Location: [facility];  Service: Cardiovascular;  Laterality: Left;  . TONSILLECTOMY  1965   as a child    Social History Ryan Cromie.  reports that he has been smoking cigars. He has never used smokeless tobacco. He reports current alcohol use of about 2.0 standard drinks of alcohol per week. He reports that he does not use drugs.  family history includes Colon cancer in his mother; Diabetes in his mother and sister; Heart attack in his father; Heart disease in his father; Hypertension in his mother; Lung cancer in his father; Sinusitis in his sister.  Allergies  Allergen Reactions  . Atorvastatin Other (See Comments)    Hands swelled  . Tape     Caused redness       PHYSICAL EXAMINATION: Vital signs: BP (!) 142/66   Pulse 84   Ht 5\' 9"  (1.753 m)   Wt 234 lb (106.1 kg)  BMI 34.56 kg/m   Constitutional: generally well-appearing, no acute distress Psychiatric: alert and oriented x3, cooperative Eyes: extraocular movements intact, anicteric, conjunctiva pink Mouth: oral pharynx moist, no lesions Neck: supple no lymphadenopathy Cardiovascular: heart regular rate and rhythm, no murmur Lungs: clear to auscultation bilaterally Abdomen: soft, nontender, nondistended, no obvious ascites, no peritoneal signs, normal bowel sounds, no organomegaly Rectal: Deferred to colonoscopy Extremities: no clubbing, cyanosis, or lower extremity edema bilaterally Skin: no lesions on visible extremities Neuro: No focal deficits.  Cranial nerves intact  ASSESSMENT:  1.  Abnormal CT scan with possible small ileal mass.   Question carcinoid 2.  History of adenomatous colon polyps and family history of colon cancer.  Last colonoscopy January 2020 3.  GERD.  Normal EGD 2016 4.  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  Subsequently addressed by ENT   PLAN:  1.  Schedule colonoscopy with attempts at ileal intubation to investigate the area of concern.The nature of the procedure, as well as the risks, benefits, and alternatives were carefully and thoroughly reviewed with the patient. Ample time for discussion and questions allowed. The patient understood, was satisfied, and agreed to proceed. 2.  If this is not successful, consider capsule endoscopy 3.  We discussed the role of surgery 4.  Reflux precautions Total time of 40 minutes was spent preparing to see the patient, reviewing laboratory and x-rays, obtaining comprehensive history, performing medically appropriate physical examination, educating patient and his wife regarding the above listed issues, ordering advanced procedures, and documenting clinical information in the health record

## 2020-12-16 NOTE — Patient Instructions (Signed)
If you are age 63 or older, your body mass index should be between 23-30. Your Body mass index is 34.56 kg/m. If this is out of the aforementioned range listed, please consider follow up with your Primary Care Provider.  If you are age 2 or younger, your body mass index should be between 19-25. Your Body mass index is 34.56 kg/m. If this is out of the aformentioned range listed, please consider follow up with your Primary Care Provider.    You have been scheduled for a colonoscopy. Please follow written instructions given to you at your visit today.  Please pick up your prep supplies at the pharmacy within the next 1-3 days. If you use inhalers (even only as needed), please bring them with you on the day of your procedure.

## 2020-12-21 NOTE — Progress Notes (Signed)
HPI M cigar smoker followed for OSA, complicated by Blind L eye (congenital), CAD/ angina, Diastolic Dysfunction, HTN, Fatty Liver, GERD, Hyperlipidemia, Raynaud's, Tobacco use, Diastolic Dysfunction/ edema, HST- 09/23/19- AHI  54.2/ hr, desaturation to 64%, body weight 232 lbs   ===============================================   12/02/20- 16 yoM cigar smoker followed for OSA, complicated by Blind L eye (congenital), CAD/ angina, Diastolic Dysfunction, HTN, Fatty Liver, GERD, Hyperlipidemia, Raynaud's, Tobacco use, Diastolic Dysfunction/ edema, CPAP auto 10-20/ [employer]    Ordered 09/30/19 Download-compliance 97%, AHI 3.4/ hr Body weight today-235 lbs Covid vax-3 Phizer Flu vax-had -----Pt states he has been doing okay since last visit and denies any real complaints.  12/22/20- 52 yoM cigar smoker followed for OSA, Lung Nodule, complicated by Blind L eye (congenital), CAD/ angina, Diastolic Dysfunction, HTN, Fatty Liver, GERD, Hyperlipidemia, Raynaud's, Tobacco use, Diastolic Dysfunction/ edema, CPAP auto 10-20/ [employer]    Ordered 09/30/19 Body weight today- Download- compliance 97%, AHI 3.5/ hr -----Follow up after found spot on lung on CT. Had CT at [facility]- abdomen and pelvis 12/01/20. Findings include 6x5 mm RLL nodule Covid vax-3 Phizer Flu vax-had Kidney stone- will require lithotripsy. Incidental finding of small bowel mass with colonoscopy being scheduled. Incidental finding of 6x5 mmRLL nodule in visualized lung. He smoked 1-2 ppd cigs for a couple of years in his 78's. 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 Had PPD placed (result?) in grade school after classmate had exposure.No routine cough/ phlegm.   ROS-see HPI   + = positive Constitutional:    weight loss, night sweats, fevers, chills, +fatigue, lassitude. HEENT:    headaches, difficulty swallowing, tooth/dental problems, sore throat,       sneezing, itching, ear ache, nasal congestion, post nasal drip,  snoring CV:    chest pain, orthopnea, PND, +swelling in lower extremities, anasarca,                                   dizziness, palpitations Resp:   shortness of breath with exertion or at rest.                productive cough,   non-productive cough, coughing up of blood.              change in color of mucus.  wheezing.   Skin:    rash or lesions. GI:  No-   heartburn, indigestion, abdominal pain, nausea, vomiting, diarrhea,                 change in bowel habits, loss of appetite GU: dysuria, change in color of urine, no urgency or frequency.   flank pain. MS:   joint pain, stiffness, decreased range of motion, back pain. Neuro-     nothing unusual Psych:  change in mood or affect.  depression or anxiety.   memory loss.  OBJ- Physical Exam General- Alert, Oriented, Affect-appropriate, Distress- none acute Skin- rash-none, lesions- none, excoriation- none Lymphadenopathy- none Head- atraumatic            Eyes- + L strabismus, +blind L eye,             Ears- Hearing, canals-normal            Nose- Clear, no-Septal dev, mucus, polyps, erosion, perforation             Throat- Mallampati IV , mucosa clear , drainage- none, tonsils- atrophic Neck- flexible , trachea  midline, no stridor , thyroid nl, carotid no bruit Chest - symmetrical excursion , unlabored           Heart/CV- RRR , no murmur , no gallop  , no rub, nl s1 s2                           - JVD- none , edema +1, stasis changes- none, varices- none           Lung- clear to P&A, wheeze- none, cough- none , dullness-none, rub- none           Chest wall-  Abd-  Br/ Gen/ Rectal- Not done, not indicated Extrem- cyanosis- none, clubbing, none, atrophy- none, strength- nl Neuro- grossly intact to observation

## 2020-12-22 ENCOUNTER — Other Ambulatory Visit: Payer: Self-pay

## 2020-12-22 ENCOUNTER — Encounter: Payer: Self-pay | Admitting: Internal Medicine

## 2020-12-22 ENCOUNTER — Ambulatory Visit (INDEPENDENT_AMBULATORY_CARE_PROVIDER_SITE_OTHER): Payer: 59 | Admitting: Internal Medicine

## 2020-12-22 VITALS — BP 130/68 | HR 75 | Temp 98.3°F | Ht 69.0 in | Wt 234.0 lb

## 2020-12-22 DIAGNOSIS — Z9989 Dependence on other enabling machines and devices: Secondary | ICD-10-CM | POA: Diagnosis not present

## 2020-12-22 DIAGNOSIS — G4733 Obstructive sleep apnea (adult) (pediatric): Secondary | ICD-10-CM

## 2020-12-22 DIAGNOSIS — R911 Solitary pulmonary nodule: Secondary | ICD-10-CM

## 2020-12-22 DIAGNOSIS — Z201 Contact with and (suspected) exposure to tuberculosis: Secondary | ICD-10-CM | POA: Diagnosis not present

## 2020-12-22 LAB — BASIC METABOLIC PANEL
BUN: 17 mg/dL (ref 6–23)
CO2: 28 mEq/L (ref 19–32)
Calcium: 10.1 mg/dL (ref 8.4–10.5)
Chloride: 103 mEq/L (ref 96–112)
Creatinine, Ser: 1.16 mg/dL (ref 0.40–1.50)
GFR: 67.33 mL/min (ref >60.00)
Glucose, Bld: 95 mg/dL (ref 70–99)
Potassium: 4.3 mEq/L (ref 3.5–5.1)
Sodium: 137 mEq/L (ref 135–145)

## 2020-12-22 NOTE — Patient Instructions (Signed)
Order- schedule CTa chest  Dx lung nodule 6x5 mm RLL on [facility] CT  Order- lab- BMET, Quantiferon Plus Gold assay   Hx remote TB exposure  Keep current appointment. We will call results of CT.

## 2020-12-24 LAB — QUANTIFERON-TB GOLD PLUS
Mitogen-NIL: 9.21 IU/mL
NIL: 0.03 IU/mL
QuantiFERON-TB Gold Plus: NEGATIVE
TB1-NIL: <0 IU/mL
TB2-NIL: <0 IU/mL

## 2021-01-02 ENCOUNTER — Ambulatory Visit (INDEPENDENT_AMBULATORY_CARE_PROVIDER_SITE_OTHER)
Admission: RE | Admit: 2021-01-02 | Discharge: 2021-01-02 | Disposition: A | Discharge status: Home / Self Care | Payer: 59 | Source: Ambulatory Visit | Attending: Internal Medicine | Admitting: Internal Medicine

## 2021-01-02 ENCOUNTER — Telehealth: Payer: Self-pay | Admitting: Internal Medicine

## 2021-01-02 ENCOUNTER — Other Ambulatory Visit: Payer: Self-pay

## 2021-01-02 DIAGNOSIS — R911 Solitary pulmonary nodule: Secondary | ICD-10-CM

## 2021-01-02 NOTE — Telephone Encounter (Signed)
CT scan again shows a small nodule in the right lung.  We will look at it again in a year There is also atherosclerosis, with calcium deposits in arteries.   Order- future CT chest, no contrast, 1 year  dx R lung nodule  Called and spoke with pt and he is aware of results per CY.  Pt voiced his understanding.

## 2021-01-17 NOTE — Assessment & Plan Note (Signed)
Continues reflux precautions

## 2021-01-17 NOTE — Assessment & Plan Note (Signed)
Benefits from CPAP with good cocmpliance and control Plan- continue auto 10-20

## 2021-01-25 HISTORY — PX: COLONOSCOPY: SHX174

## 2021-01-26 ENCOUNTER — Other Ambulatory Visit: Payer: Self-pay

## 2021-01-26 ENCOUNTER — Encounter: Payer: Self-pay | Admitting: Physician Assistant

## 2021-01-26 ENCOUNTER — Ambulatory Visit (INDEPENDENT_AMBULATORY_CARE_PROVIDER_SITE_OTHER): Payer: 59 | Admitting: Physician Assistant

## 2021-01-26 VITALS — BP 134/78 | HR 64 | Ht 69.0 in | Wt 235.0 lb

## 2021-01-26 DIAGNOSIS — I5032 Chronic diastolic (congestive) heart failure: Secondary | ICD-10-CM | POA: Diagnosis not present

## 2021-01-26 DIAGNOSIS — I10 Essential (primary) hypertension: Secondary | ICD-10-CM

## 2021-01-26 DIAGNOSIS — G4733 Obstructive sleep apnea (adult) (pediatric): Secondary | ICD-10-CM

## 2021-01-26 DIAGNOSIS — E785 Hyperlipidemia, unspecified: Secondary | ICD-10-CM

## 2021-01-26 DIAGNOSIS — I2510 Atherosclerotic heart disease of native coronary artery without angina pectoris: Secondary | ICD-10-CM | POA: Diagnosis not present

## 2021-01-26 DIAGNOSIS — Z9989 Dependence on other enabling machines and devices: Secondary | ICD-10-CM

## 2021-01-26 DIAGNOSIS — Z789 Other specified health status: Secondary | ICD-10-CM

## 2021-01-26 NOTE — Progress Notes (Signed)
Office Visit    Patient Name: Ryan [name]. Date of Encounter: 01/26/2021  PCP:  Debbrah Alar, NP   [place]  Cardiologist:  Nelva Bush, MD  Advanced Practice Provider:  No care team member to display Electrophysiologist:  None   Chief Complaint    Chief Complaint  Patient presents with  . Follow-up    Annual follow up. Medications verbally reviewed with patient.     65 y.o. male with history of nonobstructive CAD, chronic HFpEF, hypertension, hyperlipidemia, obstructive sleep apnea, GERD, hepatic steatosis, and who presents today for 1 year follow-up of HFpEF.  Past Medical History    Past Medical History:  Diagnosis Date  . Abnormal liver function    history of   . Atypical chest pain   . Blind left eye    blind left eye since birth  . Cancer ([place])    skin:  basal cell carcinoma/ on face,back and neck  . Cataract    Bil  . Diastolic dysfunction    V.10/7104 Echo: EF 55-60%, Gr1 DD, triv MR.  . Fatty liver    fatty liver disease  . GERD (gastroesophageal reflux disease)   . History of indigestion   . Hyperlipidemia   . Hypertension   . Non-obstructive CAD (coronary artery disease)    a. 2016 Neg MV; b. 10/2017 Cath: LM nl, LAD 38m, D1/2/3 nl, LCX 20p, RCA nl, EF 50-55%.  . SOB (shortness of breath) 2019   after climbing stairs   Past Surgical History:  Procedure Laterality Date  . CARDIAC CATHETERIZATION    . COLONOSCOPY    . LEFT HEART CATH AND CORONARY ANGIOGRAPHY Left 10/29/2017   Procedure: LEFT HEART CATH AND CORONARY ANGIOGRAPHY;  Surgeon: Nelva Bush, MD;  Location: [facility];  Service: Cardiovascular;  Laterality: Left;  . TONSILLECTOMY  1965   as a child    Allergies  Allergies  Allergen Reactions  . Atorvastatin Other (See Comments)    Hands swelled  . Tape     Caused redness    History of Present Illness    Ryan [name]. is a 65 y.o. male with PMH as above.  He has history  of nonobstructive CAD, chronic HFpEF, hypertension, hyperlipidemia, obstructive sleep apnea, GERD, and hepatic steatosis.    When seen in office March 2021, he was doing well with BP elevated at times.  He had just started using his CPAP after diagnosis of severe obstructive sleep apnea.  He was seen at follow-up 12/31/2019 and tolerating his CPAP well.  He reported leg edema, attributing it to the CPAP, as they occurred at the same time.  He reported improvement in swelling with furosemide but not complete relief of lower extremity edema.  He was planning to retire in the next 2 months.  Recommendation was for furosemide 20 mg daily as needed for weight gain and swelling.  He was continued on ASA and statin.  Today, 01/26/2021, he returns to clinic and notes that he is overall doing well from a cardiac standpoint.  He reports that he has not needed his fluid pill in many months and his volume status has been well controlled.  Of note, his Lasix is not included on today's prescription list, though he does reference having this medication, and suspected has fallen off of his list as he has not taken it for some time.  He does report his desire to avoid medications if possible.  He is trying to eat healthy and avoid salt, though he does enjoy country ham, hotdogs, and hamburgers on occasion.  Also reports enjoying fudge.  He reports rare alcohol use.  [place] any symptoms concerning for angina.  He cuts wood, mows grass, and weeds.  He also reports riding his exercise bike for at least 10 minutes/day.  He denies any chest pain or shortness of breath at rest or with activity.  He is monitoring his blood pressure at home and reports average SBP 130 with range 110-158 and average DBP 65-110.  BP today 134/78 with recommendation for BP 130/80 or lower.  Weight today 235 pounds and slightly elevated from that of previous.  He reports that when his blood pressure is elevated, he can feel  his chest beating.  His weight has ranged from 229-230.  His weight at his lowest was 226 pounds.  He reports a desire to lose 10 pounds.  He reports his breathing is stable and he continues to use his CPAP.  He has an upcoming colonoscopy 02/15/2021.  He does report some BRBPR, attributed to his GI issues and kidney stone.  We reviewed his most recent cholesterol with patient report that he had myalgias previously on his statin.  He is no longer taking his statin, and it has fallen off of his medication list, which was discussed at length today.  He reports previous labs from his PCP, and he states that these will be sent over to our office.  Home Medications   Current Outpatient Medications  Medication Instructions  . aspirin EC 81 mg, Oral, Daily  . COVID-19 mRNA vaccine, Pfizer, 30 MCG/0.3ML injection INJECT AS DIRECTED  . Fish Oil 4,800 mg, Oral, Daily  . losartan (COZAAR) 25 MG tablet TAKE 1 TABLET BY MOUTH DAILY.  Marland Kitchen psyllium (METAMUCIL) 58.6 % powder 1 packet, Oral, As needed     Review of Systems    He denies chest pain, dyspnea, pnd, orthopnea, n, v, dizziness, syncope, edema,  or early satiety.  He does report that he feels his heart pounding if his blood pressure is elevated.  He reports some BRBPR related to GI issues/kidney stone.  He reports some weight gain attributed to dietary indiscretion..   All other systems reviewed and are otherwise negative except as noted above.  Physical Exam    VS:  BP 134/78 (BP Location: Left Arm, Patient Position: Sitting, Cuff Size: Normal)   Pulse 64   Ht 5\' 9"  (1.753 m)   Wt 235 lb (106.6 kg)   SpO2 97%   BMI 34.70 kg/m  , BMI There is no height or weight on file to calculate BMI. GEN: Well nourished, well developed, in no acute distress. HEENT: normal. Neck: Supple, no JVD, carotid bruits, or masses. Cardiac: RRR, no murmurs, rubs, or gallops. No clubbing, cyanosis.  Lower extremity edema 1+ bilaterally.  Radials/DP/PT 2+ and equal  bilaterally.  Respiratory:  Respirations regular and unlabored, clear to auscultation bilaterally. GI: Soft, nontender, nondistended, BS + x 4. MS: no deformity or atrophy. Skin: warm and dry, no rash. Neuro:  Strength and sensation are intact. Psych: Normal affect.  Accessory Clinical Findings    ECG personally reviewed by me today - NSR, 64 bpm, no acute ST/T changes- no acute changes.  VITALS Reviewed today   Temp Readings from Last 3 Encounters:  12/22/20 98.3 F (36.8 C) (Skin)  12/02/20 98.2 F (36.8 C) (Temporal)  09/30/20 98.3 F (  36.8 C) (Oral)   BP Readings from Last 3 Encounters:  12/22/20 130/68  12/16/20 (!) 142/66  12/02/20 128/74   Pulse Readings from Last 3 Encounters:  12/22/20 75  12/16/20 84  12/02/20 70    Wt Readings from Last 3 Encounters:  12/22/20 234 lb (106.1 kg)  12/16/20 234 lb (106.1 kg)  12/02/20 235 lb (106.6 kg)     LABS  reviewed today   Lab Results  Component Value Date   WBC 5.1 09/30/2020   HGB 14.4 09/30/2020   HCT 41.5 09/30/2020   MCV 87.9 09/30/2020   PLT 186.0 09/30/2020   Lab Results  Component Value Date   CREATININE 1.16 12/22/2020   BUN 17 12/22/2020   NA 137 12/22/2020   K 4.3 12/22/2020   CL 103 12/22/2020   CO2 28 12/22/2020   Lab Results  Component Value Date   ALT 24 10/13/2020   AST 22 10/13/2020   ALKPHOS 70 10/13/2020   BILITOT 0.7 10/13/2020   Lab Results  Component Value Date   CHOL 143 08/26/2019   HDL 58.10 08/26/2019   LDLCALC 72 08/26/2019   LDLDIRECT 144.3 11/17/2007   TRIG 64.0 08/26/2019   CHOLHDL 2 08/26/2019    No results found for: HGBA1C Lab Results  Component Value Date   TSH 1.81 06/29/2020     STUDIES/PROCEDURES reviewed today   Echo 11/05/19 1. Left ventricular ejection fraction, by estimation, is 60 to 65%. The  left ventricle has normal function. The left ventricle has no regional  wall motion abnormalities. Left ventricular diastolic parameters are  consistent  with Grade II diastolic  dysfunction (pseudonormalization).  2. Right ventricular systolic function is normal. The right ventricular  size is normal. Tricuspid regurgitation signal is inadequate for assessing  PA pressure.  3. Left atrial size was mildly dilated.  4. Mild mitral valve regurgitation.   10/29/17 [facility] Conclusions: 1. Mild to moderate coronary artery disease, with long 50% stenosis of the mid/distal LAD and 20% ostial LCx disease. 2. Low normal left ventricular contraction (LVEF 50-55%). 3. Upper normal left ventricular filling pressure (LVEDP 15 mmHg). Left Heart  Left Ventricle The left ventricular size is normal. The left ventricular systolic function is normal. LV end diastolic pressure is normal. LVEDP ~15 mmHg. The left ventricular ejection fraction is 50-55% by visual estimate.  Aortic Valve There is no aortic valve stenosis.    Coronary Diagrams   Diagnostic Dominance: Right      Assessment & Plan    Essential hypertension, goal BP 130/80 or lower - BP today suboptimal with patient report that this elevated BP is due to recent dietary indiscretion with country ham/hotdogs and as outlined above.  He is agreeable to continue to monitor his BP at home and call the office if consistently 130/80 or greater.  If BP still elevated at RTC, recommend escalation of losartan to losartan 50 mg daily at that time.  Reviewed recommendations for total daily fluids under 2 L/day and total salt under 2 g/day with patient understanding.  He has not taken his Lasix in several months as outlined above and prefers to avoid medications if possible. Restart of medications discussed at length today, as well as the recommendation for aggressive risk factor modification through lifestyle changes and medical management.  Chronic HFpEF -- He reports improvement in edema and that he has not needed his furosemide for many months -furosemide has also fallen off of his medication list.  He  reports that he  does not like to take many pills.  Salt and fluid restrictions reviewed as above.  Aggressive risk factor modification with lifestyle changes and restart of medical management discussed at length.  Recommend ongoing Lasix as needed for swelling and BP / volume control.  Nonobstructive coronary artery disease -- No chest pain or shortness of breath at rest or with his regular activity.  Continue ASA.  His statin has fallen off of his list with patient report that he has myalgias with statins.  We briefly discussed PCSK9 inhibitors with patient preference to defer/patient politely declining this medication.  His PCP will send over his cholesterol labs. Labs via our office declined. If LDL remains uncontrolled, continue to recommend Repatha or Praluent at that time.  We did discuss the recommendation for risk factor modification through cholesterol control and lifestyle changes. Medical management for cholesterol recommended.  OSA -- Ongoing compliance with the CPAP recommended.  Medication changes: Declined. None.  Labs ordered: Declined.  PCP will forward his recent labs  Studies / Imaging ordered: None Future considerations: Increase losartan if BP still suboptimal at RTC.  Reassess if taking Lasix as needed and statin at RTC and pending cholesterol labs. Disposition: RTC 6 months  *Please be aware that the above documentation was completed voice recognition software and may contain dictation errors.      Arvil Chaco, PA-C 01/26/2021

## 2021-01-26 NOTE — Patient Instructions (Signed)
Medication Instructions:  Your physician recommends that you continue on your current medications as directed. Please refer to the Current Medication list given to you today.  *If you need a refill on your cardiac medications before your next appointment, please call your pharmacy*   Lab Work:  None ordered  Testing/Procedures:  None ordered   Follow-Up: At [facility], you and your health needs are our priority.  As part of our continuing mission to provide you with exceptional heart care, we have created designated Provider Care Teams.  These Care Teams include your primary Cardiologist (physician) and Advanced Practice Providers (APPs -  Physician Assistants and Nurse Practitioners) who all work together to provide you with the care you need, when you need it.  We recommend signing up for the patient portal called "MyChart".  Sign up information is provided on this After Visit Summary.  MyChart is used to connect with patients for Virtual Visits (Telemedicine).  Patients are able to view lab/test results, encounter notes, upcoming appointments, etc.  Non-urgent messages can be sent to your provider as well.   To learn more about what you can do with MyChart, go to [email or web address].com.    Your next appointment:   6 month(s)  The format for your next appointment:   In Person  Provider:   You may see Christopher End, MD or one of the following Advanced Practice Providers on your designated Care Team:   Christopher Berge, NP Ryan Dunn, PA-C Jacquelyn Visser, PA-C Cadence Furth, PA-C Caitlin Walker, NP   

## 2021-02-08 ENCOUNTER — Ambulatory Visit: Payer: 59 | Admitting: Internal Medicine

## 2021-02-15 ENCOUNTER — Other Ambulatory Visit: Payer: Self-pay

## 2021-02-15 ENCOUNTER — Ambulatory Visit (AMBULATORY_SURGERY_CENTER): Payer: 59 | Admitting: Internal Medicine

## 2021-02-15 ENCOUNTER — Encounter: Payer: Self-pay | Admitting: Internal Medicine

## 2021-02-15 VITALS — BP 129/77 | HR 67 | Temp 98.2°F | Resp 11 | Ht 69.0 in | Wt 234.0 lb

## 2021-02-15 DIAGNOSIS — D1339 Benign neoplasm of other parts of small intestine: Secondary | ICD-10-CM

## 2021-02-15 DIAGNOSIS — R935 Abnormal findings on diagnostic imaging of other abdominal regions, including retroperitoneum: Secondary | ICD-10-CM

## 2021-02-15 DIAGNOSIS — K6389 Other specified diseases of intestine: Secondary | ICD-10-CM

## 2021-02-15 DIAGNOSIS — D3A8 Other benign neuroendocrine tumors: Secondary | ICD-10-CM

## 2021-02-15 DIAGNOSIS — C7A012 Malignant carcinoid tumor of the ileum: Secondary | ICD-10-CM

## 2021-02-15 MED ORDER — SODIUM CHLORIDE 0.9 % IV SOLN: 500.0000 mL | INTRAVENOUS | Status: DC

## 2021-02-15 NOTE — Progress Notes (Signed)
pt tolerated well. VSS. awake and to recovery. Report given to RN.  

## 2021-02-15 NOTE — Patient Instructions (Signed)
The office will call you to schedule an appointment with the surgical center.  Please, read all of the handouts given to you by your recovery room nurse.  YOU HAD AN ENDOSCOPIC PROCEDURE TODAY AT [facility]:   Refer to the procedure report that was given to you for any specific questions about what was found during the examination.  If the procedure report does not answer your questions, please call your gastroenterologist to clarify.  If you requested that your care partner not be given the details of your procedure findings, then the procedure report has been included in a sealed envelope for you to review at your convenience later.  YOU SHOULD EXPECT: Some feelings of bloating in the abdomen. Passage of more gas than usual.  Walking can help get rid of the air that was put into your GI tract during the procedure and reduce the bloating. If you had a lower endoscopy (such as a colonoscopy or flexible sigmoidoscopy) you may notice spotting of blood in your stool or on the toilet paper. If you underwent a bowel prep for your procedure, you may not have a normal bowel movement for a few days.  Please Note:  You might notice some irritation and congestion in your nose or some drainage.  This is from the oxygen used during your procedure.  There is no need for concern and it should clear up in a day or so.  SYMPTOMS TO REPORT IMMEDIATELY:  Following lower endoscopy (colonoscopy or flexible sigmoidoscopy):  Excessive amounts of blood in the stool  Significant tenderness or worsening of abdominal pains  Swelling of the abdomen that is new, acute  Fever of 100F or higher   For urgent or emergent issues, a gastroenterologist can be reached at any hour by calling [phone]. Do not use MyChart messaging for urgent concerns.    DIET:  We do recommend a small meal at first, but then you may proceed to your regular diet.  Drink plenty of fluids but you should avoid alcoholic  beverages for 24 hours.  ACTIVITY:  You should plan to take it easy for the rest of today and you should NOT DRIVE or use heavy machinery until tomorrow (because of the sedation medicines used during the test).    FOLLOW UP: Our staff will call the number listed on your records 48-72 hours following your procedure to check on you and address any questions or concerns that you may have regarding the information given to you following your procedure. If we do not reach you, we will leave a message.  We will attempt to reach you two times.  During this call, we will ask if you have developed any symptoms of COVID 19. If you develop any symptoms (ie: fever, flu-like symptoms, shortness of breath, cough etc.) before then, please call [phone].  If you test positive for Covid 19 in the 2 weeks post procedure, please call and report this information to [place].    If any biopsies were taken you will be contacted by phone or by letter within the next 1-3 weeks.  Please call us at [phone] if you have not heard about the biopsies in 3 weeks.    SIGNATURES/CONFIDENTIALITY: You and/or your care partner have signed paperwork which will be entered into your electronic medical record.  These signatures attest to the fact that that the information above on your After Visit Summary has been reviewed and is understood.  Full responsibility of the confidentiality  of this discharge information lies with you and/or your care-partner.  

## 2021-02-15 NOTE — Progress Notes (Signed)
Called to room to assist during endoscopic procedure.  Patient ID and intended procedure confirmed with present staff. Received instructions for my participation in the procedure from the performing physician.  

## 2021-02-15 NOTE — Op Note (Signed)
[facility] Patient Name: Ryan [name] Procedure Date: 02/15/2021 2:58 PM MRN: [id number] Endoscopist: Docia Chuck. Henrene Pastor , MD Age: 65 Referring MD:  Date of Birth: 01-Jan-1956 Gender: Male Account #: [id number] Procedure:                Colonoscopy biopsies Indications:              Abnormal CT of the GI tract. 2 cm nodule/mass in                            the terminal ileum. Patient has had prior                            colonoscopy for family history of colon cancer.                            2005, 2013, 2020. SSP on last exam Medicines:                Monitored Anesthesia Care Procedure:                Pre-Anesthesia Assessment:                           - Prior to the procedure, a History and Physical                            was performed, and patient medications and                            allergies were reviewed. The patient's tolerance of                            previous anesthesia was also reviewed. The risks                            and benefits of the procedure and the sedation                            options and risks were discussed with the patient.                            All questions were answered, and informed consent                            was obtained. Prior Anticoagulants: The patient has                            taken no previous anticoagulant or antiplatelet                            agents. ASA Grade Assessment: II - A patient with                            mild systemic disease. After reviewing the risks  and benefits, the patient was deemed in                            satisfactory condition to undergo the procedure.                           After obtaining informed consent, the colonoscope                            was passed under direct vision. Throughout the                            procedure, the patient's blood pressure, pulse, and                            oxygen saturations were monitored  continuously. The                            Olympus [id number]-[id number]) Colonoscope was                            introduced through the anus and advanced to the the                            cecum, identified by appendiceal orifice and                            ileocecal valve. The ileocecal valve, appendiceal                            orifice, and rectum were photographed. The quality                            of the bowel preparation was excellent. The                            colonoscopy was performed without difficulty. The                            patient tolerated the procedure well. The bowel                            preparation used was SUPREP via split dose                            instruction. Scope In: 3:14:10 PM Scope Out: 3:30:26 PM Scope Withdrawal Time: 0 hours 13 minutes 17 seconds  Total Procedure Duration: 0 hours 16 minutes 16 seconds  Findings:                 The terminal ileum contained a submucosal partially                            obstructing mass. In addition, its diameter  measured 20 mm. No bleeding was present. This was                            biopsied with a cold forceps for histology. The                            lesion was located about 2 or 3 cm proximal to the                            ileocecal valve. The endoscope was passed proximal                            to the lesion with normal-appearing ileal mucosa.                           The exam was otherwise without abnormality on                            direct and retroflexion views. Complications:            No immediate complications. Estimated blood loss:                            None. Estimated Blood Loss:     Estimated blood loss: none. Impression:               - Partially obstructing 2 cm submucosal tumor in                            the terminal ileum. Biopsied. Most likely carcinoid                            tumor.                            - The examination was otherwise normal on direct                            and retroflexion views. Recommendation:           - Repeat colonoscopy in 5 years for surveillance                            (history of SSP).                           - Patient has a contact number available for                            emergencies. The signs and symptoms of potential                            delayed complications were discussed with the                            patient. Return  to normal activities tomorrow.                            Written discharge instructions were provided to the                            patient.                           - Resume previous diet.                           - Continue present medications.                           - Await pathology results.                           - Refer to [facility] surgery (general                            surgery) for resection of ileal mass. Docia Chuck. Henrene Pastor, MD 02/15/2021 3:40:35 PM This report has been signed electronically.

## 2021-02-16 ENCOUNTER — Telehealth: Payer: Self-pay

## 2021-02-16 NOTE — Telephone Encounter (Signed)
Referral faxed to [facility] for resection of ileal mass

## 2021-02-17 ENCOUNTER — Telehealth: Payer: Self-pay

## 2021-02-17 NOTE — Telephone Encounter (Signed)
  Follow up Call-  Call back number 02/15/2021 09/05/2018  Post procedure Call Back phone  # [phone]  Permission to leave phone message Yes Yes  Some recent data might be hidden     Patient questions:  Do you have a fever, pain , or abdominal swelling? No. Pain Score  0 *  Have you tolerated food without any problems? Yes.    Have you been able to return to your normal activities? Yes.    Do you have any questions about your discharge instructions: Diet   No. Medications  No. Follow up visit  No.  Do you have questions or concerns about your Care? No.  Actions: * If pain score is 4 or above: No action needed, pain <4.  Have you developed a fever since your procedure? no  2.   Have you had an respiratory symptoms (SOB or cough) since your procedure? no  3.   Have you tested positive for COVID 19 since your procedure no  4.   Have you had any family members/close contacts diagnosed with the COVID 19 since your procedure?  no   If yes to any of these questions please route to Joylene Marin, RN and Joella Prince, RN

## 2021-02-28 ENCOUNTER — Encounter: Payer: Self-pay | Admitting: Internal Medicine

## 2021-03-10 ENCOUNTER — Other Ambulatory Visit: Payer: Self-pay | Admitting: Internal Medicine

## 2021-03-29 ENCOUNTER — Ambulatory Visit: Payer: Self-pay | Admitting: Surgery

## 2021-03-31 ENCOUNTER — Telehealth: Payer: Self-pay | Admitting: Internal Medicine

## 2021-03-31 NOTE — Telephone Encounter (Signed)
    Patient Name: Ryan [name].  DOB: 11-03-1955 MRN: [id number]  Primary Cardiologist: Nelva Bush, MD  Chart reviewed as part of pre-operative protocol coverage. Given past medical history and time since last visit, based on ACC/AHA guidelines, [name]. would be at acceptable risk for the planned procedure without further cardiovascular testing.   The patient was recently seen in follow up at which time he was doing well from a CV standpoint with no chest pain or SOB. He is very active at baseline and can achieve 4METS or greater without CV symptoms. He has no prior hx of CAD.   The patient was advised that if he develops new symptoms prior to surgery to contact our office to arrange for a follow-up visit, and he verbalized understanding.  I will route this recommendation to the requesting party via Epic fax function and remove from pre-op pool.  Please call with questions.  Kathyrn Drown, NP 03/31/2021, 4:00 PM

## 2021-03-31 NOTE — Telephone Encounter (Signed)
   [facility] Pre-operative Risk Assessment    Patient Name: Ryan [name].  DOB: [birth year] MRN: [id number]  HEARTCARE STAFF:  - IMPORTANT!!!!!! Under Visit Info/Reason for Call, type in Other and utilize the format Clearance MM/DD/YY or Clearance TBD. Do not use dashes or single digits. - Please review there is not already an duplicate clearance open for this procedure. - If request is for dental extraction, please clarify the # of teeth to be extracted. - If the patient is currently at the dentist's office, call Pre-Op Callback Staff (MA/nurse) to input urgent request.  - If the patient is not currently in the dentist office, please route to the Pre-Op pool.  Request for surgical clearance:  What type of surgery is being performed? ROBOTIC COLECTOMY  When is this surgery scheduled? TBD  What type of clearance is required (medical clearance vs. Pharmacy clearance to hold med vs. Both)? NOT LISTED  Are there any medications that need to be held prior to surgery and how long? NOT LISTED  Practice name and name of physician performing surgery? [facility].  What is the office phone number? [phone]   7.   What is the office fax number? [phone] 8.   Anesthesia type (None, local, MAC, general) ? GENERAL   Eli Phillips 03/31/2021, 3:34 PM  _________________________________________________________________   (provider comments below)

## 2021-04-07 ENCOUNTER — Encounter: Payer: Self-pay | Admitting: Internal Medicine

## 2021-04-07 DIAGNOSIS — R911 Solitary pulmonary nodule: Secondary | ICD-10-CM | POA: Insufficient documentation

## 2021-04-07 NOTE — Assessment & Plan Note (Signed)
Plan- Quantiferon TB Gold, CT chest

## 2021-04-07 NOTE — Assessment & Plan Note (Signed)
Benefits from CPAP with good compliance and control Plan- continue autopap 10-20

## 2021-04-21 NOTE — Patient Instructions (Addendum)
DUE TO COVID-19 ONLY ONE VISITOR IS ALLOWED TO COME WITH YOU AND STAY IN THE WAITING ROOM ONLY DURING PRE OP AND PROCEDURE.   **NO VISITORS ARE ALLOWED IN THE SHORT STAY AREA OR RECOVERY ROOM!!**  IF YOU WILL BE ADMITTED INTO THE HOSPITAL YOU ARE ALLOWED ONLY TWO SUPPORT PEOPLE DURING VISITATION HOURS ONLY (10AM -8PM)   The support person(s) may change daily. The support person(s) must pass our screening, gel in and out, and wear a mask at all times, including in the patient's room. Patients must also wear a mask when staff or their support person are in the room.  No visitors under the age of 11. Any visitor under the age of 21 must be accompanied by an adult.   COVID SWAB TESTING MUST BE COMPLETED ON:  Monday, 05-08-21 Between the hours of 8 and 3  **MUST PRESENT COMPLETED FORM AT TESTING SITE**    [place]  (backside of the building)  You are not required to quarantine, however you are required to wear a well-fitted mask when you are out and around people not in your household.  Hand Hygiene often Do NOT share personal items Notify your provider if you are in close contact with someone who has COVID or you develop fever 100.4 or greater, new onset of sneezing, cough, sore throat, shortness of breath or body aches.         Your procedure is scheduled on:  Wednesday, 05-10-21   Report to [facility] Main  Entrance   Report to admitting at 6:15 AM   Call this number if you have problems the morning of surgery [phone]   Follow prep instructions from surgeon's office   Follow a clear liquid diet day of prep to prevent dehydration   Do not eat food :After Midnight.   May have liquids until 5:30 AM day of surgery  CLEAR LIQUID DIET  Foods Allowed                                                                     Foods Excluded  Water, Black Coffee (no milk/no creamer) and tea, regular and decaf                              liquids  that you cannot  Plain Jell-O in any flavor  (No red)                         see through such as: Fruit ices (not with fruit pulp)                                 milk, soups, orange juice  Iced Popsicles (No red)                                    All solid food  Apple juices Sports drinks like Gatorade (No red) Lightly seasoned clear broth or consume(fat free) Sugar Sample Menu Breakfast                                Lunch                                     Supper Cranberry juice                    Beef broth                            Chicken broth Jell-O                                     Grape juice                           Apple juice Coffee or tea                        Jell-O                                      Popsicle                                                Coffee or tea                        Coffee or tea       Drink 2 Ensure drinks the night before surgery. Have completed by 10 PM  Complete one Ensure drink the morning of surgery 3 hours prior to scheduled surgery, have completed by 5:30 AM.     The day of surgery:  Drink ONE (1) Pre-Surgery Clear Ensure or G2 by am the morning of surgery. Drink in one sitting. Do not sip.  This drink was given to you during your hospital  pre-op appointment visit. Nothing else to drink after completing the  Pre-Surgery Clear Ensure or G2.          If you have questions, please contact your surgeon's office.     Oral Hygiene is also important to reduce your risk of infection.                                    Remember - BRUSH YOUR TEETH THE MORNING OF SURGERY WITH YOUR REGULAR TOOTHPASTE   Do NOT smoke after Midnight   Take these medicines the morning of surgery with A SIP OF WATER: None                     ASA 81 mg.  Patient to check to see if he needs to stop   Bring CPAP mask and tubing day of surgery  You may not have any metal on your body including jewelry, and body  piercing             Do not wear lotions, powders, cologne, or deodorant               Men may shave face and neck.   Do not bring valuables to the hospital. [place].   Contacts, dentures or bridgework may not be worn into surgery.   Bring small overnight bag day of surgery.    Patients discharged the day of surgery will not be allowed to drive home.   Please read over the following fact sheets you were given: IF YOU HAVE QUESTIONS ABOUT YOUR PRE OP INSTRUCTIONS PLEASE CALL [place] - Preparing for Surgery Before surgery, you can play an important role.  Because skin is not sterile, your skin needs to be as free of germs as possible.  You can reduce the number of germs on your skin by washing with CHG (chlorahexidine gluconate) soap before surgery.  CHG is an antiseptic cleaner which kills germs and bonds with the skin to continue killing germs even after washing. Please DO NOT use if you have an allergy to CHG or antibacterial soaps.  If your skin becomes reddened/irritated stop using the CHG and inform your nurse when you arrive at Short Stay. Do not shave (including legs and underarms) for at least 48 hours prior to the first CHG shower.  You may shave your face/neck. Please follow these instructions carefully:  1.  Shower with CHG Soap the night before surgery and the  morning of Surgery.  2.  If you choose to wash your hair, wash your hair first as usual with your  normal  shampoo.  3.  After you shampoo, rinse your hair and body thoroughly to remove the  shampoo.                           4.  Use CHG as you would any other liquid soap.  You can apply chg directly  to the skin and wash                       Gently with a scrungie or clean washcloth.  5.  Apply the CHG Soap to your body ONLY FROM THE NECK DOWN.   Do not use on face/ open                           Wound or open sores. Avoid contact with eyes,  ears mouth and genitals (private parts).                       Wash face,  Genitals (private parts) with your normal soap.             6.  Wash thoroughly, paying special attention to the area where your surgery  will be performed.  7.  Thoroughly rinse your body with warm water from the neck down.  8.  DO NOT shower/wash with your normal soap after using and rinsing off  the CHG Soap.                9.  Pat yourself dry with a clean towel.  10.  Wear clean pajamas.            11.  Place clean sheets on your bed the night of your first shower and do not  sleep with pets. Day of Surgery : Do not apply any lotions/deodorants the morning of surgery.  Please wear clean clothes to the hospital/surgery center.  FAILURE TO FOLLOW THESE INSTRUCTIONS MAY RESULT IN THE CANCELLATION OF YOUR SURGERY PATIENT SIGNATURE_________________________________  NURSE SIGNATURE__________________________________  __  Incentive Spirometer  An incentive spirometer is a tool that can help keep your lungs clear and active. This tool measures how well you are filling your lungs with each breath. Taking long deep breaths may help reverse or decrease the chance of developing breathing (pulmonary) problems (especially infection) following: A long period of time when you are unable to move or be active. BEFORE THE PROCEDURE  If the spirometer includes an indicator to show your best effort, your nurse or respiratory therapist will set it to a desired goal. If possible, sit up straight or lean slightly forward. Try not to slouch. Hold the incentive spirometer in an upright position. INSTRUCTIONS FOR USE  Sit on the edge of your bed if possible, or sit up as far as you can in bed or on a chair. Hold the incentive spirometer in an upright position. Breathe out normally. Place the mouthpiece in your mouth and seal your lips tightly around it. Breathe in slowly and as deeply as possible, raising the piston or the  ball toward the top of the column. Hold your breath for 3-5 seconds or for as long as possible. Allow the piston or ball to fall to the bottom of the column. Remove the mouthpiece from your mouth and breathe out normally. Rest for a few seconds and repeat Steps 1 through 7 at least 10 times every 1-2 hours when you are awake. Take your time and take a few normal breaths between deep breaths. The spirometer may include an indicator to show your best effort. Use the indicator as a goal to work toward during each repetition. After each set of 10 deep breaths, practice coughing to be sure your lungs are clear. If you have an incision (the cut made at the time of surgery), support your incision when coughing by placing a pillow or rolled up towels firmly against it. Once you are able to get out of bed, walk around indoors and cough well. You may stop using the incentive spirometer when instructed by your caregiver.  RISKS AND COMPLICATIONS Take your time so you do not get dizzy or light-headed. If you are in pain, you may need to take or ask for pain medication before doing incentive spirometry. It is harder to take a deep breath if you are having pain. AFTER USE Rest and breathe slowly and easily. It can be helpful to keep track of a log of your progress. Your caregiver can provide you with a simple table to help with this. If you are using the spirometer at home, follow these instructions: [place] IF:  You are having difficultly using the spirometer. You have trouble using the spirometer as often as instructed. Your pain medication is not giving enough relief while using the spirometer. You develop fever of 100.5 F (38.1 C) or higher. SEEK IMMEDIATE MEDICAL CARE IF:  You cough up bloody sputum that had not been present before. You develop fever of 102 F (38.9 C) or greater. You develop worsening pain at or near the  incision site. MAKE SURE YOU:  Understand these instructions. Will  watch your condition. Will get help right away if you are not doing well or get worse. Document Released: 12/24/2006 Document Revised: 11/05/2011 Document Reviewed: 02/24/2007 [facility] Patient Information 2014 [place], [place].   WHAT IS A BLOOD TRANSFUSION? Blood Transfusion Information  A transfusion is the replacement of blood or some of its parts. Blood is made up of multiple cells which provide different functions. Red blood cells carry oxygen and are used for blood loss replacement. White blood cells fight against infection. Platelets control bleeding. Plasma helps clot blood. Other blood products are available for specialized needs, such as hemophilia or other clotting disorders. BEFORE THE TRANSFUSION  Who gives blood for transfusions?  Healthy volunteers who are fully evaluated to make sure their blood is safe. This is blood bank blood. Transfusion therapy is the safest it has ever been in the practice of medicine. Before blood is taken from a donor, a complete history is taken to make sure that person has no history of diseases nor engages in risky social behavior (examples are intravenous drug use or sexual activity with multiple partners). The donor's travel history is screened to minimize risk of transmitting infections, such as malaria. The donated blood is tested for signs of infectious diseases, such as HIV and hepatitis. The blood is then tested to be sure it is compatible with you in order to minimize the chance of a transfusion reaction. If you or a relative donates blood, this is often done in anticipation of surgery and is not appropriate for emergency situations. It takes many days to process the donated blood. RISKS AND COMPLICATIONS Although transfusion therapy is very safe and saves many lives, the main dangers of transfusion include:  Getting an infectious disease. Developing a transfusion reaction. This is an allergic reaction to something in the blood you were given.  Every precaution is taken to prevent this. The decision to have a blood transfusion has been considered carefully by your caregiver before blood is given. Blood is not given unless the benefits outweigh the risks. AFTER THE TRANSFUSION Right after receiving a blood transfusion, you will usually feel much better and more energetic. This is especially true if your red blood cells have gotten low (anemic). The transfusion raises the level of the red blood cells which carry oxygen, and this usually causes an energy increase. The nurse administering the transfusion will monitor you carefully for complications. HOME CARE INSTRUCTIONS  No special instructions are needed after a transfusion. You may find your energy is better. Speak with your caregiver about any limitations on activity for underlying diseases you may have. SEEK MEDICAL CARE IF:  Your condition is not improving after your transfusion. You develop redness or irritation at the intravenous (IV) site. SEEK IMMEDIATE MEDICAL CARE IF:  Any of the following symptoms occur over the next 12 hours: Shaking chills. You have a temperature by mouth above 102 F (38.9 C), not controlled by medicine. Chest, back, or muscle pain. People around you feel you are not acting correctly or are confused. Shortness of breath or difficulty breathing. Dizziness and fainting. You get a rash or develop hives. You have a decrease in urine output. Your urine turns a dark color or changes to pink, red, or brown. Any of the following symptoms occur over the next 10 days: You have a temperature by mouth above 102 F (38.9 C), not controlled by medicine. Shortness of breath. Weakness after normal activity. The white  part of the eye turns yellow (jaundice). You have a decrease in the amount of urine or are urinating less often. Your urine turns a dark color or changes to pink, red, or brown. Document Released: 08/10/2000 Document Revised: 11/05/2011 Document  Reviewed: 03/29/2008 [facility] Patient Information 2014 [place], [place].  _______________________________________________________________________    ________________________________________________________________________ ______________________________________________________________________

## 2021-04-25 ENCOUNTER — Encounter (HOSPITAL_COMMUNITY)
Admission: RE | Admit: 2021-04-25 | Discharge: 2021-04-25 | Disposition: A | Discharge status: Home / Self Care | Payer: 59 | Source: Ambulatory Visit | Attending: Surgery | Admitting: Surgery

## 2021-04-25 ENCOUNTER — Encounter (HOSPITAL_COMMUNITY): Payer: Self-pay

## 2021-04-25 ENCOUNTER — Other Ambulatory Visit: Payer: Self-pay

## 2021-04-25 DIAGNOSIS — Z01812 Encounter for preprocedural laboratory examination: Secondary | ICD-10-CM | POA: Diagnosis not present

## 2021-04-25 HISTORY — DX: Personal history of urinary calculi: Z87.442

## 2021-04-25 HISTORY — DX: Heart failure, unspecified: I50.9

## 2021-04-25 LAB — CBC
HCT: 45 % (ref 39.0–52.0)
Hemoglobin: 15.3 g/dL (ref 13.0–17.0)
MCH: 30.6 pg (ref 26.0–34.0)
MCHC: 34 g/dL (ref 30.0–36.0)
MCV: 90 fL (ref 80.0–100.0)
Platelets: 199 10*3/uL (ref 150–400)
RBC: 5 MIL/uL (ref 4.22–5.81)
RDW: 12 % (ref 11.5–15.5)
WBC: 6.2 10*3/uL (ref 4.0–10.5)
nRBC: 0 % (ref 0.0–0.2)

## 2021-04-25 LAB — BASIC METABOLIC PANEL
Anion gap: 6 (ref 5–15)
BUN: 23 mg/dL (ref 8–23)
CO2: 25 mmol/L (ref 22–32)
Calcium: 9.7 mg/dL (ref 8.9–10.3)
Chloride: 108 mmol/L (ref 98–111)
Creatinine, Ser: 0.93 mg/dL (ref 0.61–1.24)
GFR, Estimated: >60 mL/min (ref >60)
Glucose, Bld: 103 mg/dL — ABNORMAL HIGH (ref 70–99)
Potassium: 4.9 mmol/L (ref 3.5–5.1)
Sodium: 139 mmol/L (ref 135–145)

## 2021-04-25 NOTE — Progress Notes (Addendum)
COVID swab appointment:  05-08-21  COVID Vaccine Completed:  Yes x3 Date COVID Vaccine completed: Has received booster:  Yes x1 COVID vaccine manufacturer: Pfizer      Date of COVID positive in last 90 days:  No  PCP - Debbrah Alar, NP Cardiologist - Minta Balsam, MD  Cardiac clearance in Epic dated 03-31-21 Kathyrn Drown, NP  Chest x-ray - CT chest 01-02-21 Epic EKG - 01-26-21 Epic Stress Test - greater than 2 years ECHO - 11-05-19 Epic Cardiac Cath - 10-29-17 Epic Pacemaker/ICD device last checked: Spinal Cord Stimulator:  Sleep Study - 09-23-19 Epic, pos sleep apnea CPAP -  Yes  Fasting Blood Sugar - N/A Checks Blood Sugar _____ times a day  Blood Thinner Instructions: Aspirin Instructions:  ASA 81 mg.  Pt to check to see when he needs to stop Last Dose:  Activity level:  Can go up a flight of stairs and perform activities of daily living without stopping and without symptoms of chest pain or shortness of breath.   Anesthesia review:  CAD, heart failure, HTN, OSA  Patient denies shortness of breath, fever, cough and chest pain at PAT appointment   Patient verbalized understanding of instructions that were given to them at the PAT appointment. Patient was also instructed that they will need to review over the PAT instructions again at home before surgery.

## 2021-04-25 NOTE — Progress Notes (Signed)
Spoke to Ryan [name] at Dr. Johney [place] office regarding posting sheet stating that patient is ambulatory.  Patient stated that he would be in the hospital a few days postop.  [name] will have Dr. Johney [place] correct this.

## 2021-04-26 LAB — HEMOGLOBIN A1C
Hgb A1c MFr Bld: 5.6 % (ref 4.8–5.6)
Mean Plasma Glucose: 114 mg/dL

## 2021-04-27 NOTE — Anesthesia Preprocedure Evaluation (Addendum)
Anesthesia Evaluation  Patient identified by MRN, date of birth, ID band Patient awake    Reviewed: Allergy & Precautions, NPO status , Patient's Chart, lab work & pertinent test results  Airway Mallampati: III  TM Distance: >3 FB Neck ROM: Full    Dental  (+) Dental Advisory Given   Pulmonary sleep apnea , Current Smoker,    breath sounds clear to auscultation       Cardiovascular hypertension, Pt. on medications + CAD   Rhythm:Regular Rate:Normal     Neuro/Psych negative neurological ROS     GI/Hepatic Neg liver ROS, GERD  ,  Endo/Other  negative endocrine ROS  Renal/GU negative Renal ROS     Musculoskeletal   Abdominal   Peds  Hematology negative hematology ROS (+)   Anesthesia Other Findings   Reproductive/Obstetrics                           Lab Results  Component Value Date   WBC 6.2 04/25/2021   HGB 15.3 04/25/2021   HCT 45.0 04/25/2021   MCV 90.0 04/25/2021   PLT 199 04/25/2021   Lab Results  Component Value Date   CREATININE 0.93 04/25/2021   BUN 23 04/25/2021   NA 139 04/25/2021   K 4.9 04/25/2021   CL 108 04/25/2021   CO2 25 04/25/2021    Anesthesia Physical Anesthesia Plan  ASA: 2  Anesthesia Plan: General   Post-op Pain Management:    Induction: Intravenous  PONV Risk Score and Plan: 2 and Dexamethasone, Ondansetron and Treatment may vary due to age or medical condition  Airway Management Planned: Oral ETT  Additional Equipment:   Intra-op Plan:   Post-operative Plan: Extubation in OR  Informed Consent: I have reviewed the patients History and Physical, chart, labs and discussed the procedure including the risks, benefits and alternatives for the proposed anesthesia with the patient or authorized representative who has indicated his/her understanding and acceptance.     Dental advisory given  Plan Discussed with:   Anesthesia Plan Comments:         Anesthesia Quick Evaluation

## 2021-04-27 NOTE — Progress Notes (Signed)
Anesthesia Chart Review   Case: [id number] Date/Time: 05/10/21 0815   Procedure: ROBOTIC COLECTOMY PROXIMAL   Anesthesia type: General   Pre-op diagnosis: NEUROENDOCRINE TUMOR OF ILEUM   Location: [facility] ROOM 02 / [facility]   Surgeons: Ryan Boston, MD       DISCUSSION:65 y.o. smoker with h/o HTN, GERD, sleep apnea, CAD, CHF, neuroendocrine tumor or ileum scheduled for above procedure 05/10/2021 with Dr. Michael [name].   Low risk stress test 10/20/2014.   Per cardiology preoperative evaluation 03/31/2021, "Chart reviewed as part of pre-operative protocol coverage. Given past medical history and time since last visit, based on ACC/AHA guidelines, Ryan [name]. would be at acceptable risk for the planned procedure without further cardiovascular testing.    The patient was recently seen in follow up at which time he was doing well from a CV standpoint with no chest pain or SOB. He is very active at baseline and can achieve 4METS or greater without CV symptoms. He has no prior hx of CAD."  Anticipate pt can proceed with planned procedure barring acute status change.   VS: BP (!) 155/75   Pulse 65   Temp 36.8 C (Oral)   Resp 20   Ht '5\' 9"'$  (1.753 m)   Wt 103.9 kg   SpO2 98%   BMI 33.82 kg/m   PROVIDERS: Ryan Alar, NP is PCP   Primary Cardiologist: Ryan Bush, MD LABS: Labs reviewed: Acceptable for surgery. (all labs ordered are listed, but only abnormal results are displayed)  Labs Reviewed  BASIC METABOLIC PANEL - Abnormal; Notable for the following components:      Result Value   Glucose, Bld 103 (*)    All other components within normal limits  HEMOGLOBIN A1C  CBC     IMAGES:   EKG: 01/26/21 Rate 64 bpm  NSR  CV: Echo 11/05/2019  1. Left ventricular ejection fraction, by estimation, is 60 to 65%. The  left ventricle has normal function. The left ventricle has no regional  wall motion abnormalities. Left ventricular diastolic parameters are  consistent  with Grade II diastolic  dysfunction (pseudonormalization).   2. Right ventricular systolic function is normal. The right ventricular  size is normal. Tricuspid regurgitation signal is inadequate for assessing  PA pressure.   3. Left atrial size was mildly dilated.   4. Mild mitral valve regurgitation.   Cardiac Cath 10/29/2017 Conclusions: Mild to moderate coronary artery disease, with long 50% stenosis of the mid/distal LAD and 20% ostial LCx disease. Low normal left ventricular contraction (LVEF 50-55%). Upper normal left ventricular filling pressure (LVEDP 15 mmHg). Past Medical History:  Diagnosis Date   Abnormal liver function    history of    Atypical chest pain    Blind left eye    blind left eye since birth   Cancer [facility])    skin:  basal cell carcinoma/ on face,back and neck   Cataract    Bil   CHF (congestive heart failure) ([place])    Diastolic dysfunction    [id number] Echo: EF 55-60%, Gr1 DD, triv MR.   Fatty liver    fatty liver disease   GERD (gastroesophageal reflux disease)    History of indigestion    History of kidney stones    Hyperlipidemia    Hypertension    Non-obstructive CAD (coronary artery disease)    a. 2016 Neg MV; b. 10/2017 Cath: LM nl, LAD 19m D1/2/3 nl, LCX 20p, RCA nl, EF 50-55%.   Sleep apnea  SOB (shortness of breath) 2019   after climbing stairs    Past Surgical History:  Procedure Laterality Date   CARDIAC CATHETERIZATION     COLONOSCOPY     LEFT HEART CATH AND CORONARY ANGIOGRAPHY Left 10/29/2017   Procedure: LEFT HEART CATH AND CORONARY ANGIOGRAPHY;  Surgeon: Ryan Bush, MD;  Location: [place] [facility];  Service: Cardiovascular;  Laterality: Left;   [place]   as a child    MEDICATIONS:  aspirin EC 81 MG tablet   losartan (COZAAR) 25 MG tablet   Omega-3 Fatty Acids (FISH OIL) 1000 MG CAPS   No current facility-administered medications for this encounter.     Ryan Felix  Ward, PA-C [facility] Pre-Surgical Testing [phone]

## 2021-05-08 ENCOUNTER — Other Ambulatory Visit: Payer: Self-pay | Admitting: Surgery

## 2021-05-09 LAB — SARS CORONAVIRUS 2 (TAT 6-24 HRS): SARS Coronavirus 2: NEGATIVE

## 2021-05-10 ENCOUNTER — Encounter (HOSPITAL_COMMUNITY): Payer: Self-pay | Admitting: Surgery

## 2021-05-10 ENCOUNTER — Encounter (HOSPITAL_COMMUNITY)
Admission: RE | Disposition: A | Discharge status: Home / Self Care | Payer: Self-pay | Source: Home / Self Care | Attending: Surgery

## 2021-05-10 ENCOUNTER — Inpatient Hospital Stay (HOSPITAL_COMMUNITY)
Admission: RE | Admit: 2021-05-10 | Discharge: 2021-05-12 | DRG: 330 | Disposition: A | Discharge status: Home / Self Care | Payer: 59 | Attending: General Surgery | Admitting: General Surgery

## 2021-05-10 ENCOUNTER — Other Ambulatory Visit: Payer: Self-pay

## 2021-05-10 ENCOUNTER — Inpatient Hospital Stay (HOSPITAL_COMMUNITY): Payer: 59 | Admitting: Certified Registered"

## 2021-05-10 ENCOUNTER — Inpatient Hospital Stay (HOSPITAL_COMMUNITY): Payer: 59 | Admitting: Physician Assistant

## 2021-05-10 DIAGNOSIS — D3A012 Benign carcinoid tumor of the ileum: Secondary | ICD-10-CM

## 2021-05-10 DIAGNOSIS — Z87442 Personal history of urinary calculi: Secondary | ICD-10-CM | POA: Diagnosis not present

## 2021-05-10 DIAGNOSIS — K429 Umbilical hernia without obstruction or gangrene: Secondary | ICD-10-CM | POA: Diagnosis present

## 2021-05-10 DIAGNOSIS — H5462 Unqualified visual loss, left eye, normal vision right eye: Secondary | ICD-10-CM | POA: Diagnosis present

## 2021-05-10 DIAGNOSIS — E785 Hyperlipidemia, unspecified: Secondary | ICD-10-CM | POA: Diagnosis present

## 2021-05-10 DIAGNOSIS — I110 Hypertensive heart disease with heart failure: Secondary | ICD-10-CM | POA: Diagnosis present

## 2021-05-10 DIAGNOSIS — N200 Calculus of kidney: Secondary | ICD-10-CM | POA: Diagnosis present

## 2021-05-10 DIAGNOSIS — Z8249 Family history of ischemic heart disease and other diseases of the circulatory system: Secondary | ICD-10-CM

## 2021-05-10 DIAGNOSIS — Z888 Allergy status to other drugs, medicaments and biological substances status: Secondary | ICD-10-CM | POA: Diagnosis not present

## 2021-05-10 DIAGNOSIS — C772 Secondary and unspecified malignant neoplasm of intra-abdominal lymph nodes: Secondary | ICD-10-CM | POA: Diagnosis present

## 2021-05-10 DIAGNOSIS — K219 Gastro-esophageal reflux disease without esophagitis: Secondary | ICD-10-CM | POA: Diagnosis present

## 2021-05-10 DIAGNOSIS — K760 Fatty (change of) liver, not elsewhere classified: Secondary | ICD-10-CM | POA: Diagnosis present

## 2021-05-10 DIAGNOSIS — F17290 Nicotine dependence, other tobacco product, uncomplicated: Secondary | ICD-10-CM | POA: Diagnosis present

## 2021-05-10 DIAGNOSIS — Z800 Family history of malignant neoplasm of digestive organs: Secondary | ICD-10-CM | POA: Diagnosis not present

## 2021-05-10 DIAGNOSIS — I5032 Chronic diastolic (congestive) heart failure: Secondary | ICD-10-CM | POA: Diagnosis present

## 2021-05-10 DIAGNOSIS — Z833 Family history of diabetes mellitus: Secondary | ICD-10-CM | POA: Diagnosis not present

## 2021-05-10 DIAGNOSIS — I10 Essential (primary) hypertension: Secondary | ICD-10-CM | POA: Diagnosis present

## 2021-05-10 DIAGNOSIS — R911 Solitary pulmonary nodule: Secondary | ICD-10-CM | POA: Diagnosis present

## 2021-05-10 DIAGNOSIS — C7A012 Malignant carcinoid tumor of the ileum: Principal | ICD-10-CM | POA: Diagnosis present

## 2021-05-10 DIAGNOSIS — G4733 Obstructive sleep apnea (adult) (pediatric): Secondary | ICD-10-CM | POA: Diagnosis present

## 2021-05-10 DIAGNOSIS — I7300 Raynaud's syndrome without gangrene: Secondary | ICD-10-CM | POA: Diagnosis present

## 2021-05-10 DIAGNOSIS — I25119 Atherosclerotic heart disease of native coronary artery with unspecified angina pectoris: Secondary | ICD-10-CM | POA: Diagnosis present

## 2021-05-10 HISTORY — DX: Benign carcinoid tumor of the ileum: D3A.012

## 2021-05-10 LAB — ABO/RH: ABO/RH(D): O POS

## 2021-05-10 LAB — TYPE AND SCREEN
ABO/RH(D): O POS
Antibody Screen: NEGATIVE

## 2021-05-10 SURGERY — COLECTOMY, SIGMOID, ROBOT-ASSISTED
Anesthesia: General | Site: Abdomen

## 2021-05-10 MED ORDER — CHLORHEXIDINE GLUCONATE 0.12 % MT SOLN
15.0000 mL | Freq: Once | OROMUCOSAL | Status: AC
  Administered 2021-05-10: 15 mL via OROMUCOSAL

## 2021-05-10 MED ORDER — ACETAMINOPHEN 500 MG PO TABS
1000.0000 mg | ORAL_TABLET | Freq: Four times a day (QID) | ORAL | Status: DC
Start: 2021-05-10 — End: 2021-05-12
  Administered 2021-05-10 – 2021-05-12 (×6): 1000 mg via ORAL
  Filled 2021-05-10 (×6): qty 2

## 2021-05-10 MED ORDER — FENTANYL CITRATE (PF) 100 MCG/2ML IJ SOLN
INTRAMUSCULAR | Status: AC
  Filled 2021-05-10: qty 2

## 2021-05-10 MED ORDER — BUPIVACAINE-EPINEPHRINE (PF) 0.25% -1:200000 IJ SOLN
INTRAMUSCULAR | Status: AC
  Filled 2021-05-10: qty 60

## 2021-05-10 MED ORDER — 0.9 % SODIUM CHLORIDE (POUR BTL) OPTIME
TOPICAL | Status: DC | PRN
  Administered 2021-05-10: 2000 mL

## 2021-05-10 MED ORDER — ACETAMINOPHEN 500 MG PO TABS
1000.0000 mg | ORAL_TABLET | ORAL | Status: AC
  Administered 2021-05-10: 1000 mg via ORAL
  Filled 2021-05-10: qty 2

## 2021-05-10 MED ORDER — KETAMINE HCL 10 MG/ML IJ SOLN
INTRAMUSCULAR | Status: AC
  Filled 2021-05-10: qty 1

## 2021-05-10 MED ORDER — LIDOCAINE 2% (20 MG/ML) 5 ML SYRINGE
INTRAMUSCULAR | Status: DC | PRN
  Administered 2021-05-10: 40 mg via INTRAVENOUS

## 2021-05-10 MED ORDER — DIPHENHYDRAMINE HCL 12.5 MG/5ML PO ELIX: 12.5000 mg | ORAL_SOLUTION | Freq: Four times a day (QID) | ORAL | Status: DC | PRN

## 2021-05-10 MED ORDER — SIMETHICONE 80 MG PO CHEW: 40.0000 mg | CHEWABLE_TABLET | Freq: Four times a day (QID) | ORAL | Status: DC | PRN

## 2021-05-10 MED ORDER — LACTATED RINGERS IV SOLN: INTRAVENOUS | Status: DC | PRN

## 2021-05-10 MED ORDER — METHOCARBAMOL 500 MG PO TABS: 1000.0000 mg | ORAL_TABLET | Freq: Four times a day (QID) | ORAL | Status: DC | PRN

## 2021-05-10 MED ORDER — SODIUM CHLORIDE 0.9 % IV SOLN: Freq: Three times a day (TID) | INTRAVENOUS | Status: DC | PRN

## 2021-05-10 MED ORDER — LIP MEDEX EX OINT
1.0000 "application " | TOPICAL_OINTMENT | Freq: Two times a day (BID) | CUTANEOUS | Status: DC
  Administered 2021-05-10 – 2021-05-11 (×4): 1 via TOPICAL
  Filled 2021-05-10: qty 7

## 2021-05-10 MED ORDER — BUPIVACAINE LIPOSOME 1.3 % IJ SUSP
INTRAMUSCULAR | Status: AC
  Filled 2021-05-10: qty 20

## 2021-05-10 MED ORDER — ONDANSETRON HCL 4 MG/2ML IJ SOLN
INTRAMUSCULAR | Status: AC
  Filled 2021-05-10: qty 2

## 2021-05-10 MED ORDER — EPHEDRINE SULFATE-NACL 50-0.9 MG/10ML-% IV SOSY
PREFILLED_SYRINGE | INTRAVENOUS | Status: DC | PRN
  Administered 2021-05-10: 5 mg via INTRAVENOUS

## 2021-05-10 MED ORDER — SODIUM CHLORIDE 0.9 % IV SOLN
2.0000 g | Freq: Two times a day (BID) | INTRAVENOUS | Status: AC
  Administered 2021-05-10: 2 g via INTRAVENOUS
  Filled 2021-05-10: qty 2

## 2021-05-10 MED ORDER — LOSARTAN POTASSIUM 25 MG PO TABS
25.0000 mg | ORAL_TABLET | Freq: Every day | ORAL | Status: DC
  Administered 2021-05-10 – 2021-05-12 (×3): 25 mg via ORAL
  Filled 2021-05-10 (×3): qty 1

## 2021-05-10 MED ORDER — HYDRALAZINE HCL 20 MG/ML IJ SOLN
INTRAMUSCULAR | Status: DC | PRN
  Administered 2021-05-10 (×2): 1 mg via INTRAVENOUS
  Administered 2021-05-10: 2 mg via INTRAVENOUS

## 2021-05-10 MED ORDER — MIDAZOLAM HCL 2 MG/2ML IJ SOLN
INTRAMUSCULAR | Status: AC
  Filled 2021-05-10: qty 2

## 2021-05-10 MED ORDER — ENOXAPARIN SODIUM 40 MG/0.4ML IJ SOSY
40.0000 mg | PREFILLED_SYRINGE | INTRAMUSCULAR | Status: DC
  Administered 2021-05-11 – 2021-05-12 (×2): 40 mg via SUBCUTANEOUS
  Filled 2021-05-10 (×2): qty 0.4

## 2021-05-10 MED ORDER — ENSURE SURGERY PO LIQD
237.0000 mL | Freq: Two times a day (BID) | ORAL | Status: DC
  Administered 2021-05-11 (×2): 237 mL via ORAL

## 2021-05-10 MED ORDER — ONDANSETRON HCL 4 MG/2ML IJ SOLN
INTRAMUSCULAR | Status: DC | PRN
  Administered 2021-05-10: 4 mg via INTRAVENOUS

## 2021-05-10 MED ORDER — PROCHLORPERAZINE MALEATE 10 MG PO TABS
10.0000 mg | ORAL_TABLET | Freq: Four times a day (QID) | ORAL | Status: DC | PRN
  Filled 2021-05-10: qty 1

## 2021-05-10 MED ORDER — POLYETHYLENE GLYCOL 3350 17 GM/SCOOP PO POWD: 1.0000 | Freq: Once | ORAL | Status: DC

## 2021-05-10 MED ORDER — METHOCARBAMOL 1000 MG/10ML IJ SOLN
1000.0000 mg | Freq: Four times a day (QID) | INTRAVENOUS | Status: DC | PRN
  Filled 2021-05-10 (×2): qty 10

## 2021-05-10 MED ORDER — LACTATED RINGERS IV BOLUS: 1000.0000 mL | Freq: Three times a day (TID) | INTRAVENOUS | Status: DC | PRN

## 2021-05-10 MED ORDER — KETAMINE HCL 10 MG/ML IJ SOLN
INTRAMUSCULAR | Status: DC | PRN
  Administered 2021-05-10 (×2): 20 mg via INTRAVENOUS

## 2021-05-10 MED ORDER — TRAMADOL HCL 50 MG PO TABS: 50.0000 mg | ORAL_TABLET | Freq: Four times a day (QID) | ORAL | Status: DC | PRN

## 2021-05-10 MED ORDER — BUPIVACAINE LIPOSOME 1.3 % IJ SUSP: 20.0000 mL | Freq: Once | INTRAMUSCULAR | Status: DC

## 2021-05-10 MED ORDER — ONDANSETRON HCL 4 MG PO TABS: 4.0000 mg | ORAL_TABLET | Freq: Four times a day (QID) | ORAL | Status: DC | PRN

## 2021-05-10 MED ORDER — LACTATED RINGERS IV SOLN: INTRAVENOUS | Status: DC

## 2021-05-10 MED ORDER — PROPOFOL 10 MG/ML IV BOLUS
INTRAVENOUS | Status: AC
  Filled 2021-05-10: qty 40

## 2021-05-10 MED ORDER — HYDROMORPHONE HCL 1 MG/ML IJ SOLN
0.5000 mg | INTRAMUSCULAR | Status: DC | PRN
Start: 2021-05-10 — End: 2021-05-12

## 2021-05-10 MED ORDER — BISACODYL 5 MG PO TBEC: 20.0000 mg | DELAYED_RELEASE_TABLET | Freq: Once | ORAL | Status: DC

## 2021-05-10 MED ORDER — PROPOFOL 10 MG/ML IV BOLUS
INTRAVENOUS | Status: DC | PRN
  Administered 2021-05-10: 200 mg via INTRAVENOUS

## 2021-05-10 MED ORDER — SODIUM CHLORIDE (PF) 0.9 % IJ SOLN
INTRAMUSCULAR | Status: AC
  Filled 2021-05-10: qty 10

## 2021-05-10 MED ORDER — BUPIVACAINE-EPINEPHRINE (PF) 0.25% -1:200000 IJ SOLN
INTRAMUSCULAR | Status: DC | PRN
  Administered 2021-05-10: 60 mL

## 2021-05-10 MED ORDER — LIDOCAINE HCL (PF) 2 % IJ SOLN
INTRAMUSCULAR | Status: AC
  Filled 2021-05-10: qty 10

## 2021-05-10 MED ORDER — AMISULPRIDE (ANTIEMETIC) 5 MG/2ML IV SOLN: 10.0000 mg | Freq: Once | INTRAVENOUS | Status: DC | PRN

## 2021-05-10 MED ORDER — METRONIDAZOLE 500 MG PO TABS: 1000.0000 mg | ORAL_TABLET | ORAL | Status: DC

## 2021-05-10 MED ORDER — ENSURE PRE-SURGERY PO LIQD
592.0000 mL | Freq: Once | ORAL | Status: DC
  Filled 2021-05-10: qty 592

## 2021-05-10 MED ORDER — SUGAMMADEX SODIUM 200 MG/2ML IV SOLN
INTRAVENOUS | Status: DC | PRN
  Administered 2021-05-10: 200 mg via INTRAVENOUS

## 2021-05-10 MED ORDER — ENOXAPARIN SODIUM 40 MG/0.4ML IJ SOSY
40.0000 mg | PREFILLED_SYRINGE | Freq: Once | INTRAMUSCULAR | Status: AC
  Administered 2021-05-10: 40 mg via SUBCUTANEOUS
  Filled 2021-05-10: qty 0.4

## 2021-05-10 MED ORDER — DEXAMETHASONE SODIUM PHOSPHATE 10 MG/ML IJ SOLN
INTRAMUSCULAR | Status: AC
  Filled 2021-05-10: qty 1

## 2021-05-10 MED ORDER — ASPIRIN EC 81 MG PO TBEC
81.0000 mg | DELAYED_RELEASE_TABLET | Freq: Every day | ORAL | Status: DC
  Administered 2021-05-11 – 2021-05-12 (×2): 81 mg via ORAL
  Filled 2021-05-10 (×2): qty 1

## 2021-05-10 MED ORDER — ALVIMOPAN 12 MG PO CAPS
12.0000 mg | ORAL_CAPSULE | Freq: Two times a day (BID) | ORAL | Status: DC
  Filled 2021-05-10 (×3): qty 1

## 2021-05-10 MED ORDER — GABAPENTIN 300 MG PO CAPS
300.0000 mg | ORAL_CAPSULE | ORAL | Status: AC
  Administered 2021-05-10: 300 mg via ORAL
  Filled 2021-05-10: qty 1

## 2021-05-10 MED ORDER — DIPHENHYDRAMINE HCL 50 MG/ML IJ SOLN: 12.5000 mg | Freq: Four times a day (QID) | INTRAMUSCULAR | Status: DC | PRN

## 2021-05-10 MED ORDER — LACTATED RINGERS IR SOLN
Status: DC | PRN
  Administered 2021-05-10: 1000 mL

## 2021-05-10 MED ORDER — CELECOXIB 200 MG PO CAPS
200.0000 mg | ORAL_CAPSULE | ORAL | Status: AC
  Administered 2021-05-10: 200 mg via ORAL
  Filled 2021-05-10: qty 1

## 2021-05-10 MED ORDER — HYDRALAZINE HCL 20 MG/ML IJ SOLN
INTRAMUSCULAR | Status: AC
  Filled 2021-05-10: qty 1

## 2021-05-10 MED ORDER — DEXAMETHASONE SODIUM PHOSPHATE 10 MG/ML IJ SOLN
INTRAMUSCULAR | Status: DC | PRN
  Administered 2021-05-10: 8 mg via INTRAVENOUS

## 2021-05-10 MED ORDER — ORAL CARE MOUTH RINSE: 15.0000 mL | Freq: Once | OROMUCOSAL | Status: AC

## 2021-05-10 MED ORDER — MAGIC MOUTHWASH
15.0000 mL | Freq: Four times a day (QID) | ORAL | Status: DC | PRN
  Filled 2021-05-10: qty 15

## 2021-05-10 MED ORDER — ENSURE PRE-SURGERY PO LIQD
296.0000 mL | Freq: Once | ORAL | Status: DC
  Filled 2021-05-10: qty 296

## 2021-05-10 MED ORDER — MELATONIN 3 MG PO TABS: 3.0000 mg | ORAL_TABLET | Freq: Every evening | ORAL | Status: DC | PRN

## 2021-05-10 MED ORDER — BUPIVACAINE LIPOSOME 1.3 % IJ SUSP
INTRAMUSCULAR | Status: DC | PRN
  Administered 2021-05-10: 20 mL

## 2021-05-10 MED ORDER — METOPROLOL TARTRATE 5 MG/5ML IV SOLN: 5.0000 mg | Freq: Four times a day (QID) | INTRAVENOUS | Status: DC | PRN

## 2021-05-10 MED ORDER — ROCURONIUM BROMIDE 10 MG/ML (PF) SYRINGE
PREFILLED_SYRINGE | INTRAVENOUS | Status: DC | PRN
  Administered 2021-05-10: 60 mg via INTRAVENOUS
  Administered 2021-05-10: 20 mg via INTRAVENOUS
  Administered 2021-05-10: 10 mg via INTRAVENOUS

## 2021-05-10 MED ORDER — FENTANYL CITRATE PF 50 MCG/ML IJ SOSY: 25.0000 ug | PREFILLED_SYRINGE | INTRAMUSCULAR | Status: DC | PRN

## 2021-05-10 MED ORDER — NEOMYCIN SULFATE 500 MG PO TABS: 1000.0000 mg | ORAL_TABLET | ORAL | Status: DC

## 2021-05-10 MED ORDER — MIDAZOLAM HCL 2 MG/2ML IJ SOLN
INTRAMUSCULAR | Status: DC | PRN
  Administered 2021-05-10: 2 mg via INTRAVENOUS

## 2021-05-10 MED ORDER — ONDANSETRON HCL 4 MG/2ML IJ SOLN: 4.0000 mg | Freq: Four times a day (QID) | INTRAMUSCULAR | Status: DC | PRN

## 2021-05-10 MED ORDER — PROCHLORPERAZINE EDISYLATE 10 MG/2ML IJ SOLN: 5.0000 mg | Freq: Four times a day (QID) | INTRAMUSCULAR | Status: DC | PRN

## 2021-05-10 MED ORDER — CEFOTETAN DISODIUM 2 G IJ SOLR
2.0000 g | INTRAMUSCULAR | Status: AC
  Administered 2021-05-10: 2 g via INTRAVENOUS
  Filled 2021-05-10: qty 2

## 2021-05-10 MED ORDER — FENTANYL CITRATE (PF) 250 MCG/5ML IJ SOLN
INTRAMUSCULAR | Status: DC | PRN
  Administered 2021-05-10 (×2): 50 ug via INTRAVENOUS
  Administered 2021-05-10: 100 ug via INTRAVENOUS
  Administered 2021-05-10: 50 ug via INTRAVENOUS

## 2021-05-10 MED ORDER — ROCURONIUM BROMIDE 10 MG/ML (PF) SYRINGE
PREFILLED_SYRINGE | INTRAVENOUS | Status: AC
  Filled 2021-05-10: qty 10

## 2021-05-10 MED ORDER — ALVIMOPAN 12 MG PO CAPS
12.0000 mg | ORAL_CAPSULE | ORAL | Status: AC
  Administered 2021-05-10: 12 mg via ORAL
  Filled 2021-05-10: qty 1

## 2021-05-10 MED ORDER — ALUM & MAG HYDROXIDE-SIMETH 200-200-20 MG/5ML PO SUSP: 30.0000 mL | Freq: Four times a day (QID) | ORAL | Status: DC | PRN

## 2021-05-10 SURGICAL SUPPLY — 113 items
APL PRP STRL LF DISP 70% ISPRP (MISCELLANEOUS)
APPLIER CLIP 5 13 M/L LIGAMAX5 (MISCELLANEOUS)
APPLIER CLIP ROT 10 11.4 M/L (STAPLE)
APR CLP MED LRG 11.4X10 (STAPLE)
APR CLP MED LRG 5 ANG JAW (MISCELLANEOUS)
BAG COUNTER SPONGE SURGICOUNT (BAG) ×2 IMPLANT
BAG SPNG CNTER NS LX DISP (BAG) ×1
BLADE EXTENDED COATED 6.5IN (ELECTRODE) IMPLANT
CANNULA REDUC XI 12-8 STAPL (CANNULA)
CANNULA REDUCER 12-8 DVNC XI (CANNULA) IMPLANT
CELLS DAT CNTRL 66122 CELL SVR (MISCELLANEOUS) ×1 IMPLANT
CHLORAPREP W/TINT 26 (MISCELLANEOUS) IMPLANT
CLIP APPLIE 5 13 M/L LIGAMAX5 (MISCELLANEOUS) IMPLANT
CLIP APPLIE ROT 10 11.4 M/L (STAPLE) IMPLANT
COVER SURGICAL LIGHT HANDLE (MISCELLANEOUS) ×4 IMPLANT
COVER TIP SHEARS 8 DVNC (MISCELLANEOUS) ×1 IMPLANT
COVER TIP SHEARS 8MM DA VINCI (MISCELLANEOUS) ×2
DECANTER SPIKE VIAL GLASS SM (MISCELLANEOUS) ×2 IMPLANT
DEVICE TROCAR PUNCTURE CLOSURE (ENDOMECHANICALS) IMPLANT
DRAIN CHANNEL 19F RND (DRAIN) IMPLANT
DRAPE ARM DVNC X/XI (DISPOSABLE) ×4 IMPLANT
DRAPE COLUMN DVNC XI (DISPOSABLE) ×1 IMPLANT
DRAPE DA VINCI XI ARM (DISPOSABLE) ×8
DRAPE DA VINCI XI COLUMN (DISPOSABLE) ×2
DRAPE SURG IRRIG POUCH 19X23 (DRAPES) ×2 IMPLANT
DRSG OPSITE POSTOP 4X10 (GAUZE/BANDAGES/DRESSINGS) IMPLANT
DRSG OPSITE POSTOP 4X6 (GAUZE/BANDAGES/DRESSINGS) ×1 IMPLANT
DRSG OPSITE POSTOP 4X8 (GAUZE/BANDAGES/DRESSINGS) IMPLANT
DRSG TEGADERM 2-3/8X2-3/4 SM (GAUZE/BANDAGES/DRESSINGS) ×10 IMPLANT
DRSG TEGADERM 4X4.75 (GAUZE/BANDAGES/DRESSINGS) IMPLANT
ELECT PENCIL ROCKER SW 15FT (MISCELLANEOUS) ×2 IMPLANT
ELECT REM PT RETURN 15FT ADLT (MISCELLANEOUS) ×2 IMPLANT
ENDOLOOP SUT PDS II  0 18 (SUTURE)
ENDOLOOP SUT PDS II 0 18 (SUTURE) IMPLANT
EVACUATOR SILICONE 100CC (DRAIN) IMPLANT
GAUZE SPONGE 2X2 8PLY STRL LF (GAUZE/BANDAGES/DRESSINGS) ×1 IMPLANT
GLOVE SURG NEOPR MICRO LF SZ8 (GLOVE) ×6 IMPLANT
GLOVE SURG UNDER LTX SZ8 (GLOVE) ×6 IMPLANT
GOWN STRL REUS W/TWL XL LVL3 (GOWN DISPOSABLE) ×8 IMPLANT
GRASPER SUT TROCAR 14GX15 (MISCELLANEOUS) IMPLANT
HOLDER FOLEY CATH W/STRAP (MISCELLANEOUS) ×2 IMPLANT
IRRIG SUCT STRYKERFLOW 2 WTIP (MISCELLANEOUS) ×2
IRRIGATION SUCT STRKRFLW 2 WTP (MISCELLANEOUS) ×1 IMPLANT
KIT PROCEDURE DA VINCI SI (MISCELLANEOUS)
KIT PROCEDURE DVNC SI (MISCELLANEOUS) IMPLANT
KIT SIGMOIDOSCOPE (SET/KITS/TRAYS/PACK) IMPLANT
KIT TURNOVER KIT A (KITS) ×2 IMPLANT
NDL INSUFFLATION 14GA 120MM (NEEDLE) ×1 IMPLANT
NEEDLE INSUFFLATION 14GA 120MM (NEEDLE) ×2 IMPLANT
PACK CARDIOVASCULAR III (CUSTOM PROCEDURE TRAY) ×2 IMPLANT
PACK COLON (CUSTOM PROCEDURE TRAY) ×2 IMPLANT
PAD POSITIONING PINK XL (MISCELLANEOUS) ×2 IMPLANT
PROTECTOR NERVE ULNAR (MISCELLANEOUS) ×3 IMPLANT
RELOAD STAPLE 60 3.5 BLU DVNC (STAPLE) IMPLANT
RELOAD STAPLE 60 4.3 GRN DVNC (STAPLE) IMPLANT
RELOAD STAPLER 3.5X45 BLU DVNC (STAPLE) IMPLANT
RELOAD STAPLER 3.5X60 BLU DVNC (STAPLE) ×2 IMPLANT
RELOAD STAPLER 4.3X45 GRN DVNC (STAPLE) IMPLANT
RELOAD STAPLER 4.3X60 GRN DVNC (STAPLE) IMPLANT
RETRACTOR WND ALEXIS 18 MED (MISCELLANEOUS) IMPLANT
RTRCTR WOUND ALEXIS 18CM MED (MISCELLANEOUS) ×2
SCISSORS LAP 5X35 DISP (ENDOMECHANICALS) ×2 IMPLANT
SEAL CANN UNIV 5-8 DVNC XI (MISCELLANEOUS) ×3 IMPLANT
SEAL XI 5MM-8MM UNIVERSAL (MISCELLANEOUS) ×6
SEALER VESSEL DA VINCI XI (MISCELLANEOUS) ×2
SEALER VESSEL EXT DVNC XI (MISCELLANEOUS) ×1 IMPLANT
SOLUTION ELECTROLUBE (MISCELLANEOUS) ×2 IMPLANT
SPONGE GAUZE 2X2 STER 10/PKG (GAUZE/BANDAGES/DRESSINGS) ×1
STAPLER 45 DA VINCI SURE FORM (STAPLE)
STAPLER 45 SUREFORM DVNC (STAPLE) IMPLANT
STAPLER 60 DA VINCI SURE FORM (STAPLE) ×2
STAPLER 60 SUREFORM DVNC (STAPLE) IMPLANT
STAPLER CANNULA SEAL DVNC XI (STAPLE) ×1 IMPLANT
STAPLER CANNULA SEAL XI (STAPLE) ×2
STAPLER ECHELON POWER CIR 29 (STAPLE) IMPLANT
STAPLER ECHELON POWER CIR 31 (STAPLE) IMPLANT
STAPLER RELOAD 3.5X45 BLU DVNC (STAPLE)
STAPLER RELOAD 3.5X45 BLUE (STAPLE)
STAPLER RELOAD 3.5X60 BLU DVNC (STAPLE) ×2
STAPLER RELOAD 3.5X60 BLUE (STAPLE) ×4
STAPLER RELOAD 4.3X45 GREEN (STAPLE)
STAPLER RELOAD 4.3X45 GRN DVNC (STAPLE)
STAPLER RELOAD 4.3X60 GREEN (STAPLE)
STAPLER RELOAD 4.3X60 GRN DVNC (STAPLE)
STOPCOCK 4 WAY LG BORE MALE ST (IV SETS) ×4 IMPLANT
SURGILUBE 2OZ TUBE FLIPTOP (MISCELLANEOUS) IMPLANT
SUT MNCRL AB 4-0 PS2 18 (SUTURE) ×2 IMPLANT
SUT PDS AB 1 CT1 27 (SUTURE) ×4 IMPLANT
SUT PROLENE 0 CT 2 (SUTURE) IMPLANT
SUT PROLENE 2 0 KS (SUTURE) IMPLANT
SUT PROLENE 2 0 SH DA (SUTURE) IMPLANT
SUT SILK 2 0 (SUTURE)
SUT SILK 2 0 SH CR/8 (SUTURE) IMPLANT
SUT SILK 2-0 18XBRD TIE 12 (SUTURE) IMPLANT
SUT SILK 3 0 (SUTURE)
SUT SILK 3 0 SH CR/8 (SUTURE) ×2 IMPLANT
SUT SILK 3-0 18XBRD TIE 12 (SUTURE) IMPLANT
SUT V-LOC BARB 180 2/0GR6 GS22 (SUTURE) ×4
SUT VIC AB 3-0 SH 18 (SUTURE) IMPLANT
SUT VIC AB 3-0 SH 27 (SUTURE)
SUT VIC AB 3-0 SH 27XBRD (SUTURE) IMPLANT
SUT VICRYL 0 UR6 27IN ABS (SUTURE) ×2 IMPLANT
SUTURE V-LC BRB 180 2/0GR6GS22 (SUTURE) IMPLANT
SYR 10ML ECCENTRIC (SYRINGE) ×2 IMPLANT
SYS LAPSCP GELPORT 120MM (MISCELLANEOUS)
SYS WOUND ALEXIS 18CM MED (MISCELLANEOUS)
SYSTEM LAPSCP GELPORT 120MM (MISCELLANEOUS) IMPLANT
SYSTEM WOUND ALEXIS 18CM MED (MISCELLANEOUS) ×1 IMPLANT
TOWEL OR NON WOVEN STRL DISP B (DISPOSABLE) ×2 IMPLANT
TRAY FOLEY MTR SLVR 16FR STAT (SET/KITS/TRAYS/PACK) ×2 IMPLANT
TROCAR ADV FIXATION 5X100MM (TROCAR) ×2 IMPLANT
TUBING CONNECTING 10 (TUBING) ×3 IMPLANT
TUBING INSUFFLATION 10FT LAP (TUBING) ×2 IMPLANT

## 2021-05-10 NOTE — Transfer of Care (Signed)
Immediate Anesthesia Transfer of Care Note  Patient: Ryan [name].  Procedure(s) Performed: ROBOTIC PROXIMAL COLECTOMY WITH BILATERAL TAP BLOCK (Abdomen)  Patient Location: PACU  Anesthesia Type:General  Level of Consciousness: awake, alert  and patient cooperative  Airway & Oxygen Therapy: Patient Spontanous Breathing and Patient connected to face mask oxygen  Post-op Assessment: Report given to RN and Post -op Vital signs reviewed and stable  Post vital signs: Reviewed and stable  Last Vitals:  Vitals Value Taken Time  BP 156/88 05/10/21 1053  Temp    Pulse 85 05/10/21 1055  Resp 11 05/10/21 1055  SpO2 100 % 05/10/21 1055  Vitals shown include unvalidated device data.  Last Pain:  Vitals:   05/10/21 0659  TempSrc: Oral         Complications: No notable events documented.

## 2021-05-10 NOTE — Anesthesia Procedure Notes (Signed)
Procedure Name: Intubation Date/Time: 05/10/2021 8:31 AM Performed by: Eben Burow, CRNA Pre-anesthesia Checklist: Patient identified, Emergency Drugs available, Suction available, Patient being monitored and Timeout performed Patient Re-evaluated:Patient Re-evaluated prior to induction Oxygen Delivery Method: Circle system utilized Preoxygenation: Pre-oxygenation with 100% oxygen Induction Type: IV induction Ventilation: Mask ventilation without difficulty and Oral airway inserted - appropriate to patient size Laryngoscope Size: Glidescope and 4 Tube type: Oral Tube size: 7.5 mm Number of attempts: 1 Airway Equipment and Method: Stylet Placement Confirmation: ETT inserted through vocal cords under direct vision, positive ETCO2 and breath sounds checked- equal and bilateral Secured at: 23 cm Tube secured with: Tape Dental Injury: Teeth and Oropharynx as per pre-operative assessment  Comments: DVL x 1 with view of arytenoids only.  Glidescope x 1 with clear view of vocal cords, atraumatic intubation, +/= BBS, +EtCO2.

## 2021-05-10 NOTE — Anesthesia Postprocedure Evaluation (Signed)
Anesthesia Post Note  Patient: Ryan [name].  Procedure(s) Performed: ROBOTIC PROXIMAL COLECTOMY WITH BILATERAL TAP BLOCK (Abdomen)     Patient location during evaluation: PACU Anesthesia Type: General Level of consciousness: awake and alert Pain management: pain level controlled Vital Signs Assessment: post-procedure vital signs reviewed and stable Respiratory status: spontaneous breathing, nonlabored ventilation, respiratory function stable and patient connected to nasal cannula oxygen Cardiovascular status: blood pressure returned to baseline and stable Postop Assessment: no apparent nausea or vomiting Anesthetic complications: no   No notable events documented.  Last Vitals:  Vitals:   05/10/21 1203 05/10/21 1316  BP: (!) 175/88 (!) 160/82  Pulse: 87 87  Resp: 16 16  Temp: 36.4 C 36.6 C  SpO2: 100% 96%    Last Pain:  Vitals:   05/10/21 1316  TempSrc: Oral  PainSc:                  Tiajuana Amass

## 2021-05-10 NOTE — Discharge Instructions (Signed)
SURGERY: POST OP INSTRUCTIONS (Surgery for small bowel obstruction, colon resection, etc)   ######################################################################  EAT Gradually transition to a high fiber diet with a fiber supplement over the next few days after discharge  WALK Walk an hour a day.  Control your pain to do that.    CONTROL PAIN Control pain so that you can walk, sleep, tolerate sneezing/coughing, go up/down stairs.  HAVE A BOWEL MOVEMENT DAILY Keep your bowels regular to avoid problems.  OK to try a laxative to override constipation.  OK to use an antidairrheal to slow down diarrhea.  Call if not better after 2 tries  CALL IF YOU HAVE PROBLEMS/CONCERNS Call if you are still struggling despite following these instructions. Call if you have concerns not answered by these instructions  ######################################################################   DIET Follow a light diet the first few days at home.  Start with a bland diet such as soups, liquids, starchy foods, low fat foods, etc.  If you feel full, bloated, or constipated, stay on a ful liquid or pureed/blenderized diet for a few days until you feel better and no longer constipated. Be sure to drink plenty of fluids every day to avoid getting dehydrated (feeling dizzy, not urinating, etc.). Gradually add a fiber supplement to your diet over the next week.  Gradually get back to a regular solid diet.  Avoid fast food or heavy meals the first week as you are more likely to get nauseated. It is expected for your digestive tract to need a few months to get back to normal.  It is common for your bowel movements and stools to be irregular.  You will have occasional bloating and cramping that should eventually fade away.  Until you are eating solid food normally, off all pain medications, and back to regular activities; your bowels will not be normal. Focus on eating a low-fat, high fiber diet the rest of your life  (See Getting to [place], below).  CARE of your INCISION or WOUND It is good for closed incision and even open wounds to be washed every day.  Shower every day.  Short baths are fine.  Wash the incisions and wounds clean with soap & water.     If you have a closed incision(s), wash the incision with soap & water every day.  You may leave closed incisions open to air if it is dry.   You may cover the incision with clean gauze & replace it after your daily shower for comfort.  It is good for closed incisions and even open wounds to be washed every day.  Shower every day.  Short baths are fine.  Wash the incisions and wounds clean with soap & water.    You may leave closed incisions open to air if it is dry.   You may cover the incision with clean gauze & replace it after your daily shower for comfort.  TEGADERM:  You have clear gauze band-aid dressings over your closed incision(s).  Remove the dressings 3 days after surgery.   If you have an open wound with a wound vac, see wound vac care instructions.     ACTIVITIES as tolerated Start light daily activities --- self-care, walking, climbing stairs-- beginning the day after surgery.  Gradually increase activities as tolerated.  Control your pain to be active.  Stop when you are tired.  Ideally, walk several times a day, eventually an hour a day.   Most people are back to most day-to-day activities in  a few weeks.  It takes 4-8 weeks to get back to unrestricted, intense activity. If you can walk 30 minutes without difficulty, it is safe to try more intense activity such as jogging, treadmill, bicycling, low-impact aerobics, swimming, etc. Save the most intensive and strenuous activity for last (Usually 4-8 weeks after surgery) such as sit-ups, heavy lifting, contact sports, etc.  Refrain from any intense heavy lifting or straining until you are off narcotics for pain control.  You will have off days, but things should improve  week-by-week. DO NOT PUSH THROUGH PAIN.  Let pain be your guide: If it hurts to do something, don't do it.  Pain is your body warning you to avoid that activity for another week until the pain goes down. You may drive when you are no longer taking narcotic prescription pain medication, you can comfortably wear a seatbelt, and you can safely make sudden turns/stops to protect yourself without hesitating due to pain. You may have sexual intercourse when it is comfortable. If it hurts to do something, stop.  MEDICATIONS Take your usually prescribed home medications unless otherwise directed.   Blood thinners:  Usually you can restart any strong blood thinners after the second postoperative day.  It is OK to take aspirin right away.     If you are on strong blood thinners (warfarin/Coumadin, Plavix, Xerelto, Eliquis, Pradaxa, etc), discuss with your surgeon, medicine PCP, and/or cardiologist for instructions on when to restart the blood thinner & if blood monitoring is needed (PT/INR blood check, etc).     PAIN CONTROL Pain after surgery or related to activity is often due to strain/injury to muscle, tendon, nerves and/or incisions.  This pain is usually short-term and will improve in a few months.  To help speed the process of healing and to get back to regular activity more quickly, DO THE FOLLOWING THINGS TOGETHER: Increase activity gradually.  DO NOT PUSH THROUGH PAIN Use Ice and/or Heat Try Gentle Massage and/or Stretching Take over the counter pain medication Take Narcotic prescription pain medication for more severe pain  Good pain control = faster recovery.  It is better to take more medicine to be more active than to stay in bed all day to avoid medications.  Increase activity gradually Avoid heavy lifting at first, then increase to lifting as tolerated over the next 6 weeks. Do not "push through" the pain.  Listen to your body and avoid positions and maneuvers than reproduce the pain.   Wait a few days before trying something more intense Walking an hour a day is encouraged to help your body recover faster and more safely.  Start slowly and stop when getting sore.  If you can walk 30 minutes without stopping or pain, you can try more intense activity (running, jogging, aerobics, cycling, swimming, treadmill, sex, sports, weightlifting, etc.) Remember: If it hurts to do it, then don't do it! Use Ice and/or Heat You will have swelling and bruising around the incisions.  This will take several weeks to resolve. Ice packs or heating pads (6-8 times a day, 30-60 minutes at a time) will help sooth soreness & bruising. Some people prefer to use ice alone, heat alone, or alternate between ice & heat.  Experiment and see what works best for you.  Consider trying ice for the first few days to help decrease swelling and bruising; then, switch to heat to help relax sore spots and speed recovery. Shower every day.  Short baths are fine.  It feels good!  Keep the incisions and wounds clean with soap & water.   Try Gentle Massage and/or Stretching Massage at the area of pain many times a day Stop if you feel pain - do not overdo it Take over the counter pain medication This helps the muscle and nerve tissues become less irritable and calm down faster Choose ONE of the following over-the-counter anti-inflammatory medications: Acetaminophen 578m tabs (Tylenol) 1-2 pills with every meal and just before bedtime (avoid if you have liver problems or if you have acetaminophen in you narcotic prescription) Naproxen 227mtabs (ex. Aleve, Naprosyn) 1-2 pills twice a day (avoid if you have kidney, stomach, IBD, or bleeding problems) Ibuprofen 20073mabs (ex. Advil, Motrin) 3-4 pills with every meal and just before bedtime (avoid if you have kidney, stomach, IBD, or bleeding problems) Take with food/snack several times a day as directed for at least 2 weeks to help keep pain / soreness down & more  manageable. Take Narcotic prescription pain medication for more severe pain A prescription for strong pain control is often given to you upon discharge (for example: oxycodone/Percocet, hydrocodone/Norco/Vicodin, or tramadol/Ultram) Take your pain medication as prescribed. Be mindful that most narcotic prescriptions contain Tylenol (acetaminophen) as well - avoid taking too much Tylenol. If you are having problems/concerns with the prescription medicine (does not control pain, nausea, vomiting, rash, itching, etc.), please call us [phone] see if we need to switch you to a different pain medicine that will work better for you and/or control your side effects better. If you need a refill on your pain medication, you must call the office before 4 pm and on weekdays only.  By federal law, prescriptions for narcotics cannot be called into a pharmacy.  They must be filled out on paper & picked up from our office by the patient or authorized caretaker.  Prescriptions cannot be filled after 4 pm nor on weekends.    WHEN TO CALL US [phone] uncontrolled or worsening pain  Fever over 101 F (38.5 C) Concerns with the incision: Worsening pain, redness, rash/hives, swelling, bleeding, or drainage Reactions / problems with new medications (itching, rash, hives, nausea, etc.) Nausea and/or vomiting Difficulty urinating Difficulty breathing Worsening fatigue, dizziness, lightheadedness, blurred vision Other concerns If you are not getting better after two weeks or are noticing you are getting worse, contact our office [phone] for further advice.  We may need to adjust your medications, re-evaluate you in the office, send you to the emergency room, or see what other things we can do to help. The clinic staff is available to answer your questions during regular business hours (8:30am-5pm).  Please don't hesitate to call and ask to speak to one of our nurses for clinical concerns.    A  sur[facility] from [facility] is always on call at the hospitals 24 hours/day If you have a medical emergency, go to the nearest emergency room or call 911.  FOLLOW UP in our office One the day of your discharge from the hospital (or the next business weekday), please call [facility] to set up or confirm an appointment to see your surgeon in the office for a follow-up appointment.  Usually it is 2-3 weeks after your surgery.   If you have skin staples at your incision(s), let the office know so we can set up a time in the office for the nurse to remove them (usually around 10 days after surgery). Make sure that you call for  appointments the day of discharge (or the next business weekday) from the hospital to ensure a convenient appointment time. IF YOU HAVE DISABILITY OR FAMILY LEAVE FORMS, BRING THEM TO THE OFFICE FOR PROCESSING.  DO NOT GIVE THEM TO YOUR DOCTOR.  [facility] [address] [place], [place], [place]  [address] ? [phone] - Main [phone] - [place],  [phone] - Fax [email or web address]  GETTING TO GOOD BOWEL HEALTH. It is expected for your digestive tract to need a few months to get back to normal.  It is common for your bowel movements and stools to be irregular.  You will have occasional bloating and cramping that should eventually fade away.  Until you are eating solid food normally, off all pain medications, and back to regular activities; your bowels will not be normal.   Avoiding constipation The goal: ONE SOFT BOWEL MOVEMENT A DAY!    Drink plenty of fluids.  Choose water first. TAKE A FIBER SUPPLEMENT EVERY DAY THE REST OF YOUR LIFE During your first week back home, gradually add back a fiber supplement every day Experiment which form you can tolerate.   There are many forms such as powders, tablets, wafers, gummies, etc Psyllium bran (Metamucil), methylcellulose (Citrucel), Miralax or Glycolax,  Benefiber, Flax Seed.  Adjust the dose week-by-week (1/2 dose/day to 6 doses a day) until you are moving your bowels 1-2 times a day.  Cut back the dose or try a different fiber product if it is giving you problems such as diarrhea or bloating. Sometimes a laxative is needed to help jump-start bowels if constipated until the fiber supplement can help regulate your bowels.  If you are tolerating eating & you are farting, it is okay to try a gentle laxative such as double dose MiraLax, prune juice, or Milk of Magnesia.  Avoid using laxatives too often. Stool softeners can sometimes help counteract the constipating effects of narcotic pain medicines.  It can also cause diarrhea, so avoid using for too long. If you are still constipated despite taking fiber daily, eating solids, and a few doses of laxatives, call our office. Controlling diarrhea Try drinking liquids and eating bland foods for a few days to avoid stressing your intestines further. Avoid dairy products (especially milk & ice cream) for a short time.  The intestines often can lose the ability to digest lactose when stressed. Avoid foods that cause gassiness or bloating.  Typical foods include beans and other legumes, cabbage, broccoli, and dairy foods.  Avoid greasy, spicy, fast foods.  Every person has some sensitivity to other foods, so listen to your body and avoid those foods that trigger problems for you. Probiotics (such as active yogurt, Align, etc) may help repopulate the intestines and colon with normal bacteria and calm down a sensitive digestive tract Adding a fiber supplement gradually can help thicken stools by absorbing excess fluid and retrain the intestines to act more normally.  Slowly increase the dose over a few weeks.  Too much fiber too soon can backfire and cause cramping & bloating. It is okay to try and slow down diarrhea with a few doses of antidiarrheal medicines.   Bismuth subsalicylate (ex. Kayopectate, Pepto Bismol)  for a few doses can help control diarrhea.  Avoid if pregnant.   Loperamide (Imodium) can slow down diarrhea.  Start with one tablet (47m) first.  Avoid if you are having fevers or severe pain.  ILEOSTOMY PATIENTS WILL HAVE CHRONIC DIARRHEA since their colon is  not in use.    Drink plenty of liquids.  You will need to drink even more glasses of water/liquid a day to avoid getting dehydrated. Record output from your ileostomy.  Expect to empty the bag every 3-4 hours at first.  Most people with a permanent ileostomy empty their bag 4-6 times at the least.   Use antidiarrheal medicine (especially Imodium) several times a day to avoid getting dehydrated.  Start with a dose at bedtime & breakfast.  Adjust up or down as needed.  Increase antidiarrheal medications as directed to avoid emptying the bag more than 8 times a day (every 3 hours). Work with your wound ostomy nurse to learn care for your ostomy.  See ostomy care instructions. TROUBLESHOOTING IRREGULAR BOWELS 1) Start with a soft & bland diet. No spicy, greasy, or fried foods.  2) Avoid gluten/wheat or dairy products from diet to see if symptoms improve. 3) Miralax 17gm or flax seed mixed in [place]. water or juice-daily. May use 2-4 times a day as needed. 4) Gas-X, Phazyme, etc. as needed for gas & bloating.  5) Prilosec (omeprazole) over-the-counter as needed 6)  Consider probiotics (Align, Activa, etc) to help calm the bowels down  Call your doctor if you are getting worse or not getting better.  Sometimes further testing (cultures, endoscopy, X-ray studies, CT scans, bloodwork, etc.) may be needed to help diagnose and treat the cause of the diarrhea. [facility], [place], [place], [place], [place]  [address] [phone] - Main.    [phone]  - Toll Free.   [phone] - Fax [email or web address]

## 2021-05-10 NOTE — Op Note (Signed)
05/10/2021  11:03 AM  PATIENT:  Ryan [name].  65 y.o. male  Patient Care Team: Debbrah Alar, NP as PCP - General (Internal Medicine) End, Harrell Gave, MD as PCP - Cardiology (Cardiology) Deneise Lever, MD as Consulting Physician (Pulmonary Disease) [name], MD as Consulting Physician (General Surgery) Irene Shipper, MD as Consulting Physician (Gastroenterology) Remi Haggard, MD as Consulting Physician (Urology)  PRE-OPERATIVE DIAGNOSIS:  NEUROENDOCRINE TUMOR OF ILEUM  POST-OPERATIVE DIAGNOSIS:  NEUROENDOCRINE TUMOR OF ILEUM  PROCEDURE:   ROBOTIC PROXIMAL COLECTOMY  TRANSVERSUS ABDOMINIS PLANE (TAP) BLOCK - BILATERAL  SURGEON:  Adin Hector, MD  ASSISTANT: Leighton Ruff, MD, FACS, FASCRS An experienced assistant was required given the standard of surgical care given the complexity of the case.  This assistant was needed for exposure, dissection, suction, tissue approximation, retraction, perception, etc.  ANESTHESIA:     General  Regional TRANSVERSUS ABDOMINIS PLANE (TAP) nerve block for perioperative & postoperative pain control provided with liposomal bupivacaine (Experel) mixed with 0.25% bupivacaine as a Bilateral TAP block x 76m each side at the level of the transverse abdominis & preperitoneal spaces along the flank at the anterior axillary line, from subcostal ridge to iliac crest under laparoscopic guidance   Local field block at port sites & extraction wound  EBL:  Total I/O In: 1520 [I.V.:1420; IV Piggyback:100] Out: 210 [Urine:200; Blood:10]  Delay start of Pharmacological VTE agent (>24hrs) due to surgical blood loss or risk of bleeding:  no  DRAINS: none   SPECIMEN: Distal ileum to proximal transverse colon.  DISPOSITION OF SPECIMEN:  PATHOLOGY  COUNTS:  YES  PLAN OF CARE: Admit to inpatient   PATIENT DISPOSITION:  PACU - hemodynamically stable.  INDICATION:    Patient found to have an ileal mass on CAT scan.  Biopsy  concerning for neuroendocrine tumor by gastroenterology.  I recommended segmental resection:  The anatomy & physiology of the digestive tract was discussed.  The pathophysiology was discussed.  Natural history risks without surgery was discussed.   I worked to give an overview of the disease and the frequent need to have multispecialty involvement.  I feel the risks of no intervention will lead to serious problems that outweigh the operative risks; therefore, I recommended a partial colectomy to remove the pathology.  Laparoscopic & open techniques were discussed.   Risks such as bleeding, infection, abscess, leak, reoperation, possible ostomy, hernia, heart attack, death, and other risks were discussed.  I noted a good likelihood this will help address the problem.   Goals of post-operative recovery were discussed as well.  We will work to minimize complications.  An educational handout on the pathology was given as well.  Questions were answered.    The patient expresses understanding & wishes to proceed with surgery.  OR FINDINGS:   CASE DATA:  Type of patient?: Elective [facility] Private Case  Status of Case? Elective Scheduled  Infection Present At Time Of Surgery (PATOS)?  NO  Patient had spherical mass in very distal ileum near the ileocecal valve correlating with the suspicious mass by CAT scan and colonoscopy.  No obvious metastatic disease on visceral parietal peritoneum or liver.  It is an isoperistaltic ileocolonic anastomosis that rests in the epigastric region.  DESCRIPTION:   Informed consent was confirmed.  The patient underwent general anaesthesia without difficulty.  The patient was positioned with arms tucked & secured appropriately.  VTE prevention in place.  The patient's abdomen was clipped, prepped, & draped in a sterile  fashion.  Surgical timeout confirmed our plan.  The patient was positioned in reverse Trendelenburg.  Abdominal entry was gained using Varess technique at  the left subcostal ridge on the anterior abdominal wall.  No elevated EtCO2 noted.  Port placed.  Camera inspection revealed no injury.  Extra ports were carefully placed under direct laparoscopic visualization.  We docked the [employer] Vinci robot carefully and placed intstruments under visualization  I mobilized & reflected the greater omentum and small bowel in the upper abdomen.   I was able to elevate the proximal colon to isolate the ileocolonic pedicle.  I scored the ileal mesentery just proximal to that.   I carried that further dissection in a medial to lateral fashion.  I was able to bluntly get into the retro-mesenteric plane on the right side.  I freed the proximal right sided colonic mesentery off the retroperitoneum including the duodenal sweep, pancreatic head, & Gerota's fascia of the right kidney. I was able to get underneath the hepatic flexure.  I was able to get underneath the proximal and mid transverse colon.  I isolated the proximal ileocecal pedicle.  I skeletonized it & transected the vessels.    I then proceeded to mobilize the terminal ileum & proximal "right" colon in a lateral to medial fashion.  I mobilized the distal ileal mesentery off its retroperitoneal and pelvic attachments.  I mobilized the ascending colon off It is side wall attachments to the paracolic gutter and retroperitoneum.  I also mobilized the greater omentum off the mid transverse colon and mobilized the mid to proximal transverse colon in a superior to inferior fashion.  This allowed me to mobilize the hepatic flexure and get a complete mobilization of the proximal "right" colon to the mid-transverse colon..  I could isolate the pathology. When he went ahead and proceeded with transection.  I transected the distal ileal mesentery and then transected at the distal ileum with a robotic stapler 54m blue load.  I then transected transverse colon mesentery just proximal to a dominant middle colic arterial pedicle  radially.  Transected at the proximal transverse colon with a robotic stapler.  We assured hemostasis.   I did a side-to-side stapled anastomosis of ileum to mid-transverse colon using a 657mwhite load in an isoperistaltic fashion.  (Distal stump of ileum to mid transverse colon for the distal end of the anastomosis.  Proximal end of colon stump to more proximal ileum for the proximal end of the anastomosis).  I sewed the common staple channel wound with an absorbable suture ( 2-0 V-lock) in a running CoHersheyashion from each corner and meeting in the center.  We did meticulous inspection prove an airtight closure.  I protected the anastomosis line with an anterior omentopexy of greater omentum using V lock suture.  We did reinspection of the abdomen.  Hemostasis was good.  Ureters, retroperitoneum, and bowel uninjured.  The anastomosis looked healthy.   Endoluminal gas was evacuated.  We placed the wound protector through the suprapubic 1237mort site after it was enlarged in a Pfannenstiel fashion.  Specimen removed without incident.   Ports & wound protector removed.  Hemostasis was good.  Sterile unused instruments were used from this point.  I closed the skin at the port sites using Monocryl stitch and sterile dressing.  I closed the extraction wound using a 0 Vicryl vertical peritoneal closure and a #1 PDS transverse anterior rectal fascial closure like a small Pfannenstiel closure. I closed the skin with some  interrupted Monocryl stitches. I placed antibiotic-soaked wicks into the closure at the corners x2.  I placed sterile dressings.     Patient is being extubated go to recovery room. I discussed postop care with the patient in detail the office & in the holding area. Instructions are written. I made an attempt to locate & reach the desired patient contact to discuss the patient's overall status and my recommendations.  No one is available at this time.  My plans & instructions have been written in  the chart.  We will try and reach his wife again later   Adin Hector, M.D., F.A.C.S. Gastrointestinal and Minimally Invasive Surgery [facility] [address] [place], [place] [address] [phone] Main / Paging

## 2021-05-10 NOTE — H&P (Signed)
05/10/2021    REFERRING PHYSICIAN: Dr Henrene Pastor  Patient Care Team: Nance Pear, NP as PCP - General (Family Medicine) Johney Maine, Adrian Saran, MD as Consulting Provider (General Surgery) Eustace Quail., MD (Gastroenterology) End, Harrell Gave, MD (Cardiovascular Disease)  PROVIDER: Hollace Kinnier, MD  [facility] MRN: [id number] DOB: 1956-03-18 DATE OF ENCOUNTER: 03/29/2021  Subjective   Chief Complaint: Ileal mass  History of Present Illness: Ryan [name] is a 65 y.o. male who is seen today as an office consultation at the request of Dr. Henrene Pastor for evaluation of mass of ileum. Probable neuroendocrine carcinoid tumor.   Pleasant gentleman noted some bleeding in his urine. Saw urology. Dr. Milford Cage ordered a CAT scan. Right kidney stone noted. Small lung nodule. Suspicious nodule at his ileum noted. Colonoscopy done which confirmed a submucosal mass in the ileocecal region. Suspicious for and biopsies confirming neuroendocrine carcinoid tumor. No adenomatous polyps or cancers. Colon otherwise underwhelming. Dr. Henrene Pastor requested surgical evaluation.  Patient comes today with his wife. He denies any bowel changes. Usually moves his bowels every day. He smokes a few cigars a week. Never had any abdominal surgery. He is rather active and walk a couple miles without difficulty. He did have some cardiac issues but is just on aspirin. Followed by Dr. Harrell Gave and with cardiology. He had an echocardiogram last year that showed good ejection fraction. He does have sleep apnea but wears CPAP. He is not a diabetic.  cigar smoker, OSA, complicated by Blind L eye (congenital), CAD/ angina, Diastolic Dysfunction, HTN, Fatty Liver, GERD, Hyperlipidemia, Raynaud's, Tobacco use, Diastolic Dysfunction/ edema,  Medical History: Past Medical History:  Diagnosis Date   GERD (gastroesophageal reflux disease)   Hyperlipidemia   Hypertension   Kidney stones   Sleep apnea   There is no problem list  on file for this patient.  Past Surgical History:  Procedure Laterality Date   TONSILLECTOMY N/A    Allergies  Allergen Reactions   Atorvastatin Other (See Comments)  Hands swelled Hands swelled   Adhesive Tape-Silicones Unknown  Caused redness   Current Outpatient Medications on File Prior to Visit  Medication Sig Dispense Refill   losartan (COZAAR) 25 MG tablet Take 1 tablet by mouth once daily   docosahexaenoic acid-epa 120-180 mg Cap Take by mouth   No current facility-administered medications on file prior to visit.   Family History  Problem Relation Age of Onset   Obesity Mother   High blood pressure (Hypertension) Mother   Diabetes Mother   Colon cancer Mother   Myocardial Infarction (Heart attack) Father    Social History   Tobacco Use  Smoking Status Current Every Day Smoker   Types: Cigars  Smokeless Tobacco Never Used    Social History   Socioeconomic History   Marital status: Married  Tobacco Use   Smoking status: Current Every Day Smoker  Types: Cigars   Smokeless tobacco: Never Used  Substance and Sexual Activity   Alcohol use: Yes   Drug use: Never   ############################################################  Fraility Risk:  Preoperative Risks/Screening 1. Frailty Review:  Lives independently? yes Uses a mobility assist device (cane/walker/wheelchair)? no History of falls within 3 months? no Cognitive impairment/dementia? no Age > 65? no  2. Nutrition Screening: Cancer/IBD? no Age >65? no Weight loss >10% in past 6 months? no If yes to any of the 3 above, consider Impact AR supplemental shake  3. PONV Screening: History of PONV? no Male under age of 76? no History of motion sickness?  no  4. Chronic pain issues? no  5. Diabetes? no Last HgbA1c: No results found for: HGBA1C  Review of Systems: A complete review of systems (ROS) was obtained from the patient. I have reviewed this information and discussed as appropriate  with the patient. See HPI as well for other pertinent ROS.  Constitutional: No fevers, chills, sweats. Weight stable Eyes: No vision changes, No discharge HENT: No sore throats, nasal drainage Lymph: No neck swelling, No bruising easily Pulmonary: No cough, productive sputum CV: No orthopnea, PND Patient walks 60 minutes for about 2 miles without difficulty. No exertional chest/neck/shoulder/arm pain.  GI: No personal nor family history of GI/colon cancer, inflammatory bowel disease, irritable bowel syndrome, allergy such as Celiac Sprue, dietary/dairy problems, colitis, ulcers nor gastritis. No recent sick contacts/gastroenteritis. No travel outside the country. No changes in diet.  Renal: No UTIs, No hematuria Genital: No drainage, bleeding, masses Musculoskeletal: No severe joint pain. Good ROM major joints Skin: No sores or lesions Heme/Lymph: No easy bleeding. No swollen lymph nodes  Objective:   Vitals:  03/29/21 1421  BP: (!) 142/78  Pulse: 76  Temp: 36.7 C (98 F)  SpO2: 98%  Weight: (!) 102.6 kg (226 lb 3.2 oz)  Height: 175.3 cm ($RemoveB'5\' 9"'WhkSpEuN$ )    Body mass index is 33.4 kg/m.  PHYSICAL EXAM:  Constitutional: Not cachectic. Hygeine adequate. Vitals signs as above.  Eyes: Pupils reactive, normal extraocular movements. Sclera nonicteric. He is blind out his left eye Neuro: CN II-XII intact. No major focal sensory defects. No major motor deficits. Lymph: No head/neck/groin lymphadenopathy Psych: No severe agitation. No severe anxiety. Judgment & insight Adequate, Oriented x4, HENT: Normocephalic, Mucus membranes moist. No thrush.  Neck: Supple, No tracheal deviation. No obvious thyromegaly Chest: No pain to chest wall compression. Good respiratory excursion. No audible wheezing CV: Pulses intact. Regular rhythm. No major extremity edema  Abdomen: Flat Hernia: Present at: Umbilicus. Few millimeters wide. Reducible.. Diastasis recti: Mild supraumbilical midline. Soft.  Nondistended. Nontender. No hepatomegaly. No splenomegaly  Gen: Inguinal hernia: Not present. Inguinal lymph nodes: without lymphadenopathy.   Rectal: (Deferred)  Ext: No obvious deformity or contracture. Edema: Not present. No cyanosis Skin: No major subcutaneous nodules. Warm and dry Musculoskeletal: Severe joint rigidity not present. No obvious clubbing. No digital petechiae.   Labs, Imaging and Diagnostic Testing:  Located in [place]' section of Epic EMR chart  PRIOR NOTES   Not applicable  SURGERY NOTES:  Not applicable  PATHOLOGY:  Diagnosis Surgical [P], ileal nodule biopsies - WELL-DIFFERENTIATED NEUROENDOCRINE TUMOR, GRADE 1 - SEE COMMENT Microscopic Comment By immunohistochemistry, the neoplastic cells are positive for chromogranin synaptic ficin and weakly positive for CD56. The proliferative rate by Ki-67 is low (<1%). Dr. Vic Ripper reviewed the case and agrees with the above diagnosis. Thressa Sheller MD Pathologist, Electronic Signature (Case signed 02/23/2021)  Assessment and Plan:  DIAGNOSES:  Diagnoses and all orders for this visit:  Benign neuroendocrine neoplasm of ileum - [facility] Case Posting Request; Future  OSA on CPAP  Right lower lobe pulmonary nodule  Smokes cigars  Renal calculus, right  Umbilical hernia without obstruction and without gangrene  Other orders - polyethylene glycol (MIRALAX) powder; Take 233.75 g by mouth once for 1 dose Take according to your procedure prep instructions. - bisacodyL (DULCOLAX) 5 mg EC tablet; Take 4 tablets (20 mg total) by mouth once daily as needed for Constipation for up to 1 dose - metroNIDAZOLE (FLAGYL) 500 MG tablet; Take 1 tablet (500 mg total)  by mouth 3 (three) times daily for 3 doses Take according to your procedure colon prep instructions - neomycin 500 mg tablet; Take 2 tablets (1,000 mg total) by mouth 3 (three) times daily for 3 doses Take according to your procedure colon prep  instructions    ASSESSMENT/PLAN  Incidental ileal mass on CAT scan with biopsy consistent with neuroendocrine carcinoid type tumor. No evidence of any adenocarcinoma. Standard of care would be segmental resection. Good candidate for robotic intracorporeal anastomosis proximal right colectomy. Discussed the technique risk benefits. He and his wife are interested in proceeding  The anatomy & physiology of the digestive tract was discussed. The pathophysiology of the colon was discussed. Natural history risks without surgery was discussed. I feel the risks of no intervention will lead to serious problems that outweigh the operative risks; therefore, I recommended a partial colectomy to remove the pathology. Minimally invasive (Robotic/Laparoscopic) & open techniques were discussed.   Risks such as bleeding, infection, abscess, leak, reoperation, injury to other organs, need for repair of tissues / organs, possible ostomy, hernia, heart attack, stroke, death, and other risks were discussed. I noted a good likelihood this will help address the problem. Goals of post-operative recovery were discussed as well. Need for adequate nutrition, daily bowel regimen and healthy physical activity, to optimize recovery was noted as well. We will work to minimize complications. Educational materials were available as well. Questions were answered. The patient expresses understanding & wishes to proceed with surgery.   I will defer to urology on timing of lithotripsy or any other interventions for his kidney stones. Since I get from the patient is that Dr. Milford Cage wanted to hold off until the colon mass is addressed first.  He has pretty good exercise tolerance but I think the safest thing to do would be to proceed with cardiac clearance since he saw Dr. Saunders Revel for some cardiac issues last year. Echocardiogram seems okay so most likely it could be just a quick evaluation. See what Dr. Saunders Revel thinks.  FOLLOWUP: Surgery  today  ########################################################  Adin Hector, MD, FACS, MASCRS Esophageal, Gastrointestinal & Colorectal Surgery Robotic and Minimally Invasive Surgery  [facility], [place]  [place]. [address] [place], [place] [address] [phone] Fax [phone] Main  CONTACT INFORMATION:  Weekday (9AM-5PM): Call [facility] main office at [phone]  Weeknight (5PM-9AM) or Weekend/Holiday: Check [email or web address] (password " [email or web address]") for General Surgery [facility] coverage  (Please, do not use SecureChat as it is not reliable communication to operating surgeons for immediate patient care)           Note: Portions of this report may have been transcribed using voice recognition software. Every effort was made to ensure accuracy; however, inadvertent computerized transcription errors may be present. Any transcriptional errors that result from this process are unintentional.

## 2021-05-11 LAB — CBC
HCT: 40.5 % (ref 39.0–52.0)
Hemoglobin: 14.2 g/dL (ref 13.0–17.0)
MCH: 30.2 pg (ref 26.0–34.0)
MCHC: 35.1 g/dL (ref 30.0–36.0)
MCV: 86.2 fL (ref 80.0–100.0)
Platelets: 182 10*3/uL (ref 150–400)
RBC: 4.7 MIL/uL (ref 4.22–5.81)
RDW: 11.5 % (ref 11.5–15.5)
WBC: 15.7 10*3/uL — ABNORMAL HIGH (ref 4.0–10.5)
nRBC: 0 % (ref 0.0–0.2)

## 2021-05-11 LAB — BASIC METABOLIC PANEL
Anion gap: 6 (ref 5–15)
BUN: 13 mg/dL (ref 8–23)
CO2: 25 mmol/L (ref 22–32)
Calcium: 9.1 mg/dL (ref 8.9–10.3)
Chloride: 105 mmol/L (ref 98–111)
Creatinine, Ser: 1.04 mg/dL (ref 0.61–1.24)
GFR, Estimated: >60 mL/min (ref >60)
Glucose, Bld: 154 mg/dL — ABNORMAL HIGH (ref 70–99)
Potassium: 4.6 mmol/L (ref 3.5–5.1)
Sodium: 136 mmol/L (ref 135–145)

## 2021-05-11 LAB — MAGNESIUM: Magnesium: 2 mg/dL (ref 1.7–2.4)

## 2021-05-11 MED ORDER — TRAMADOL HCL 50 MG PO TABS: 50.0000 mg | ORAL_TABLET | Freq: Four times a day (QID) | ORAL | 0 refills | Status: DC | PRN

## 2021-05-11 NOTE — Progress Notes (Addendum)
Ryan [name] [id number] 11-Mar-1956  CARE TEAM:  PCP: Debbrah Alar, NP  Outpatient Care Team: Patient Care Team: Debbrah Alar, NP as PCP - General (Internal Medicine) End, Harrell Gave, MD as PCP - Cardiology (Cardiology) Deneise Lever, MD as Consulting Physician (Pulmonary Disease) [name], MD as Consulting Physician (General Surgery) Irene Shipper, MD as Consulting Physician (Gastroenterology) Remi Haggard, MD as Consulting Physician (Urology)  Inpatient Treatment Team: Treatment Team: Attending Provider: [name], MD; Student Nurse: Kirtland Bouchard; Utilization Review: Fortino Sic, RN; Registered Nurse: Steward Ros, RN; Pharmacist: Emiliano Dyer, [facility]; Social Worker: Sherie Don, [place]   Problem List:   Principal Problem:   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 Elective Scheduled   Infection Present At Time Of Surgery (PATOS)?  NO   Patient had spherical mass in very distal ileum near the ileocecal valve correlating with the suspicious mass by CAT scan and colonoscopy.   No obvious metastatic disease on visceral parietal peritoneum or liver.   It is an isoperistaltic ileocolonic anastomosis that rests in the epigastric region.  Assessment  Recovering well so far  [facility] Stay = 1 days)  Plan:  ERAS protocol Medlock IV fluids.  As needed bolus for backup Follow-up on  pathology Advance diet. Weaned off oxygen.  CPAP for sleep apnea PPI for GERD Hypertension.  Resuming losartan.  As needed backup. History of heart failure but good exercise tolerance.  Keep on the dry side. -VTE prophylaxis- SCDs, etc -mobilize as tolerated to help recovery  Disposition:  Disposition:  The patient is from: Home  Anticipate discharge to:  Home  Anticipated Date of Discharge is:  September 16,2022    Barriers to discharge:  Pending Clinical improvement (more likely than not)  Patient currently is NOT MEDICALLY STABLE for discharge from the hospital from a surgery standpoint.      20 minutes spent in review, evaluation, examination, counseling, and coordination of care.   I have reviewed this patient's available data, including medical history, events of note, physical examination and test results as part of my evaluation.  A significant portion of that time was spent in counseling.  Care during the described time interval was provided by me.  05/11/2021    Subjective: (Chief complaint)  Patient walked in hallways.  Mild soreness control.  Wife at bedside.  Hungry.  Tolerated liquids.  Objective:  Vital signs:  Vitals:   05/10/21 2133 05/11/21 0145 05/11/21 0547 05/11/21 0920  BP: 136/73 (!) 142/73 (!) 148/80 (!) 141/70  Pulse: 85 73 79 76  Resp: '16 16 16 16  '$ Temp: 98.5 F (36.9 C)  98 F (36.7 C) 99 F (37.2 C)  TempSrc: Oral  Oral Oral  SpO2: 94% 96% 97%   Weight:      Height:        Last BM Date: 05/09/21  Intake/Output   Yesterday:  09/14 0701 - 09/15 0700  In: 2402.5 [I.V.:2302.5; IV Piggyback:100] Out: 1900 [Urine:1890; Blood:10] This shift:  No intake/output data recorded.  Bowel function:  Flatus: No  BM:  No  Drain: (No drain)   Physical Exam:  General: Pt awake/alert in no acute distress.  Sitting up in bed smiling. Eyes: PERRL, normal EOM.  Sclera clear.  No icterus Neuro: CN II-XII intact w/o focal  sensory/motor deficits. Lymph: No head/neck/groin lymphadenopathy Psych:  No delerium/psychosis/paranoia.  Oriented x 4 HENT: Normocephalic, Mucus membranes moist.  No thrush Neck: Supple, No tracheal deviation.  No obvious thyromegaly Chest: No pain to chest wall compression.  Good respiratory excursion.  No audible wheezing CV:  Pulses intact.  Regular rhythm.  No major extremity edema MS: Normal AROM mjr joints.  No obvious deformity  Abdomen: Soft.  Nondistended.  Nontender.  Dressings clean dry and intact no evidence of peritonitis.  No incarcerated hernias.  Ext:   No deformity.  No mjr edema.  No cyanosis Skin: No petechiae / purpurea.  No major sores.  Warm and dry    Results:   Cultures: Recent Results (from the past 720 hour(s))  SARS Coronavirus 2 (TAT 6-24 hrs)     Status: None   Collection Time: 05/08/21 12:00 AM  Result Value Ref Range Status   SARS Coronavirus 2 RESULT: NEGATIVE  Final    Comment: RESULT: NEGATIVESARS-CoV-2 INTERPRETATION:A NEGATIVE  test result means that SARS-CoV-2 RNA was not present in the specimen above the limit of detection of this test. This does not preclude a possible SARS-CoV-2 infection and should not be used as the  sole basis for patient management decisions. Negative results must be combined with clinical observations, patient history, and epidemiological information. Optimum specimen types and timing for peak viral levels during infections caused by SARS-CoV-2  have not been determined. Collection of multiple specimens or types of specimens may be necessary to detect virus. Improper specimen collection and handling, sequence variability under primers/probes, or organism present below the limit of detection may  lead to false negative results. Positive and negative predictive values of testing are highly dependent on prevalence. False negative test results are more likely when prevalence of disease is high.The expected result is NEGATIVE.Fact  S heet for  Healthcare Providers: LocalChronicle.no Sheet for Patients: SalonLookup.es Reference Range - Negative     Labs: Results for orders placed or performed during the hospital encounter of 05/10/21 (from the past 48 hour(s))  Type and screen Michie     Status: None   Collection Time: 05/10/21  6:10 AM  Result Value Ref Range   ABO/RH(D) O POS    Antibody Screen NEG    Sample Expiration      05/13/2021,2359 Performed at [facility], [place] [address], [place], [place] [address]   ABO/Rh     Status: None   Collection Time: 05/10/21  6:50 AM  Result Value Ref Range   ABO/RH(D)      O POS Performed at [facility], [place] [address], [place], [place] [id number]   Basic metabolic panel     Status: Abnormal   Collection Time: 05/11/21  4:19 AM  Result Value Ref Range   Sodium 136 135 - 145 mmol/L   Potassium 4.6 3.5 - 5.1 mmol/L   Chloride 105 98 - 111 mmol/L   CO2 25 22 - 32 mmol/L   Glucose, Bld 154 (H) 70 - 99 mg/dL    Comment: Glucose reference range applies only to samples  taken after fasting for at least 8 hours.   BUN 13 8 - 23 mg/dL   Creatinine, Ser 1.04 0.61 - 1.24 mg/dL   Calcium 9.1 8.9 - 10.3 mg/dL   GFR, Estimated >60 >60 mL/min    Comment: (NOTE) Calculated using the CKD-EPI Creatinine Equation (2021)    Anion gap 6 5 - 15    Comment: Performed at [facility], [place] [address], [place], [place] [address]  CBC     Status: Abnormal   Collection Time: 05/11/21  4:19 AM  Result Value Ref Range   WBC 15.7 (H) 4.0 - 10.5 K/uL   RBC 4.70 4.22 - 5.81 MIL/uL   Hemoglobin 14.2 13.0 - 17.0 g/dL   HCT 40.5 39.0 - 52.0 %   MCV 86.2 80.0 - 100.0 fL   MCH 30.2 26.0 - 34.0 pg   MCHC 35.1 30.0 - 36.0 g/dL   RDW 11.5 11.5 - 15.5 %   Platelets 182 150 - 400 K/uL   nRBC 0.0 0.0 - 0.2 %    Comment: Performed at [facility], [place] [address] [place], [place] [address]  Magnesium     Status: None   Collection Time: 05/11/21  4:19 AM  Result Value Ref Range   Magnesium 2.0 1.7 - 2.4 mg/dL    Comment: Performed at [facility], [place] [address], [place], [place] [address]    Imaging / Studies: No results found.  Medications / Allergies: per chart  Antibiotics: Anti-infectives (From admission, onward)    Start     Dose/Rate Route Frequency Ordered Stop   05/10/21 2000  cefoTEtan (CEFOTAN) 2 g in sodium chloride 0.9 % 100 mL IVPB        2 g 200 mL/hr over 30 Minutes Intravenous Every 12 hours 05/10/21 1125 05/10/21 2039   05/10/21 1400  neomycin (MYCIFRADIN) tablet 1,000 mg  Status:  Discontinued       See Hyperspace for full Linked Orders Report.   1,000 mg Oral 3 times per day 05/10/21 0609 05/10/21 0611   05/10/21 1400  metroNIDAZOLE (FLAGYL) tablet 1,000 mg  Status:  Discontinued       See Hyperspace for full Linked Orders Report.   1,000 mg Oral 3 times per day 05/10/21 0609 05/10/21 0611   05/10/21 0615  cefoTEtan (CEFOTAN) 2 g in sodium chloride 0.9 % 100 mL IVPB        2 g 200 mL/hr over 30 Minutes Intravenous On call to O.R. 05/10/21 [id number] 05/10/21 0834         Note: Portions of this report may have been transcribed using voice recognition software. Every effort was made to ensure accuracy; however, inadvertent computerized transcription errors may be present.   Any transcriptional errors that result from this process are unintentional.    Adin Hector, MD, FACS, MASCRS Esophageal, Gastrointestinal & Colorectal Surgery Robotic and Minimally Invasive Surgery  [facility], [place]  [place]. [address] [place], [place] [address] [phone] Fax [phone] Main  CONTACT INFORMATION:  Weekday (9AM-5PM): Call [facility] main office at [phone]  Weeknight (5PM-9AM) or Weekend/Holiday: Check  [email or web address] (password " [email or web address]") for General Surgery [facility] coverage  (Please, do not use SecureChat as it is not reliable communication to operating surgeons for immediate patient care)      05/11/2021  10:06 AM

## 2021-05-12 NOTE — Progress Notes (Signed)
Pt alert and oriented, tolerating diet. D/C instructions given, pt d/cd to home. 

## 2021-05-12 NOTE — Discharge Summary (Signed)
Physician Discharge Summary  Patient ID: Ryan [name]. MRN: [id number] DOB/AGE: Jan 12, 1956 65 y.o.  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  His diet was advanced as tolerated.  By postop day 2 he was tolerating a diet and having bowel function.  He was ambulating without difficulty and his pain was controlled with Tylenol.  He was felt to be in stable condition for discharge to home.  Consults: None  Significant Diagnostic Studies: labs: cbc, bmet  Treatments: IV hydration, analgesia: acetaminophen, and surgery: robotic R colectomy  Discharge Exam: Blood pressure 128/71, pulse 64, temperature 98.4 F (36.9 C), temperature source Oral, resp. rate 16, height '5\' 9"'$  (1.753 m), weight 103.5 kg, SpO2 97 %. General appearance: alert and cooperative GI: soft, non-tender; bowel sounds normal; no masses,  no organomegaly Incision/Wound: clean, dry, intact  Disposition: home  Discharge Instructions     Call MD for:   Complete by: As directed    FEVER > 101.5 F  (temperatures < 101.5 F are not significant)   Call MD for:   Complete by: As directed    FEVER > 101.5 F  (temperatures < 101.5 F are not significant)   Call MD for:  extreme fatigue   Complete by: As directed    Call MD for:  extreme fatigue   Complete by: As directed    Call MD for:  persistant dizziness or light-headedness   Complete by: As directed    Call MD for:  persistant dizziness or light-headedness   Complete by: As directed    Call MD for:  persistant nausea and vomiting   Complete by: As  directed    Call MD for:  persistant nausea and vomiting   Complete by: As directed    Call MD for:  redness, tenderness, or signs of infection (pain, swelling, redness, odor or green/yellow discharge around incision site)   Complete by: As directed    Call MD for:  redness, tenderness, or signs of infection (pain, swelling, redness, odor or green/yellow discharge around incision site)   Complete by: As directed    Call MD for:  severe uncontrolled pain   Complete by: As directed    Call MD for:  severe uncontrolled pain   Complete by: As directed    Diet - low sodium heart healthy   Complete by: As directed    Start with a bland diet such as soups, liquids, starchy foods, low fat foods, etc. the first few days at home. Gradually advance to a solid, low-fat, high fiber diet by the end of the first week at home.   Add a fiber supplement to your diet (Metamucil, etc) If you feel full, bloated, or constipated, stay on a full liquid or pureed/blenderized diet for a few days until you feel better and are no longer constipated.   Diet - low sodium heart healthy   Complete by: As directed    Start with a bland diet such as soups, liquids, starchy foods, low fat foods, etc. the first few days at home. Gradually advance to a solid, low-fat, high fiber diet by the end  of the first week at home.   Add a fiber supplement to your diet (Metamucil, etc) If you feel full, bloated, or constipated, stay on a full liquid or pureed/blenderized diet for a few days until you feel better and are no longer constipated.   Discharge instructions   Complete by: As directed    See Discharge Instructions If you are not getting better after two weeks or are noticing you are getting worse, contact our office [phone] for further advice.  We may need to adjust your medications, re-evaluate you in the office, send you to the emergency room, or see what other things we can do to help. The clinic staff is available  to answer your questions during regular business hours (8:30am-5pm).  Please don't hesitate to call and ask to speak to one of our nurses for clinical concerns.    A surgeon from [facility] is always on call at the hospitals 24 hours/day If you have a medical emergency, go to the nearest emergency room or call 911.   Discharge instructions   Complete by: As directed    See Discharge Instructions If you are not getting better after two weeks or are noticing you are getting worse, contact our office [phone] for further advice.  We may need to adjust your medications, re-evaluate you in the office, send you to the emergency room, or see what other things we can do to help. The clinic staff is available to answer your questions during regular business hours (8:30am-5pm).  Please don't hesitate to call and ask to speak to one of our nurses for clinical concerns.    A surgeon from [facility] is always on call at the hospitals 24 hours/day If you have a medical emergency, go to the nearest emergency room or call 911.   Discharge wound care:   Complete by: As directed    It is good for closed incisions and even open wounds to be washed every day.  Shower every day.  Short baths are fine.  Wash the incisions and wounds clean with soap & water.    You may leave closed incisions open to air if it is dry.   You may cover the incision with clean gauze & replace it after your daily shower for comfort.  TEGADERM:  You have clear gauze band-aid dressings over your closed incision(s).  Remove the dressings 3 days after surgery 9/17 Saturday.   Discharge wound care:   Complete by: As directed    It is good for closed incisions and even open wounds to be washed every day.  Shower every day.  Short baths are fine.  Wash the incisions and wounds clean with soap & water.    You may leave closed incisions open to air if it is dry.   You may cover the incision with clean gauze & replace  it after your daily shower for comfort.  TEGADERM:  You have clear gauze band-aid dressings over your closed incision(s).  Remove the dressings 3 days after surgery.   Driving Restrictions   Complete by: As directed    You may drive when: - you are no longer taking narcotic prescription pain medication - you can comfortably wear a seatbelt - you can safely make sudden turns/stops without pain.   Driving Restrictions   Complete by: As directed    You may drive when: - you are no longer taking narcotic prescription pain medication - you can comfortably wear  a seatbelt - you can safely make sudden turns/stops without pain.   Increase activity slowly   Complete by: As directed    Start light daily activities --- self-care, walking, climbing stairs- beginning the day after surgery.  Gradually increase activities as tolerated.  Control your pain to be active.  Stop when you are tired.  Ideally, walk several times a day, eventually an hour a day.   Most people are back to most day-to-day activities in a few weeks.  It takes 4-6 weeks to get back to unrestricted, intense activity. If you can walk 30 minutes without difficulty, it is safe to try more intense activity such as jogging, treadmill, bicycling, low-impact aerobics, swimming, etc. Save the most intensive and strenuous activity for last (Usually 4-8 weeks after surgery) such as sit-ups, heavy lifting, contact sports, etc.  Refrain from any intense heavy lifting or straining until you are off narcotics for pain control.  You will have off days, but things should improve week-by-week. DO NOT PUSH THROUGH PAIN.  Let pain be your guide: If it hurts to do something, don't do it.   Increase activity slowly   Complete by: As directed    Start light daily activities --- self-care, walking, climbing stairs- beginning the day after surgery.  Gradually increase activities as tolerated.  Control your pain to be active.  Stop when you are tired.  Ideally,  walk several times a day, eventually an hour a day.   Most people are back to most day-to-day activities in a few weeks.  It takes 4-6 weeks to get back to unrestricted, intense activity. If you can walk 30 minutes without difficulty, it is safe to try more intense activity such as jogging, treadmill, bicycling, low-impact aerobics, swimming, etc. Save the most intensive and strenuous activity for last (Usually 4-8 weeks after surgery) such as sit-ups, heavy lifting, contact sports, etc.  Refrain from any intense heavy lifting or straining until you are off narcotics for pain control.  You will have off days, but things should improve week-by-week. DO NOT PUSH THROUGH PAIN.  Let pain be your guide: If it hurts to do something, don't do it.   Lifting restrictions   Complete by: As directed    If you can walk 30 minutes without difficulty, it is safe to try more intense activity such as jogging, treadmill, bicycling, low-impact aerobics, swimming, etc. Save the most intensive and strenuous activity for last (Usually 4-8 weeks after surgery) such as sit-ups, heavy lifting, contact sports, etc.   Refrain from any intense heavy lifting or straining until you are off narcotics for pain control.  You will have off days, but things should improve week-by-week. DO NOT PUSH THROUGH PAIN.  Let pain be your guide: If it hurts to do something, don't do it.  Pain is your body warning you to avoid that activity for another week until the pain goes down.   Lifting restrictions   Complete by: As directed    If you can walk 30 minutes without difficulty, it is safe to try more intense activity such as jogging, treadmill, bicycling, low-impact aerobics, swimming, etc. Save the most intensive and strenuous activity for last (Usually 4-8 weeks after surgery) such as sit-ups, heavy lifting, contact sports, etc.   Refrain from any intense heavy lifting or straining until you are off narcotics for pain control.  You will  have off days, but things should improve week-by-week. DO NOT PUSH THROUGH PAIN.  Let pain be your  guide: If it hurts to do something, don't do it.  Pain is your body warning you to avoid that activity for another week until the pain goes down.   May shower / Bathe   Complete by: As directed    May shower / Bathe   Complete by: As directed    May walk up steps   Complete by: As directed    May walk up steps   Complete by: As directed    Remove dressing in 72 hours   Complete by: As directed    Make sure all dressings are removed by the third day after surgery.  Leave incisions open to air.  OK to cover incisions with gauze or bandages as desired   Remove dressing in 72 hours   Complete by: As directed    Make sure all dressings are removed by the third day after surgery.  Leave incisions open to air.  OK to cover incisions with gauze or bandages as desired   Sexual Activity Restrictions   Complete by: As directed    You may have sexual intercourse when it is comfortable. If it hurts to do something, stop.   Sexual Activity Restrictions   Complete by: As directed    You may have sexual intercourse when it is comfortable. If it hurts to do something, stop.      Allergies as of 05/12/2021       Reactions   Atorvastatin Other (See Comments)   Hands swelled   Tape    Caused redness        Medication List     TAKE these medications    aspirin EC 81 MG tablet Take 1 tablet (81 mg total) by mouth daily.   Fish Oil 1000 MG Caps Take 2,000 mg by mouth 2 (two) times daily.   losartan 25 MG tablet Commonly known as: COZAAR TAKE 1 TABLET BY MOUTH DAILY.   traMADol 50 MG tablet Commonly known as: ULTRAM Take 1-2 tablets (50-100 mg total) by mouth every 6 (six) hours as needed for moderate pain.               Discharge Care Instructions  (From admission, onward)           Start     Ordered   05/11/21 0000  Discharge wound care:       Comments: It is good for  closed incisions and even open wounds to be washed every day.  Shower every day.  Short baths are fine.  Wash the incisions and wounds clean with soap & water.    You may leave closed incisions open to air if it is dry.   You may cover the incision with clean gauze & replace it after your daily shower for comfort.  TEGADERM:  You have clear gauze band-aid dressings over your closed incision(s).  Remove the dressings 3 days after surgery.   05/11/21 1021   05/10/21 0000  Discharge wound care:       Comments: It is good for closed incisions and even open wounds to be washed every day.  Shower every day.  Short baths are fine.  Wash the incisions and wounds clean with soap & water.    You may leave closed incisions open to air if it is dry.   You may cover the incision with clean gauze & replace it after your daily shower for comfort.  TEGADERM:  You have clear gauze band-aid dressings over your closed  incision(s).  Remove the dressings 3 days after surgery 9/17 Saturday.   05/10/21 0813            Follow-up Information     [name], MD. Schedule an appointment as soon as possible for a visit in 3 week(s).   Specialties: General Surgery, Colon and Rectal Surgery Why: To follow up after your operation Contact information: [employer] [place] [address] [phone]                 Signed: Rosario Adie [id number], 7:42 AM

## 2021-05-12 NOTE — Progress Notes (Signed)
Patient is 2 Days Post-Op Robotic Colectomy and reported to be having BM's per RN, Karna Christmas. Pharmacy to discontinue Entereg (Alvimopan) per policy.

## 2021-05-15 LAB — SURGICAL PATHOLOGY

## 2021-05-15 NOTE — Progress Notes (Signed)
Patient s/p robotic proximal ileocolectomy surgery on 9/14/20222 Dx:  leal neuroendocrine carcinoid  Clint:   1.  Please set the patient up for GI Tumor Board.  Patient Care Team: Debbrah Alar, NP as PCP - General (Internal Medicine) End, Harrell Gave, MD as PCP - Cardiology (Cardiology) Deneise Lever, MD as Consulting Physician (Pulmonary Disease) [name], MD as Consulting Physician (General Surgery) Irene Shipper, MD as Consulting Physician (Gastroenterology) Remi Haggard, MD as Consulting Physician (Urology)   2.   Please set up outpatient consult(s):  med onc Dr Benay Spice or Burr Medico (if they agree on tumor board)

## 2021-05-16 ENCOUNTER — Telehealth: Payer: Self-pay | Admitting: Nurse Practitioner

## 2021-05-16 NOTE — Telephone Encounter (Signed)
Scheduled appt per 9/20 referral from Dr. Johney Maine. Pt is aware of appt date and time.

## 2021-05-18 ENCOUNTER — Telehealth: Payer: Self-pay | Admitting: *Deleted

## 2021-05-18 ENCOUNTER — Encounter: Payer: Self-pay | Admitting: *Deleted

## 2021-05-18 NOTE — Telephone Encounter (Signed)
Error- incorrect context.  Jacqlyn Larsen [facility], BSN RN Case Manager [phone]

## 2021-05-18 NOTE — Telephone Encounter (Signed)
Transition Care Management Follow-up Telephone Call Date of discharge and from where: 05/12/2021  [facility] How have you been since you were released from the hospital? "Doing very well" Any questions or concerns? No  Items Reviewed: Did the pt receive and understand the discharge instructions provided? Yes  Medications obtained and verified? Yes  Other? No  Any new allergies since your discharge? No  Dietary orders reviewed? Yes Do you have support at home? Yes   Home Care and Equipment/Supplies: Were home health services ordered? no If so, what is the name of the agency?   Has the agency set up a time to come to the patient's home? not applicable Were any new equipment or medical supplies ordered?  No What is the name of the medical supply agency?  Were you able to get the supplies/equipment? not applicable Do you have any questions related to the use of the equipment or supplies? N/a  Functional Questionnaire: (I = Independent and D = Dependent) ADLs:I  Bathing/Dressing- I  Meal Prep- I  Eating- I  Maintaining continence- I  Transferring/Ambulation- I  Managing Meds- I  Follow up appointments reviewed:  PCP Hospital f/u appt confirmed? No   Specialist Hospital f/u appt confirmed? Yes  Scheduled to see Dr. [name] (surgeon)  on 05/29/2021 Are transportation arrangements needed? No  If their condition worsens, is the pt aware to call PCP or go to the Emergency Dept.? Yes Was the patient provided with contact information for the PCP's office or ED? Yes Was to pt encouraged to call back with questions or concerns? Yes  Jacqlyn Larsen [facility], BSN RN Case Manager [phone]

## 2021-05-24 ENCOUNTER — Other Ambulatory Visit: Payer: Self-pay

## 2021-05-24 NOTE — Progress Notes (Signed)
The proposed treatment discussed in conference is for discussion purpose only and is not a binding recommendation.  The patients have not been physically examined, or presented with their treatment options.  Therefore, final treatment plans cannot be decided.  

## 2021-06-07 ENCOUNTER — Encounter: Payer: Self-pay | Admitting: Nurse Practitioner

## 2021-06-07 ENCOUNTER — Other Ambulatory Visit: Payer: Self-pay

## 2021-06-07 ENCOUNTER — Inpatient Hospital Stay: Payer: 59 | Attending: Nurse Practitioner | Admitting: Nurse Practitioner

## 2021-06-07 VITALS — BP 129/79 | HR 65 | Temp 97.1°F | Resp 18 | Wt 227.2 lb

## 2021-06-07 DIAGNOSIS — F17210 Nicotine dependence, cigarettes, uncomplicated: Secondary | ICD-10-CM | POA: Insufficient documentation

## 2021-06-07 DIAGNOSIS — I10 Essential (primary) hypertension: Secondary | ICD-10-CM | POA: Diagnosis not present

## 2021-06-07 DIAGNOSIS — D3A012 Benign carcinoid tumor of the ileum: Secondary | ICD-10-CM | POA: Diagnosis not present

## 2021-06-07 NOTE — Progress Notes (Addendum)
[place]   [phone] Fax:([phone]) Sylva Note   Patient Care Team: Debbrah Alar, NP as PCP - General (Internal Medicine) End, Harrell Gave, MD as PCP - Cardiology (Cardiology) Deneise Lever, MD as Consulting Physician (Pulmonary Disease) [name], MD as Consulting Physician (General Surgery) Irene Shipper, MD as Consulting Physician (Gastroenterology) Remi Haggard, MD as Consulting Physician (Urology) Alla Feeling, NP as Nurse Practitioner (Medical Oncology) 06/07/2021  CHIEF COMPLAINTS/PURPOSE OF CONSULTATION:  Neuroendocrine tumor of the ileum, referred by Dr. Remo Lipps gross  SUMMARY OF ONCOLOGY HISTORY:  -11/2020 initial presentation with painless hematuria  -12/02/20 CT AP w/wo contrast: kidney stones and incidental finding of nodular enhancement in the terminal ileum measuring 1.9 x 1.5 cm suspicious for small tumor and none mass enhancement just proximal to the area concerning for ileal inflammation, there is no evidence of obstruction.    -01/02/21 CT chest wo contrast: 6 mm nodule in the RIGHT middle lobe unchanged in short interval (image 86/3). Calcified RIGHT middle lobe nodule measures 4 mm on image 86.  -02/15/2021 diagnostic colonoscopy by Dr. Scarlette Shorts findings - The terminal ileum contained a submucosal partially obstructing mass. In addition, its diameter measured 20 mm. No bleeding was present. This was biopsied with a cold forceps for histology. The lesion was located about 2 or 3 cm proximal to the ileocecal valve. The endoscope was passed proximal to the lesion with normal-appearing ileal mucosa. Initial path Diagnosis Surgical [P], ileal nodule biopsies - WELL-DIFFERENTIATED NEUROENDOCRINE TUMOR, GRADE 1 Microscopic Comment By immunohistochemistry, the neoplastic cells are positive for chromogranin synaptic ficin and weakly positive for CD56. The proliferative rate by Ki-67 is low  (<1%).  -05/10/2021 PROCEDURE:  ROBOTIC PROXIMAL COLECTOMY, SURGEON:  Adin Hector, MD FINAL MICROSCOPIC DIAGNOSIS:  A. COLON, PROXIMAL RIGHT, RESECTION:  - Well-differentiated neuroendocrine tumor involving ileum, 2.2 cm in maximal extent, with extension into the underlying muscularis.  - 2 of 12 lymph nodes positive for metastatic disease.  - Tumor nodule, 1.  - Unremarkable appendix.  No tumor identified.  JEJUNUM AND ILEUM NEUROENDOCRINE TUMOR: Resection  Procedure: Ileocolic resection  Tumor Site: Ileum  Tumor Size: 2.2 cm  Tumor Focality: Unifocal  Histologic Type and Grade: Well-differentiated neuroendocrine tumor, grade 1       Mitotic Rate: Less than 2 mitoses per 2 mm  Tumor Extension: Invades muscularis propria  Lymphovascular Invasion: Not identified  Large Mesenteric Masses (>2 cm): Not identified  Margins: All margins negative for tumor  Regional Lymph Nodes: Present       Number of Lymph Nodes with Tumor: 2       Number of Lymph Nodes Examined: 12  Distant metastasis:       Distant Site(s) Involved: Not applicable  Pathologic Stage Classification (pTNM, AJCC 8th Edition): pT2, pN1    HISTORY OF PRESENTING ILLNESS:  Ryan [name]. 64 y.o. male with PMH including HTN, GERD, OSA, basal cell skin cancer, and HFpEF is here because of newly diagnosed neuroendocrine tumor of the ileum.  He initially presented to urology for painless hematuria in 11/2020.  CTAP with and without contrast on 12/02/2020 showed kidney stones and incidental finding of nodular enhancement in the terminal ileum measuring 1.9 x 1.5 cm suspicious for small tumor and none mass enhancement just proximal to the area concerning for ileal inflammation, there is no evidence of obstruction.  There is an indeterminate 6 x 5 mm right lower lobe pulmonary nodule.  This was confirmed on dedicated CT chest without contrast on 01/02/2021, the recommendation is for 6-78-month follow-up CT for stability.  Colonoscopy  on 02/15/2021 by Dr. Yancey Flemings showed a 2 cm partially obstructing mass in the terminal ileum.  Biopsy showed well differentiated neuroendocrine tumor, grade 1, with a Ki-67 of less than 1% IHC is positive for chromogranin, synaptophysin, and weakly positive for CD56.  He obtained cardiology clearance and underwent robotic proximal colectomy on 05/10/2021 by Dr. Michaell Cowing.  Surgical path showed a well differentiated neuroendocrine tumor measuring 2.2 cm with extension into the underlying muscularis, 2/12 positive lymph nodes, with a mitotic rate less than 2 mitoses per 2 mm.  All margins were negative.  This was staged pT2pN1.  Postoperative course was uneventful, bowel function returned and pain was managed by postop day 2 and he was discharged on 05/12/2021.  Socially, he is married with 1 healthy child.  Lives with his spouse.  He is retired Games developer for 40 years.  He is independent with ADLs.  Drinks beer occasionally and smokes a cigar once a month.  He has smoked cigarettes or pipe since his 79s.  Denies current chewing tobacco or vape.  Denies any other drug use.  His father died of lung cancer, mother had colon cancer to liver at age 46.  Patient has a colonoscopy every 5 years by Dr. Marina Goodell.  Today he presents with his spouse.  He has been dealing with alternating constipation and diarrhea for 6 -12 months, followed by GI.  Denies flushing, unintentional weight loss.  He is recovering from surgery, abdomen still slightly sore, not requiring pain medication.  He has cramping with certain foods, bowels move after eating.  Denies nausea/vomiting, recent fever, chills, cough, chest pain, dyspnea.  He is active, rides a Garment/textile technologist at home.  He still needs to address the kidney stone, denies recent hematuria.  MEDICAL HISTORY:  Past Medical History:  Diagnosis Date   Abnormal liver function    history of    Atypical chest pain    Blind left eye    blind left eye since birth   Cancer [facility])     skin:  basal cell carcinoma/ on face,back and neck   Carcinoid tumor of ileum s/p robotic colectomy 05/10/2021 05/10/2021   Cataract    Bil   CHF (congestive heart failure) (HCC)    Diastolic dysfunction    a.09/2016 Echo: EF 55-60%, Gr1 DD, triv MR.   Fatty liver    fatty liver disease   GERD (gastroesophageal reflux disease)    History of indigestion    History of kidney stones    Hyperlipidemia    Hypertension    Non-obstructive CAD (coronary artery disease)    a. 2016 Neg MV; b. 10/2017 Cath: LM nl, LAD 53m, D1/2/3 nl, LCX 20p, RCA nl, EF 50-55%.   Sleep apnea    SOB (shortness of breath) 2019   after climbing stairs    SURGICAL HISTORY: Past Surgical History:  Procedure Laterality Date   CARDIAC CATHETERIZATION     COLONOSCOPY     LEFT HEART CATH AND CORONARY ANGIOGRAPHY Left 10/29/2017   Procedure: LEFT HEART CATH AND CORONARY ANGIOGRAPHY;  Surgeon: Yvonne Kendall, MD;  Location: [facility] INVASIVE CV LAB;  Service: Cardiovascular;  Laterality: Left;   SKIN CANCER EXCISION     TONSILLECTOMY  1965   as a child    SOCIAL HISTORY: Social History   Socioeconomic History   Marital status: Married  Spouse name: Not on file   Number of children: 1   Years of education: Not on file   Highest education level: Not on file  Occupational History   Occupation: Designer, industrial/product: [employer]  Tobacco Use   Smoking status: Light Smoker    Types: Cigars   Smokeless tobacco: Never   Tobacco comments:    Smoked cigarettes and pipe on/off since 20's, now occasional cigar once per month  Vaping Use   Vaping Use: Never used  Substance and Sexual Activity   Alcohol use: Yes    Alcohol/week: 2.0 standard drinks    Types: 1 Cans of beer, 1 Standard drinks or equivalent per week    Comment: occasional drink beer   Drug use: No   Sexual activity: Not on file  Other Topics Concern   Not on file  Social History Narrative   Retired Engineer, building services    Social  Determinants of Radio broadcast assistant Strain: Not on file  Food Insecurity: Not on file  Transportation Needs: Not on file  Physical Activity: Not on file  Stress: Not on file  Social Connections: Not on file  Intimate Partner Violence: Not on file    FAMILY HISTORY: Family History  Problem Relation Age of Onset   Cancer Mother    Colon cancer Mother        deceased age 18   Diabetes Mother    Hypertension Mother    Cancer Father    Lung cancer Father        deceased age 58   Heart disease Father        Bypass in mid-late 50's   Heart attack Father        80   Sinusitis Sister    Diabetes Sister    Colon polyps Neg Hx    Esophageal cancer Neg Hx    Stomach cancer Neg Hx    Rectal cancer Neg Hx     ALLERGIES:  is allergic to atorvastatin and tape.  MEDICATIONS:  Current Outpatient Medications  Medication Sig Dispense Refill   aspirin EC 81 MG tablet Take 1 tablet (81 mg total) by mouth daily. 90 tablet 3   losartan (COZAAR) 25 MG tablet TAKE 1 TABLET BY MOUTH DAILY. 30 tablet 5   Omega-3 Fatty Acids (FISH OIL) 1000 MG CAPS Take 2,000 mg by mouth 2 (two) times daily.     traMADol (ULTRAM) 50 MG tablet Take 1-2 tablets (50-100 mg total) by mouth every 6 (six) hours as needed for moderate pain. 20 tablet 0   No current facility-administered medications for this visit.    REVIEW OF SYSTEMS:   Constitutional: Denies fevers, chills, flushing, or abnormal night sweats (+) 7 lbs weight loss since 01/2021 Eyes: Denies blurriness of vision, double vision or watery eyes Ears, nose, mouth, throat, and face: Denies mucositis or sore throat Respiratory: Denies cough, dyspnea or wheezes Cardiovascular: Denies palpitation, chest discomfort or lower extremity swelling Gastrointestinal:  Denies nausea, vomiting, hematochezia, heartburn (+) alternating constipation/diarrhea prior to diagnosis (+) BM after eating (+) sore abdomen (+) cramping  Skin: Denies abnormal skin rashes   Lymphatics: Denies new lymphadenopathy or easy bruising Neurological:Denies numbness, tingling or new weaknesses Behavioral/Psych: Mood is stable, no new changes  All other systems were reviewed with the patient and are negative.  PHYSICAL EXAMINATION: ECOG PERFORMANCE STATUS: 1 - Symptomatic but completely ambulatory  Vitals:   06/07/21 1128  BP: 129/79  Pulse:  65  Resp: 18  Temp: (!) 97.1 F (36.2 C)  SpO2: 99%   Filed Weights   06/07/21 1128  Weight: 227 lb 4 oz (103.1 kg)    GENERAL:alert, no distress and comfortable SKIN: no rash  EYES: sclera clear NECK: Without mass LYMPH:  no palpable cervical or supraclavicular lymphadenopathy LUNGS: clear with normal breathing effort HEART: regular rate & rhythm, no lower extremity edema ABDOMEN:abdomen soft, non-tender and normal bowel sounds.  Transverse lower abdominal and 4 laparoscopic abdominal incisions are well-healed Musculoskeletal:no cyanosis of digits and no clubbing  PSYCH: alert & oriented x 3 with fluent speech NEURO: no focal motor/sensory deficits  LABORATORY DATA:  I have reviewed the data as listed CBC Latest Ref Rng & Units 05/11/2021 04/25/2021 09/30/2020  WBC 4.0 - 10.5 K/uL 15.7(H) 6.2 5.1  Hemoglobin 13.0 - 17.0 g/dL 14.2 15.3 14.4  Hematocrit 39.0 - 52.0 % 40.5 45.0 41.5  Platelets 150 - 400 K/uL 182 199 186.0   CMP Latest Ref Rng & Units 05/11/2021 04/25/2021 12/22/2020  Glucose 70 - 99 mg/dL 154(H) 103(H) 95  BUN 8 - 23 mg/dL $Remove'13 23 17  'tUfypKL$ Creatinine 0.61 - 1.24 mg/dL 1.04 0.93 1.16  Sodium 135 - 145 mmol/L 136 139 137  Potassium 3.5 - 5.1 mmol/L 4.6 4.9 4.3  Chloride 98 - 111 mmol/L 105 108 103  CO2 22 - 32 mmol/L $RemoveB'25 25 28  'DGIInaIz$ Calcium 8.9 - 10.3 mg/dL 9.1 9.7 10.1  Total Protein 6.0 - 8.3 g/dL - - -  Total Bilirubin 0.2 - 1.2 mg/dL - - -  Alkaline Phos 39 - 117 U/L - - -  AST 0 - 37 U/L - - -  ALT 0 - 53 U/L - - -     RADIOGRAPHIC STUDIES: I have personally reviewed the radiological images as listed  and agreed with the findings in the report. No results found.  ASSESSMENT & PLAN: 65 year old male  1 Neuroendocrine tumor of the terminal ileum, grade 1, pT2N1, stage III, Ki-67 < 1% -We reviewed his outside work up in detail. He had incidental finding of 2 cm ileum/small bowel mass on work up for hematuria. Diagnostic colonoscopy path showed low grade neuroendocrine tumor.  CT AP 12/02/2020 and CT chest 01/02/2021 showed a small indeterminate right lung nodule, otherwise negative for metastatic disease -He underwent partial colectomy by Dr. Johney Maine on 05/10/2021, path showed well differentiated 2.2 cm NET in the terminal ileum invading the muscularis propria, clear margins, 2/12 positive lymph nodes, and < 2 mitoses per 2 millimeter squared.  This was staged pT2pN1 -Given his initial scans were 6 months ago, we are referring him for dotatate PET scan to rule out residual disease or possible second primary tumor.  We will check baseline chromogranin A and 24-hour urine 5-HIAA.  I will call him with results. -We reviewed surgical path and that the tumor was completely resected, we are not recommending adjuvant chemotherapy. -Mr. Bertz appears stable.  He is recovering well from surgery, pain is managed.  Appetite and energy are adequate.  Bowels move after meals.  He did not have flushing or significant diarrhea at baseline.   -We discussed the recurrence risk in stage III disease, we recommend surveillance including lab and physical exam every 4-6 months for at least the first 3 years, then annually, with CT every 6-12 months for first 2-3 years for total of 5-year surveillance plan -We reviewed signs and symptoms of recurrent/metastatic disease including unintentional weight loss, fatigue, abdominal pain or  bloating, diarrhea, flushing, bone pain.  He knows to call us with concerns  -return for lab and follow-up in 4 months -Patient seen with Dr. Burr Medico  2 Hematuria and kidney stones -f/up with urology  Dr. Milford Cage at [facility] specialists -Denies recent hematuria  3 HTN, GERD, OSA -Per PCP -He knows to avoid certain foods and meds including PPI, aspirin 2 days prior to tumor marker testing  Plan: -Work-up and surgical path reviewed -No adjuvant chemo recommended, begin surveillance -Lab and staging dotatate PET scan in 1-2 weeks, will call him with results -Surveillance visit in 6 months, or sooner if needed  Orders Placed This Encounter  Procedures   NM PET (NETSPOT GA 58 DOTATATE) SKULL BASE TO MID THIGH    NET small bowel s/p surgery 05/10/21 stage III. R/o residual disease or multiple primary    Standing Status:   Future    Standing Expiration Date:   06/07/2022    Order Specific Question:   If indicated for the ordered procedure, I authorize the administration of a radiopharmaceutical per Radiology protocol    Answer:   Yes    Order Specific Question:   Preferred imaging location?    Answer:   [place]   CBC with Differential (Cancer Center Only)    Standing Status:   Future    Standing Expiration Date:   06/07/2022   CMP ([place] only)    Standing Status:   Future    Standing Expiration Date:   06/07/2022   Chromogranin A    Standing Status:   Future    Standing Expiration Date:   06/07/2022   24 Hr Ur 5 HIAA Qnt    Standing Status:   Future    Standing Expiration Date:   06/07/2022     All questions were answered. The patient knows to call the clinic with any problems, questions or concerns.     Alla Feeling, NP 06/07/2021    Addendum  I have reviewed the above documentation for accuracy and completeness, and I agree with the above.  65 yo male with PMH of hypertension, GERD, OSA, and kidney stone, presented with incidental finding of a terminal ileum mass.  Status post surgical resection.  I reviewed his initial biopsy and the surgical pathology findings and discussed with patient in detail.  He had a stage III well-differentiated  neuroendocrine tumor of small bowel, status post complete resection, no adjuvant therapy is recommended.  We discussed that his risk of recurrence, and I recommend surveillance.  His initial CT scan 6 months ago were negative for distant metastasis.  We will obtain dotatate PET scan as a new staging scan giving its high sensitivity, will also check blood and urine tumor markers.  Follow-up plan discussed with him in detail, he agrees with the plan.  I will see him back in 4 months.  Truitt Merle  06/07/2021

## 2021-06-08 ENCOUNTER — Telehealth: Payer: Self-pay | Admitting: Nurse Practitioner

## 2021-06-08 NOTE — Telephone Encounter (Signed)
Scheduled follow-up appointment per 10/12 los. Patient is aware. 

## 2021-06-12 NOTE — Progress Notes (Signed)
I spoke with Mr Ryan [name] regarding lab appt prior to PET scan.  Lab appt 10/27 at 0830 arrive at 0815. Phone visit set up with Ryan [name] for 10/31 at 0930.  All questions answered.  Mr Ryan [name] verbalized understanding.

## 2021-06-15 ENCOUNTER — Other Ambulatory Visit: Payer: Self-pay | Admitting: Urology

## 2021-06-15 DIAGNOSIS — N200 Calculus of kidney: Secondary | ICD-10-CM

## 2021-06-22 ENCOUNTER — Other Ambulatory Visit: Payer: Self-pay

## 2021-06-22 ENCOUNTER — Inpatient Hospital Stay: Payer: 59

## 2021-06-22 ENCOUNTER — Ambulatory Visit (HOSPITAL_COMMUNITY)
Admission: RE | Admit: 2021-06-22 | Discharge: 2021-06-22 | Disposition: A | Discharge status: Home / Self Care | Payer: 59 | Source: Ambulatory Visit | Attending: Nurse Practitioner | Admitting: Nurse Practitioner

## 2021-06-22 ENCOUNTER — Encounter (HOSPITAL_BASED_OUTPATIENT_CLINIC_OR_DEPARTMENT_OTHER): Payer: Self-pay | Admitting: Urology

## 2021-06-22 DIAGNOSIS — D3A012 Benign carcinoid tumor of the ileum: Secondary | ICD-10-CM

## 2021-06-22 LAB — CMP (CANCER CENTER ONLY)
ALT: 28 U/L (ref 0–44)
AST: 19 U/L (ref 15–41)
Albumin: 4.1 g/dL (ref 3.5–5.0)
Alkaline Phosphatase: 76 U/L (ref 38–126)
Anion gap: 8 (ref 5–15)
BUN: 19 mg/dL (ref 8–23)
CO2: 26 mmol/L (ref 22–32)
Calcium: 9.6 mg/dL (ref 8.9–10.3)
Chloride: 106 mmol/L (ref 98–111)
Creatinine: 1.06 mg/dL (ref 0.61–1.24)
GFR, Estimated: >60 mL/min (ref >60)
Glucose, Bld: 81 mg/dL (ref 70–99)
Potassium: 4.6 mmol/L (ref 3.5–5.1)
Sodium: 140 mmol/L (ref 135–145)
Total Bilirubin: 0.7 mg/dL (ref 0.3–1.2)
Total Protein: 7.5 g/dL (ref 6.5–8.1)

## 2021-06-22 LAB — CBC WITH DIFFERENTIAL (CANCER CENTER ONLY)
Abs Immature Granulocytes: 0.04 10*3/uL (ref 0.00–0.07)
Basophils Absolute: 0 10*3/uL (ref 0.0–0.1)
Basophils Relative: 1 %
Eosinophils Absolute: 0.2 10*3/uL (ref 0.0–0.5)
Eosinophils Relative: 4 %
HCT: 41.5 % (ref 39.0–52.0)
Hemoglobin: 14.2 g/dL (ref 13.0–17.0)
Immature Granulocytes: 1 %
Lymphocytes Relative: 25 %
Lymphs Abs: 1.2 10*3/uL (ref 0.7–4.0)
MCH: 30.2 pg (ref 26.0–34.0)
MCHC: 34.2 g/dL (ref 30.0–36.0)
MCV: 88.3 fL (ref 80.0–100.0)
Monocytes Absolute: 0.4 10*3/uL (ref 0.1–1.0)
Monocytes Relative: 9 %
Neutro Abs: 2.9 10*3/uL (ref 1.7–7.7)
Neutrophils Relative %: 60 %
Platelet Count: 196 10*3/uL (ref 150–400)
RBC: 4.7 MIL/uL (ref 4.22–5.81)
RDW: 11.6 % (ref 11.5–15.5)
WBC Count: 4.7 10*3/uL (ref 4.0–10.5)
nRBC: 0 % (ref 0.0–0.2)

## 2021-06-22 MED ORDER — GALLIUM GA 68 GOZETOTIDE (LOCAMETZ): 5.4300 | PACK | Freq: Once | INTRAVENOUS | Status: DC

## 2021-06-22 MED ORDER — GALLIUM GA 68 DOTATATE IV KIT
5.4300 | PACK | Freq: Once | INTRAVENOUS | Status: AC | PRN
  Administered 2021-06-22: 5.43 via INTRAVENOUS

## 2021-06-22 NOTE — Progress Notes (Signed)
Pre-op call completed.  Ride secured it will be his wife.  Discussed medications to stop.  Arrival 8 a.m.  Patient verbalized understanding.

## 2021-06-23 LAB — CHROMOGRANIN A: Chromogranin A (ng/mL): 55.9 ng/mL (ref 0.0–101.8)

## 2021-06-25 LAB — 5 HIAA, QUANTITATIVE, URINE, 24 HOUR
5-HIAA, Ur: 3.9 mg/L
5-HIAA,Quant.,24 Hr Urine: 5.5 mg/24 hr (ref 0.0–14.9)
Total Volume: 1400

## 2021-06-26 ENCOUNTER — Ambulatory Visit (HOSPITAL_BASED_OUTPATIENT_CLINIC_OR_DEPARTMENT_OTHER)
Admission: RE | Admit: 2021-06-26 | Discharge: 2021-06-26 | Disposition: A | Discharge status: Home / Self Care | Payer: 59 | Source: Ambulatory Visit | Attending: Urology | Admitting: Urology

## 2021-06-26 ENCOUNTER — Encounter (HOSPITAL_BASED_OUTPATIENT_CLINIC_OR_DEPARTMENT_OTHER): Payer: Self-pay | Admitting: Urology

## 2021-06-26 ENCOUNTER — Other Ambulatory Visit: Payer: Self-pay

## 2021-06-26 ENCOUNTER — Encounter (HOSPITAL_BASED_OUTPATIENT_CLINIC_OR_DEPARTMENT_OTHER)
Admission: RE | Disposition: A | Discharge status: Home / Self Care | Payer: Self-pay | Source: Ambulatory Visit | Attending: Urology

## 2021-06-26 ENCOUNTER — Ambulatory Visit: Payer: 59 | Admitting: Nurse Practitioner

## 2021-06-26 ENCOUNTER — Ambulatory Visit (HOSPITAL_COMMUNITY): Payer: 59

## 2021-06-26 DIAGNOSIS — N200 Calculus of kidney: Secondary | ICD-10-CM | POA: Insufficient documentation

## 2021-06-26 DIAGNOSIS — F17290 Nicotine dependence, other tobacco product, uncomplicated: Secondary | ICD-10-CM | POA: Insufficient documentation

## 2021-06-26 HISTORY — PX: EXTRACORPOREAL SHOCK WAVE LITHOTRIPSY: SHX1557

## 2021-06-26 SURGERY — LITHOTRIPSY, ESWL
Anesthesia: LOCAL | Laterality: Right

## 2021-06-26 MED ORDER — DIPHENHYDRAMINE HCL 25 MG PO CAPS
25.0000 mg | ORAL_CAPSULE | ORAL | Status: AC
  Administered 2021-06-26: 25 mg via ORAL

## 2021-06-26 MED ORDER — CIPROFLOXACIN HCL 500 MG PO TABS
ORAL_TABLET | ORAL | Status: AC
  Filled 2021-06-26: qty 1

## 2021-06-26 MED ORDER — DIPHENHYDRAMINE HCL 25 MG PO CAPS
ORAL_CAPSULE | ORAL | Status: AC
  Filled 2021-06-26: qty 1

## 2021-06-26 MED ORDER — DIAZEPAM 5 MG PO TABS
ORAL_TABLET | ORAL | Status: AC
  Filled 2021-06-26: qty 2

## 2021-06-26 MED ORDER — TAMSULOSIN HCL 0.4 MG PO CAPS: 0.4000 mg | ORAL_CAPSULE | Freq: Every day | ORAL | 1 refills | Status: DC

## 2021-06-26 MED ORDER — CIPROFLOXACIN HCL 500 MG PO TABS
500.0000 mg | ORAL_TABLET | ORAL | Status: AC
  Administered 2021-06-26: 500 mg via ORAL

## 2021-06-26 MED ORDER — DIAZEPAM 5 MG PO TABS
10.0000 mg | ORAL_TABLET | ORAL | Status: AC
  Administered 2021-06-26: 10 mg via ORAL

## 2021-06-26 MED ORDER — HYDROCODONE-ACETAMINOPHEN 5-325 MG PO TABS: 1.0000 | ORAL_TABLET | ORAL | 0 refills | Status: DC | PRN

## 2021-06-26 MED ORDER — SODIUM CHLORIDE 0.9 % IV SOLN: INTRAVENOUS | Status: DC

## 2021-06-26 NOTE — Progress Notes (Signed)
[place]   Telephone:([phone]) [phone] Fax:(336) [phone]   Clinic Follow up Note   Patient Care Team: Debbrah Alar, NP as PCP - General (Internal Medicine) End, Harrell Gave, MD as PCP - Cardiology (Cardiology) Deneise Lever, MD as Consulting Physician (Pulmonary Disease) [name], MD as Consulting Physician (General Surgery) Irene Shipper, MD as Consulting Physician (Gastroenterology) Remi Haggard, MD as Consulting Physician (Urology) Alla Feeling, NP as Nurse Practitioner (Medical Oncology) 06/26/2021  I connected with Ryan [name]. on 06/27/21 at 10:30 AM EDT by telephone visit and verified that I am speaking with the correct person using two identifiers.   I discussed the limitations, risks, security and privacy concerns of performing an evaluation and management service by telemedicine and the availability of in-person appointments. I also discussed with the patient that there may be a patient responsible charge related to this service. The patient expressed understanding and agreed to proceed.   Other persons participating in the visit and their role in the encounter: None   Patient's location: Home  Provider's location: [place]: Follow up stage III NET of ileum  SUMMARY OF ONCOLOGY HISTORY:  -11/2020 initial presentation with painless hematuria   -12/02/20 CT AP w/wo contrast: kidney stones and incidental finding of nodular enhancement in the terminal ileum measuring 1.9 x 1.5 cm suspicious for small tumor and none mass enhancement just proximal to the area concerning for ileal inflammation, there is no evidence of obstruction.     -01/02/21 CT chest wo contrast: 6 mm nodule in the RIGHT middle lobe unchanged in short interval (image 86/3). Calcified RIGHT middle lobe nodule measures 4 mm on image 86.   -02/15/2021 diagnostic colonoscopy by Dr. Scarlette Shorts findings - The terminal ileum contained a submucosal partially  obstructing mass. In addition, its diameter measured 20 mm. No bleeding was present. This was biopsied with a cold forceps for histology. The lesion was located about 2 or 3 cm proximal to the ileocecal valve. The endoscope was passed proximal to the lesion with normal-appearing ileal mucosa. Initial path Diagnosis Surgical [P], ileal nodule biopsies - WELL-DIFFERENTIATED NEUROENDOCRINE TUMOR, GRADE 1 Microscopic Comment By immunohistochemistry, the neoplastic cells are positive for chromogranin synaptic ficin and weakly positive for CD56. The proliferative rate by Ki-67 is low (<1%).   -05/10/2021 PROCEDURE:  ROBOTIC PROXIMAL COLECTOMY, SURGEON:  Adin Hector, MD FINAL MICROSCOPIC DIAGNOSIS:  A. COLON, PROXIMAL RIGHT, RESECTION:  - Well-differentiated neuroendocrine tumor involving ileum, 2.2 cm in maximal extent, with extension into the underlying muscularis.  - 2 of 12 lymph nodes positive for metastatic disease.  - Tumor nodule, 1.  - Unremarkable appendix.  No tumor identified.  JEJUNUM AND ILEUM NEUROENDOCRINE TUMOR: Resection  Procedure: Ileocolic resection  Tumor Site: Ileum  Tumor Size: 2.2 cm  Tumor Focality: Unifocal  Histologic Type and Grade: Well-differentiated neuroendocrine tumor, grade 1       Mitotic Rate: Less than 2 mitoses per 2 mm  Tumor Extension: Invades muscularis propria  Lymphovascular Invasion: Not identified  Large Mesenteric Masses (>2 cm): Not identified  Margins: All margins negative for tumor  Regional Lymph Nodes: Present       Number of Lymph Nodes with Tumor: 2       Number of Lymph Nodes Examined: 12  Distant metastasis:       Distant Site(s) Involved: Not applicable  Pathologic Stage Classification (pTNM, AJCC 8th Edition): pT2, pN1   -06/22/2021 postop chromogranin A, normal  55.9 and 5-HIAA urine normal, 3.9 -06/22/2021 staging PET dotatate: IMPRESSION: 1. No evidence of neuroendocrine tumor metastasis in the small bowel mesentery or liver.  No distant disease or adenopathy. 2. Scattered activity within the pancreas with some focality in the tail is favored physiologic. Recommend attention on routine follow-up. 3. Stable small pulmonary nodule in the RIGHT lower lobe.  CURRENT THERAPY: Surveillance  INTERVAL HISTORY: Ryan [name] returns virtually as scheduled to review recent work up.  He is doing well overall, still dealing with a kidney stone he has not passed.  Denies pain or hematuria.  He continues to recover well from recent colon surgery, no follow-up scheduled with Dr. Johney Maine.  Appetite is normal, weight is stable.  Denies abdominal pain.  Bowel movements depend on what he eats, he may have diarrhea if he eats more or greasy foods.  BMs are overall better than they were.  Not taking antidiarrheals.  Denies GI bleeding.  He has occasional flushing sensation.  Denies fever, chills, cough, chest pain, dyspnea, or other new concerns.   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   Fatty liver    fatty liver disease   GERD (gastroesophageal reflux disease)    History of indigestion    History of kidney stones    Hyperlipidemia    Hypertension    Non-obstructive CAD (coronary artery disease)    a. 2016 Neg MV; b. 10/2017 Cath: LM nl, LAD 31m D1/2/3 nl, LCX 20p, RCA nl, EF 50-55%.   Sleep apnea    SOB (shortness of breath) 2019   after climbing stairs    SURGICAL HISTORY: Past Surgical History:  Procedure Laterality Date   CARDIAC CATHETERIZATION     COLONOSCOPY     LEFT HEART CATH AND CORONARY ANGIOGRAPHY Left 10/29/2017    Procedure: LEFT HEART CATH AND CORONARY ANGIOGRAPHY;  Surgeon: ENelva Bush MD;  [facility];  Service: Cardiovascular;  [place]: [place]    I have reviewed the social history and family history with the patient and they are unchanged from previous note.  ALLERGIES:  is allergic to atorvastatin and tape.  MEDICATIONS:  Current Outpatient Medications  Medication Sig Dispense Refill   aspirin EC 81 MG tablet Take 1 tablet (81 mg total) by mouth daily. 90 tablet 3   HYDROcodone-acetaminophen (NORCO) 5-325 MG tablet Take 1 tablet by mouth every 4 (four) hours as needed for moderate pain. 10 tablet 0   losartan (COZAAR) 25 MG tablet TAKE 1 TABLET BY MOUTH DAILY. 30 tablet 5   Omega-3 Fatty Acids (FISH OIL) 1000 MG CAPS Take 2,000 mg by mouth 2 (two) times daily.     tamsulosin (FLOMAX) 0.4 MG CAPS capsule Take 1 capsule (0.4 mg total) by mouth daily. 15 capsule 1   traMADol (ULTRAM) 50 MG tablet Take 1-2 tablets (50-100 mg total) by mouth every 6 (six) hours  as needed for moderate pain. 20 tablet 0   No current facility-administered medications for this visit.    PHYSICAL EXAMINATION: ECOG PERFORMANCE STATUS: 0 - Asymptomatic  There were no vitals filed for this visit. There were no vitals filed for this visit.  Patient appears well over the phone.  Voice is strong, speech is clear.  No cough or conversational dyspnea.  LABORATORY DATA:  I have reviewed the data as listed CBC Latest Ref Rng & Units 06/22/2021 05/11/2021 04/25/2021  WBC 4.0 - 10.5 K/uL 4.7 15.7(H) 6.2  Hemoglobin 13.0 - 17.0 g/dL 14.2 14.2 15.3  Hematocrit 39.0 - 52.0 % 41.5 40.5 45.0  Platelets 150 - 400 K/uL 196 182 199     CMP Latest Ref Rng & Units 06/22/2021 05/11/2021 04/25/2021  Glucose 70 - 99 mg/dL 81 154(H) 103(H)  BUN 8 - 23 mg/dL _0 Creatinine 0.61 - 1.24 mg/dL 1.06 1.04 0.93  Sodium 135 - 145 mmol/L 140 136 139   Potassium 3.5 - 5.1 mmol/L 4.6 4.6 4.9  Chloride 98 - 111 mmol/L 106 105 108  CO2 22 - 32 mmol/L _1 Calcium 8.9 - 10.3 mg/dL 9.6 9.1 9.7  Total Protein 6.5 - 8.1 g/dL 7.5 - -  Total Bilirubin 0.3 - 1.2 mg/dL 0.7 - -  Alkaline Phos 38 - 126 U/L 76 - -  AST 15 - 41 U/L 19 - -  ALT 0 - 44 U/L 28 - -      RADIOGRAPHIC STUDIES: I have personally reviewed the radiological images as listed and agreed with the findings in the report. DG Abd 1 View  Result Date: 06/26/2021 CLINICAL DATA:  Renal stone EXAM: ABDOMEN - 1 VIEW COMPARISON:  Study done in outside institution on 06/09/2021 FINDINGS: Bowel gas pattern is nonspecific. There is 15 mm calcification overlying the lower pole of right kidney. Kidneys are partly obscured by bowel contents. Degenerative changes are noted in lumbar spine. IMPRESSION: There is a 15 mm right renal calculus. Electronically Signed   By: Elmer Picker M.D.   On: 06/26/2021 15:17     ASSESSMENT & PLAN: 65 year old male   1 Neuroendocrine tumor of the terminal ileum, grade 1, pT2N1, stage III, Ki-67 < 1% -He had incidental finding of 2 cm ileum/small bowel mass on work up for hematuria. Diagnostic colonoscopy path showed low grade neuroendocrine tumor.  CT AP 12/02/2020 and CT chest 01/02/2021 showed a small indeterminate right lung nodule, otherwise negative for metastatic disease -S/p partial colectomy by Dr. Johney Maine on 05/10/2021, path showed well differentiated 2.2 cm NET in the terminal ileum invading the muscularis propria, clear margins, 2/12 positive lymph nodes, and < 2 mitoses per 2 millimeter squared.  This was staged pT2pN1 -Baseline chromogranin A and urine 5-HIAA 06/22/2021 are normal -Dotatate PET scan 06/22/2021 shows no residual, recurrent, or metastatic disease or concern for second primary primary -Well on surveillance, recovering well.  Still has mild residual flushing and intermittent diarrhea mostly diet related -We discussed the recurrence  risk in stage III disease, we recommend surveillance including lab and physical exam every 4-6 months for at least the first 3 years, then annually, with CT every 6-12 months for first 2-3 years for total of 5-year surveillance plan -We reviewed signs and symptoms of recurrent/metastatic disease including unintentional weight loss, fatigue, abdominal pain or bloating, diarrhea, flushing, bone pain.  He knows to call us with concerns  -Follow-up in 3 months   2 Hematuria and kidney  stones -f/up with urology Dr. Milford Cage at [facility] -Denies recent hematuria -S/p lithotripsy by Dr. Link Snuffer 06/26/2021   3 HTN, GERD, OSA -Per PCP -He knows to avoid certain foods and meds including PPI, aspirin 2 days prior to tumor marker testing    Disposition: Ryan [name] is clinically doing well.  Bowels still adjusting, he has mild occasional flushing sensation.  I reviewed his recent CBC, CMP, chromogranin A, and urine 5-HIAA which are all normal.  I personally reviewed his PET scan and discussed findings with Dr. Burr Medico who also spoke to Dr. Leonia Reeves.  There is no clinical evidence of residual, recurrent, or metastatic disease.  Continue surveillance.  I encouraged him to continue recovery, engage in healthy lifestyle, hydrate, eat a well-balanced healthy diet, and continue appropriate follow-up with his medical team.  We reviewed red flags/signs and symptoms of recurrence.  We will see him back for surveillance follow-up 10/09/2020 as scheduled, or sooner if needed.  I discussed the assessment and treatment plan with the patient. The patient was provided an opportunity to ask questions and all were answered. The patient agreed with the plan and demonstrated an understanding of the instructions.   The patient was advised to call back or seek an in-person evaluation if the symptoms worsen or if the condition fails to improve as anticipated. Total non-face-to-face time in today's encounter was  15 minutes, including review of test results, coordination of care, and counseling.     Alla Feeling, NP 06/26/21

## 2021-06-26 NOTE — H&P (Signed)
See scanned H&P

## 2021-06-26 NOTE — Op Note (Signed)
See [facility] OP note scanned into chart. Also because of the size, density, location and other factors that cannot be anticipated I feel this will likely be a staged procedure. This fact supersedes any indication in the scanned [facility] operative note to the contrary.  

## 2021-06-27 ENCOUNTER — Inpatient Hospital Stay: Payer: 59 | Attending: Nurse Practitioner | Admitting: Nurse Practitioner

## 2021-06-27 ENCOUNTER — Encounter (HOSPITAL_BASED_OUTPATIENT_CLINIC_OR_DEPARTMENT_OTHER): Payer: Self-pay | Admitting: Urology

## 2021-06-27 DIAGNOSIS — D3A012 Benign carcinoid tumor of the ileum: Secondary | ICD-10-CM | POA: Diagnosis not present

## 2021-06-28 ENCOUNTER — Emergency Department (HOSPITAL_COMMUNITY)
Admission: EM | Admit: 2021-06-28 | Discharge: 2021-06-29 | Disposition: A | Discharge status: Home / Self Care | Payer: 59 | Attending: Student | Admitting: Student

## 2021-06-28 ENCOUNTER — Encounter (HOSPITAL_COMMUNITY): Payer: Self-pay

## 2021-06-28 ENCOUNTER — Other Ambulatory Visit: Payer: Self-pay

## 2021-06-28 ENCOUNTER — Emergency Department (HOSPITAL_COMMUNITY): Payer: 59

## 2021-06-28 DIAGNOSIS — R112 Nausea with vomiting, unspecified: Secondary | ICD-10-CM | POA: Insufficient documentation

## 2021-06-28 DIAGNOSIS — R109 Unspecified abdominal pain: Secondary | ICD-10-CM | POA: Diagnosis present

## 2021-06-28 DIAGNOSIS — Z5321 Procedure and treatment not carried out due to patient leaving prior to being seen by health care provider: Secondary | ICD-10-CM | POA: Insufficient documentation

## 2021-06-28 DIAGNOSIS — R319 Hematuria, unspecified: Secondary | ICD-10-CM | POA: Insufficient documentation

## 2021-06-28 LAB — CBC WITH DIFFERENTIAL/PLATELET
Abs Immature Granulocytes: 0.04 10*3/uL (ref 0.00–0.07)
Basophils Absolute: 0 10*3/uL (ref 0.0–0.1)
Basophils Relative: 0 %
Eosinophils Absolute: 0 10*3/uL (ref 0.0–0.5)
Eosinophils Relative: 0 %
HCT: 42 % (ref 39.0–52.0)
Hemoglobin: 14.8 g/dL (ref 13.0–17.0)
Immature Granulocytes: 0 %
Lymphocytes Relative: 4 %
Lymphs Abs: 0.5 10*3/uL — ABNORMAL LOW (ref 0.7–4.0)
MCH: 30.6 pg (ref 26.0–34.0)
MCHC: 35.2 g/dL (ref 30.0–36.0)
MCV: 87 fL (ref 80.0–100.0)
Monocytes Absolute: 0.6 10*3/uL (ref 0.1–1.0)
Monocytes Relative: 5 %
Neutro Abs: 11.3 10*3/uL — ABNORMAL HIGH (ref 1.7–7.7)
Neutrophils Relative %: 91 %
Platelets: 224 10*3/uL (ref 150–400)
RBC: 4.83 MIL/uL (ref 4.22–5.81)
RDW: 11.6 % (ref 11.5–15.5)
WBC: 12.5 10*3/uL — ABNORMAL HIGH (ref 4.0–10.5)
nRBC: 0 % (ref 0.0–0.2)

## 2021-06-28 LAB — URINALYSIS, ROUTINE W REFLEX MICROSCOPIC
Bacteria, UA: NONE SEEN
Bilirubin Urine: NEGATIVE
Glucose, UA: NEGATIVE mg/dL
Ketones, ur: NEGATIVE mg/dL
Leukocytes,Ua: NEGATIVE
Nitrite: NEGATIVE
Protein, ur: NEGATIVE mg/dL
RBC / HPF: >50 RBC/hpf — ABNORMAL HIGH (ref 0–5)
Specific Gravity, Urine: 1.016 (ref 1.005–1.030)
pH: 9 — ABNORMAL HIGH (ref 5.0–8.0)

## 2021-06-28 LAB — COMPREHENSIVE METABOLIC PANEL
ALT: 34 U/L (ref 0–44)
AST: 33 U/L (ref 15–41)
Albumin: 4.4 g/dL (ref 3.5–5.0)
Alkaline Phosphatase: 76 U/L (ref 38–126)
Anion gap: 7 (ref 5–15)
BUN: 20 mg/dL (ref 8–23)
CO2: 26 mmol/L (ref 22–32)
Calcium: 9.8 mg/dL (ref 8.9–10.3)
Chloride: 103 mmol/L (ref 98–111)
Creatinine, Ser: 1.36 mg/dL — ABNORMAL HIGH (ref 0.61–1.24)
GFR, Estimated: 58 mL/min — ABNORMAL LOW (ref >60)
Glucose, Bld: 129 mg/dL — ABNORMAL HIGH (ref 70–99)
Potassium: 4.1 mmol/L (ref 3.5–5.1)
Sodium: 136 mmol/L (ref 135–145)
Total Bilirubin: 0.7 mg/dL (ref 0.3–1.2)
Total Protein: 7.9 g/dL (ref 6.5–8.1)

## 2021-06-28 LAB — LIPASE, BLOOD: Lipase: 37 U/L (ref 11–51)

## 2021-06-28 NOTE — ED Notes (Signed)
Patient transported to CT 

## 2021-06-28 NOTE — ED Triage Notes (Signed)
Pt BIB EMS from home. Pt has kidney stones that are too big to pass, pt had a lithotripsy 2 days ago. Pt has had hematuria since. Pt c/o pain 8/10 to right flank.   187/86 78 HR 14 Resp 98% RA 110 CBG  50 mcg fentanyl given IM by EMS

## 2021-06-28 NOTE — ED Provider Notes (Signed)
Emergency Medicine Provider Triage Evaluation Note  Galen Russman. , a 65 y.o. male  was evaluated in triage.  Pt complains of severe right flank pain associated with hematuria.  Patient had a lithotripsy performed on 10/31 by Dr. Gloriann Loan.  No fever or chills.  He endorses nausea and vomiting. No dysuria.  Review of Systems  Positive: Flank pain, hematuria Negative: fever  Physical Exam  BP (!) 175/91 (BP Location: Left Arm)   Pulse 76   Temp 98.3 F (36.8 C) (Oral)   Resp 18   Ht 5\' 9"  (1.753 m)   Wt 99.8 kg   SpO2 98%   BMI 32.49 kg/m  Gen:   Awake, no distress   Resp:  Normal effort  MSK:   Moves extremities without difficulty  Other:    Medical Decision Making  Medically screening exam initiated at 10:36 PM.  Appropriate orders placed.  [name] was informed that the remainder of the evaluation will be completed by another provider, this initial triage assessment does not replace that evaluation, and the importance of remaining in the ED until their evaluation is complete.  Labs Repeat CT renal study   Karie Kirks 06/28/21 2239    Fredia Sorrow, MD 07/14/21 0730

## 2021-06-29 NOTE — ED Notes (Signed)
Pt stated "I am just going leave to get an appointment with my primary care" and left the building.

## 2021-07-03 ENCOUNTER — Emergency Department (HOSPITAL_COMMUNITY)
Admission: EM | Admit: 2021-07-03 | Discharge: 2021-07-04 | Disposition: A | Discharge status: Home / Self Care | Payer: 59 | Source: Home / Self Care

## 2021-07-03 ENCOUNTER — Encounter (HOSPITAL_COMMUNITY): Payer: Self-pay | Admitting: Emergency Medicine

## 2021-07-03 ENCOUNTER — Other Ambulatory Visit: Payer: Self-pay

## 2021-07-03 DIAGNOSIS — I509 Heart failure, unspecified: Secondary | ICD-10-CM | POA: Diagnosis not present

## 2021-07-03 DIAGNOSIS — Z859 Personal history of malignant neoplasm, unspecified: Secondary | ICD-10-CM | POA: Insufficient documentation

## 2021-07-03 DIAGNOSIS — I2510 Atherosclerotic heart disease of native coronary artery without angina pectoris: Secondary | ICD-10-CM | POA: Diagnosis not present

## 2021-07-03 DIAGNOSIS — F17200 Nicotine dependence, unspecified, uncomplicated: Secondary | ICD-10-CM | POA: Diagnosis not present

## 2021-07-03 DIAGNOSIS — R112 Nausea with vomiting, unspecified: Secondary | ICD-10-CM | POA: Insufficient documentation

## 2021-07-03 DIAGNOSIS — I110 Hypertensive heart disease with heart failure: Secondary | ICD-10-CM | POA: Diagnosis not present

## 2021-07-03 DIAGNOSIS — Z5321 Procedure and treatment not carried out due to patient leaving prior to being seen by health care provider: Secondary | ICD-10-CM | POA: Insufficient documentation

## 2021-07-03 DIAGNOSIS — I340 Nonrheumatic mitral (valve) insufficiency: Secondary | ICD-10-CM | POA: Diagnosis not present

## 2021-07-03 DIAGNOSIS — N201 Calculus of ureter: Secondary | ICD-10-CM | POA: Diagnosis present

## 2021-07-03 DIAGNOSIS — K5900 Constipation, unspecified: Secondary | ICD-10-CM | POA: Insufficient documentation

## 2021-07-03 DIAGNOSIS — N132 Hydronephrosis with renal and ureteral calculous obstruction: Secondary | ICD-10-CM | POA: Diagnosis not present

## 2021-07-03 DIAGNOSIS — N200 Calculus of kidney: Secondary | ICD-10-CM | POA: Insufficient documentation

## 2021-07-03 DIAGNOSIS — R109 Unspecified abdominal pain: Secondary | ICD-10-CM | POA: Insufficient documentation

## 2021-07-03 NOTE — ED Triage Notes (Signed)
Pt arrives POV w/ c/o flank pain due to kidney stones. Pt c/o constipation due to pain medications as well. N/V. Hx cancer.

## 2021-07-03 NOTE — ED Provider Notes (Signed)
Emergency Medicine Provider Triage Evaluation Note  Ryan [name]. , a 65 y.o. male  was evaluated in triage.  Pt complains of right flank pain and constipation.  He states that last week he had lithotripsy for a right-sided kidney stone.  He states that he was sent home with pain medication, oxycodone.  He states that since then he has not been able to have a bowel movement since Wednesday.  He states that he has not passed gas in "a few days."  He then began having nausea and vomiting this morning.  Has had multiple episodes and decreased ability to tolerate p.o. fluids.  He continues to have some abdominal pain and subjective bloating.  He denies fevers, urinary symptoms, hematuria.  Review of Systems  Positive: Right flank pain, abdominal pain, constipation, nausea and vomiting Negative: Diarrhea, fever  Physical Exam  BP (!) 210/99 (BP Location: Left Arm)   Pulse 89   Temp 98.6 F (37 C) (Oral)   Resp 19   SpO2 98%  Gen:   Awake, no distress   Resp:  Normal effort  MSK:   Moves extremities without difficulty  Other:  Abdomen is protuberant, soft.  Diffuse tenderness to palpation.  No rebound tenderness, negative McBurney's, negative Murphy's.  Bowel sounds on the right upper and right lower quadrant.  There are decreased to absent bowel sounds on the left upper and left lower quadrants.  Right CVA tenderness.  Medical Decision Making  Medically screening exam initiated at 6:08 PM.  Appropriate orders placed.  [name] was informed that the remainder of the evaluation will be completed by another provider, this initial triage assessment does not replace that evaluation, and the importance of remaining in the ED until their evaluation is complete.     Mickie Hillier, PA-C 07/03/21 1810    Godfrey Pick, MD 07/04/21 [phone]

## 2021-07-04 ENCOUNTER — Ambulatory Visit (HOSPITAL_COMMUNITY): Admission: RE | Admit: 2021-07-04 | Payer: 59 | Source: Ambulatory Visit

## 2021-07-04 ENCOUNTER — Ambulatory Visit (HOSPITAL_COMMUNITY): Payer: 59 | Admitting: Anesthesiology

## 2021-07-04 ENCOUNTER — Encounter (HOSPITAL_COMMUNITY)
Admission: AD | Disposition: A | Discharge status: Home / Self Care | Payer: Self-pay | Source: Ambulatory Visit | Attending: Urology

## 2021-07-04 ENCOUNTER — Other Ambulatory Visit: Payer: Self-pay | Admitting: Urology

## 2021-07-04 ENCOUNTER — Ambulatory Visit (HOSPITAL_COMMUNITY): Payer: 59

## 2021-07-04 ENCOUNTER — Encounter (HOSPITAL_COMMUNITY): Payer: Self-pay | Admitting: Urology

## 2021-07-04 ENCOUNTER — Ambulatory Visit (HOSPITAL_COMMUNITY)
Admission: AD | Admit: 2021-07-04 | Discharge: 2021-07-04 | Disposition: A | Discharge status: Home / Self Care | Payer: 59 | Source: Ambulatory Visit | Attending: Urology | Admitting: Urology

## 2021-07-04 DIAGNOSIS — K5900 Constipation, unspecified: Secondary | ICD-10-CM | POA: Insufficient documentation

## 2021-07-04 DIAGNOSIS — N132 Hydronephrosis with renal and ureteral calculous obstruction: Secondary | ICD-10-CM | POA: Insufficient documentation

## 2021-07-04 DIAGNOSIS — I2510 Atherosclerotic heart disease of native coronary artery without angina pectoris: Secondary | ICD-10-CM | POA: Insufficient documentation

## 2021-07-04 DIAGNOSIS — N200 Calculus of kidney: Secondary | ICD-10-CM

## 2021-07-04 DIAGNOSIS — I340 Nonrheumatic mitral (valve) insufficiency: Secondary | ICD-10-CM | POA: Insufficient documentation

## 2021-07-04 DIAGNOSIS — F17200 Nicotine dependence, unspecified, uncomplicated: Secondary | ICD-10-CM | POA: Insufficient documentation

## 2021-07-04 DIAGNOSIS — I509 Heart failure, unspecified: Secondary | ICD-10-CM | POA: Insufficient documentation

## 2021-07-04 DIAGNOSIS — I110 Hypertensive heart disease with heart failure: Secondary | ICD-10-CM | POA: Insufficient documentation

## 2021-07-04 HISTORY — PX: CYSTOSCOPY W/ URETERAL STENT PLACEMENT: SHX1429

## 2021-07-04 SURGERY — CYSTOSCOPY, WITH RETROGRADE PYELOGRAM AND URETERAL STENT INSERTION
Anesthesia: General | Laterality: Right

## 2021-07-04 MED ORDER — ONDANSETRON HCL 4 MG/2ML IJ SOLN
INTRAMUSCULAR | Status: DC | PRN
  Administered 2021-07-04: 4 mg via INTRAVENOUS

## 2021-07-04 MED ORDER — ACETAMINOPHEN 500 MG PO TABS
1000.0000 mg | ORAL_TABLET | Freq: Once | ORAL | Status: DC
  Filled 2021-07-04: qty 2

## 2021-07-04 MED ORDER — LIDOCAINE HCL URETHRAL/MUCOSAL 2 % EX GEL
CUTANEOUS | Status: AC
  Filled 2021-07-04: qty 30

## 2021-07-04 MED ORDER — FENTANYL CITRATE PF 50 MCG/ML IJ SOSY: 25.0000 ug | PREFILLED_SYRINGE | INTRAMUSCULAR | Status: DC | PRN

## 2021-07-04 MED ORDER — PHENYLEPHRINE 40 MCG/ML (10ML) SYRINGE FOR IV PUSH (FOR BLOOD PRESSURE SUPPORT)
PREFILLED_SYRINGE | INTRAVENOUS | Status: DC | PRN
  Administered 2021-07-04 (×2): 80 ug via INTRAVENOUS
  Administered 2021-07-04: 120 ug via INTRAVENOUS

## 2021-07-04 MED ORDER — SODIUM CHLORIDE 0.9 % IR SOLN
Status: DC | PRN
  Administered 2021-07-04: 3000 mL via INTRAVESICAL

## 2021-07-04 MED ORDER — LIDOCAINE 2% (20 MG/ML) 5 ML SYRINGE
INTRAMUSCULAR | Status: DC | PRN
  Administered 2021-07-04: 60 mg via INTRAVENOUS

## 2021-07-04 MED ORDER — CEFAZOLIN SODIUM-DEXTROSE 2-4 GM/100ML-% IV SOLN
2.0000 g | INTRAVENOUS | Status: AC
  Administered 2021-07-04: 2 g via INTRAVENOUS
  Filled 2021-07-04: qty 100

## 2021-07-04 MED ORDER — SODIUM CHLORIDE 0.9 % IV SOLN
INTRAVENOUS | Status: DC | PRN
  Administered 2021-07-04 (×2): 10 mL

## 2021-07-04 MED ORDER — LACTATED RINGERS IV SOLN: INTRAVENOUS | Status: DC

## 2021-07-04 MED ORDER — PROPOFOL 10 MG/ML IV BOLUS
INTRAVENOUS | Status: AC
  Filled 2021-07-04: qty 20

## 2021-07-04 MED ORDER — KETOROLAC TROMETHAMINE 30 MG/ML IJ SOLN
INTRAMUSCULAR | Status: DC | PRN
  Administered 2021-07-04: 30 mg via INTRAVENOUS

## 2021-07-04 MED ORDER — DEXAMETHASONE SODIUM PHOSPHATE 10 MG/ML IJ SOLN
INTRAMUSCULAR | Status: AC
  Filled 2021-07-04: qty 1

## 2021-07-04 MED ORDER — CHLORHEXIDINE GLUCONATE 0.12 % MT SOLN
15.0000 mL | Freq: Once | OROMUCOSAL | Status: AC
  Administered 2021-07-04: 15 mL via OROMUCOSAL

## 2021-07-04 MED ORDER — LIDOCAINE HCL URETHRAL/MUCOSAL 2 % EX GEL
CUTANEOUS | Status: DC | PRN
  Administered 2021-07-04: 1

## 2021-07-04 MED ORDER — PROPOFOL 10 MG/ML IV BOLUS
INTRAVENOUS | Status: DC | PRN
  Administered 2021-07-04: 200 mg via INTRAVENOUS

## 2021-07-04 MED ORDER — LIDOCAINE HCL (PF) 2 % IJ SOLN
INTRAMUSCULAR | Status: AC
  Filled 2021-07-04: qty 5

## 2021-07-04 MED ORDER — EPHEDRINE SULFATE-NACL 50-0.9 MG/10ML-% IV SOSY
PREFILLED_SYRINGE | INTRAVENOUS | Status: DC | PRN
  Administered 2021-07-04: 5 mg via INTRAVENOUS

## 2021-07-04 MED ORDER — DEXAMETHASONE SODIUM PHOSPHATE 10 MG/ML IJ SOLN
INTRAMUSCULAR | Status: DC | PRN
  Administered 2021-07-04: 4 mg via INTRAVENOUS

## 2021-07-04 MED ORDER — ONDANSETRON HCL 4 MG/2ML IJ SOLN
INTRAMUSCULAR | Status: AC
  Filled 2021-07-04: qty 2

## 2021-07-04 SURGICAL SUPPLY — 18 items
BAG URO CATCHER STRL LF (MISCELLANEOUS) ×3 IMPLANT
BASKET ZERO TIP NITINOL 2.4FR (BASKET) IMPLANT
BSKT STON RTRVL ZERO TP 2.4FR (BASKET)
CATH URETL OPEN 5X70 (CATHETERS) ×3 IMPLANT
CLOTH BEACON ORANGE TIMEOUT ST (SAFETY) ×3 IMPLANT
EXTRACTOR STONE 1.7FRX115CM (UROLOGICAL SUPPLIES) IMPLANT
GLOVE SURG ENC TEXT LTX SZ7.5 (GLOVE) ×3 IMPLANT
GOWN STRL REUS W/TWL XL LVL3 (GOWN DISPOSABLE) ×3 IMPLANT
GUIDEWIRE ANG ZIPWIRE 038X150 (WIRE) IMPLANT
GUIDEWIRE STR DUAL SENSOR (WIRE) ×3 IMPLANT
MANIFOLD NEPTUNE II (INSTRUMENTS) ×3 IMPLANT
PACK CYSTO (CUSTOM PROCEDURE TRAY) ×3 IMPLANT
SHEATH NAVIGATOR HD 11/13X28 (SHEATH) IMPLANT
SHEATH NAVIGATOR HD 11/13X36 (SHEATH) IMPLANT
STENT URET 6FRX26 CONTOUR (STENTS) ×2 IMPLANT
TUBING CONNECTING 10 (TUBING) ×2 IMPLANT
TUBING CONNECTING 10' (TUBING) ×1
TUBING UROLOGY SET (TUBING) ×3 IMPLANT

## 2021-07-04 NOTE — Interval H&P Note (Signed)
History and Physical Interval Note:  07/04/2021 5:34 PM  Ryan [name].  has presented today for surgery, with the diagnosis of right ureteral stone.  The various methods of treatment have been discussed with the patient and family. After consideration of risks, benefits and other options for treatment, the patient has consented to  Procedure(s): CYSTOSCOPY WITH RETROGRADE PYELOGRAM/URETERAL STENT PLACEMENT (Right) as a surgical intervention.  The patient's history has been reviewed, patient examined, no change in status, stable for surgery.  I have reviewed the patient's chart and labs.  Questions were answered to the patient's satisfaction.     Ardis Hughs

## 2021-07-04 NOTE — H&P (Signed)
65 year old male who presents today for evaluation of right flank pain. The patient was treated with shockwave lithotripsy 1 week ago. Since that time he has had ongoing and persistent pain. An x-ray several days ago demonstrating distal sinus tarsi. In addition, the patient has been constipated despite his aggressive maneuvers with MiraLAX, suppositories, enemas etc.   The patient denies any hematuria or dysuria. He denies any voiding symptoms. He is having occasional radiation down to the testicle.     ALLERGIES: None   MEDICATIONS: Aspirin 81 mg tablet,chewable  Dulcolax 400 mg/5 ml suspension, oral  Dulcolax 10 mg suppository, rectal  Fish Oil  Hydrocodone-Acetaminophen 5 mg-325 mg tablet 1 tablet PO Q 4 H PRN  Losartan Potassium 25 mg tablet 1 tablet PO Daily  Miralax 17 gram/dose powder  Ondansetron Hcl 4 mg tablet 1 tablet PO Q 6 H     GU PSH: Cystoscopy - 12/12/2020 ESWL - 06/26/2021 Locm 300-399Mg /Ml Iodine,1Ml - 12/01/2020     NON-GU PSH: Tonsillectomy - 1967     GU PMH: Renal calculus - 06/29/2021, - 06/09/2021, - 12/12/2020, - 11/09/2020 Ureteral calculus - 06/29/2021 Gross hematuria - 06/09/2021, - 12/12/2020, - 12/01/2020, - 11/09/2020    NON-GU PMH: GERD Hypercholesterolemia Hypertension Skin Cancer, History Sleep Apnea    FAMILY HISTORY: 1 Daughter - Other   SOCIAL HISTORY: Marital Status: Married Preferred Language: English; Ethnicity: Not Hispanic Or Latino; Race: White Current Smoking Status: Unknown if patient smokes.   Tobacco Use Assessment Completed: Used Tobacco in last 30 days? Does not use smokeless tobacco. Drinks 3 drinks per week.  Does not use drugs. Drinks 2 caffeinated drinks per day. Patient's occupation is/was Retired.    REVIEW OF SYSTEMS:    GU Review Male:   Patient denies frequent urination, hard to postpone urination, burning/ pain with urination, get up at night to urinate, leakage of urine, stream starts and stops, trouble starting  your stream, have to strain to urinate , erection problems, and penile pain.  Gastrointestinal (Upper):   Patient reports nausea and vomiting. Patient denies indigestion/ heartburn.  Gastrointestinal (Lower):   Patient reports constipation. Patient denies diarrhea.  Constitutional:   Patient denies fever, night sweats, weight loss, and fatigue.  Skin:   Patient denies skin rash/ lesion and itching.  Eyes:   Patient denies double vision and blurred vision.  Ears/ Nose/ Throat:   Patient denies sore throat and sinus problems.  Hematologic/Lymphatic:   Patient denies swollen glands and easy bruising.  Cardiovascular:   Patient denies leg swelling and chest pains.  Respiratory:   Patient denies cough and shortness of breath.  Endocrine:   Patient denies excessive thirst.  Musculoskeletal:   Patient denies back pain and joint pain.  Neurological:   Patient denies headaches and dizziness.  Psychologic:   Patient denies depression and anxiety.   VITAL SIGNS:      07/04/2021 09:34 AM  Weight 220 lb / 99.79 kg  Height 69 in / 175.26 cm  BP 139/88 mmHg  Pulse 102 /min  Temperature 98.6 F / 37 C  BMI 32.5 kg/m   MULTI-SYSTEM PHYSICAL EXAMINATION:    Constitutional: Well-nourished. No physical deformities. Normally developed. Good grooming.   Respiratory: Normal breath sounds. No labored breathing, no use of accessory muscles.   Cardiovascular: Regular rate and rhythm. No murmur, no gallop. Normal temperature, normal extremity pulses, no swelling, no varicosities.      Complexity of Data:  Source Of History:  Patient  Records Review:  Previous Doctor Records, Previous Patient Records, POC Tool  Urine Test Review:   Urinalysis  X-Ray Review: C.T. Abdomen/Pelvis: Reviewed Films. Discussed With Patient.     PROCEDURES:         KUB - [id number]  A single view of the abdomen is obtained. Renal shadows are easily visualized bilaterally. Large Steinstrasse a overlying L3 on the right. In addition,  there is a large nonobstructing stone in the right lower pole. There are no additional calcifications along the expected location of either ureter bilaterally.  Gas pattern is grossly normal. No significant bony abnormalities.      Impression: Steinstrasse in the right proximal ureter, and a nonobstructing stone in the right lower pole.           Urinalysis w/Scope - 81001 Dipstick Dipstick Cont'd Micro  Color: Amber Bilirubin: Neg WBC/hpf: 0 - 5/hpf  Appearance: Clear Ketones: Trace RBC/hpf: 0 - 2/hpf  Specific Gravity: >1.030 Blood: Neg Bacteria: Mod (26-50/hpf)  pH: 5.5 Protein: 1+ Cystals: NS (Not Seen)  Glucose: Neg Urobilinogen: 0.2 Casts: Hyaline    Nitrites: Neg Trichomonas: Not Present    Leukocyte Esterase: Neg Mucous: Present      Epithelial Cells: NS (Not Seen)      Yeast: NS (Not Seen)      Sperm: Not Present    Notes:            Ketoralac 60mg  - [id number], [id number] Qty: 60 Adm. By: Pricilla Riffle  Unit: mg Lot No [id number]  Route: IM Exp. Date 06/27/2022  Freq: None Mfgr.:   Site: Right Buttock   ASSESSMENT:      ICD-10 Details  1 GU:   Ureteral calculus - N20.1    PLAN:           Orders Labs Urine Culture  X-Rays: KUB          Schedule         Document Letter(s):  Created for Patient: Clinical Summary    The patient presents today with an infected stone or very poorly controlled pain. Given their circumstance I recommended that we proceed urgently to the OR today for stent placement. This will allow the kidney to drain along with any associated infection and will help control their pain. They understand that the stent is temporary and the patient will require an additional procedure to treat the stone. I explained the procedure with the patient and described the expected outcome. I also explained to them the stent discomfort and potential for worsening voiding symptoms. We will see how the do before, during and after the procedure and decide whether the  patient will require admission to the hospital.         Notes:   And proceed with a right ureteral stent given the patient's Steinstrasse and lack of progress over the course of the last week. He understands that following this he is likely to need an additional procedure. We will discuss this with Dr. Milford Cage his primary urologist.

## 2021-07-04 NOTE — Transfer of Care (Signed)
Immediate Anesthesia Transfer of Care Note  Patient: Ryan [name].  Procedure(s) Performed: Procedure(s): CYSTOSCOPY WITH RETROGRADE PYELOGRAM/DIAGNOSTIC URETEROSCOPY/URETERAL STENT PLACEMENT (Right)  Patient Location: PACU  Anesthesia Type:General  Level of Consciousness: Alert, Awake, Oriented  Airway & Oxygen Therapy: Patient Spontanous Breathing  Post-op Assessment: Report given to RN  Post vital signs: Reviewed and stable  Last Vitals:  Vitals:   07/04/21 1604  BP: (!) 165/78  Pulse: 81  Resp: 16  Temp: 36.9 C  SpO2: 78%    Complications: No apparent anesthesia complications

## 2021-07-04 NOTE — Op Note (Signed)
Preoperative diagnosis:  Right Steinstrasse, obstructing ureteral stones  Postoperative diagnosis:  Same  Procedure:  Cystoscopy Right diagnostic ureteroscopy right ureteral stent placement right retrograde pyelography with interpretation   Surgeon: Ardis Hughs, MD  Anesthesia: General  Complications: None  Intraoperative findings:   #1: The patient had a high median bar/median lobe making it difficult to easily access the trigone and right ureteral orifice. #2: The patient's retrograde pyelogram demonstrated normal caliber ureter to the proximal ureter where there was a small very narrow area followed by severe hydronephrosis and several filling defects consistent with the patient's known obstructing ureteral stones.  The calyces were sharp, but dilated. #3: Passing the safety wire up after performing the retrograde pyelogram proved difficult, I was unable to get it past the area where the stones were obstructing initially.  I did ultimately create a false passage in this area through the transmural ureter. #4: Using a single lumen for semirigid ureteroscope I was able to easily cannulate the patient's right ureter and advanced it to the midportion of the ureter where I was then able to reestablish the lumen of the ureter at the level of the obstruction, and then ultimately able to get it up into the renal pelvis. #5: A 26 cm time 6 French double-J ureteral stent was placed in the right ureter.  EBL: Minimal  Specimens: None  Indication: Ryan [name]. is a 65 y.o. patient with large UPJ/proximal ureteral stone who underwent shockwave lithotripsy approximately 1 week ago.  He subsequently developed a Steinstrasse in the right ureter and after 5 days it was noted that the stones had not moved.   After reviewing the management options for treatment, he elected to proceed with the above surgical procedure(s). We have discussed the potential benefits and risks of the procedure,  side effects of the proposed treatment, the likelihood of the patient achieving the goals of the procedure, and any potential problems that might occur during the procedure or recuperation. Informed consent has been obtained.  Description of procedure:  The patient was taken to the operating room and general anesthesia was induced.  The patient was placed in the dorsal lithotomy position, prepped and draped in the usual sterile fashion, and preoperative antibiotics were administered. A preoperative time-out was performed.   Cystourethroscopy was performed.  The patient's urethra was examined and was normal  demonstrated bilobar prostatic hypertrophy with a median lobe. The bladder was then systematically examined in its entirety. There was no evidence for any bladder tumors, stones, or other mucosal pathology.    Attention then turned to the rightureteral orifice and a ureteral catheter was used to intubate the ureteral orifice.  Omnipaque contrast was injected through the ureteral catheter and a retrograde pyelogram was performed with findings as dictated above.  A 0.38 sensor guidewire was then advanced up the right ureter but I was unable to get the wire to advance beyond the stones.  I tried advancing the open-ended catheter up to the stones and using gentle pressure to get the wire beyond the stones.  However, this only created a false passage through the medial wall of the ureter.  Realizing this I removed the wire and advanced a semirigid single lumen ureteroscope through the patient's urethra and then cannulated the right ureteral orifice.  I advanced this up to the midportion of the ureter where I did have difficulty advancing the scope any further, but was able to advance the wire up through the scope and ultimately reestablish the  lumen of the right ureter.  There was still contrast in the renal pelvis and wire placement confirmation was obtained using fluoroscopy.  I then backed out the  ureteroscope leaving the wire behind.  I then removed the ureteroscope and exchanged it for the 21 French cystoscope.    The wire was then backloaded through the cystoscope and a ureteral stent was advance over the wire using Seldinger technique.  The stent was positioned appropriately under fluoroscopic and cystoscopic guidance.  The wire was then removed with an adequate stent curl noted in the renal pelvis as well as in the bladder.  The bladder was then emptied and the procedure ended.  The patient appeared to tolerate the procedure well and without complications.  The patient was able to be awakened and transferred to the recovery unit in satisfactory condition.    Ardis Hughs, M.D.

## 2021-07-04 NOTE — Anesthesia Preprocedure Evaluation (Addendum)
Anesthesia Evaluation  Patient identified by MRN, date of birth, ID band Patient awake    Reviewed: Allergy & Precautions, NPO status , Patient's Chart, lab work & pertinent test results  Airway Mallampati: III  TM Distance: >3 FB Neck ROM: Full    Dental  (+) Dental Advisory Given, Chipped,    Pulmonary sleep apnea , Current Smoker and Patient abstained from smoking.,    Pulmonary exam normal breath sounds clear to auscultation       Cardiovascular hypertension, Pt. on medications + angina + CAD and +CHF  Normal cardiovascular exam Rhythm:Regular Rate:Normal  TTE 2021 1. Left ventricular ejection fraction, by estimation, is 60 to 65%. The  left ventricle has normal function. The left ventricle has no regional  wall motion abnormalities. Left ventricular diastolic parameters are  consistent with Grade II diastolic  dysfunction (pseudonormalization).  2. Right ventricular systolic function is normal. The right ventricular  size is normal. Tricuspid regurgitation signal is inadequate for assessing  PA pressure.  3. Left atrial size was mildly dilated.  4. Mild mitral valve regurgitation.   Cath 2019 1. Mild to moderate coronary artery disease, with long 50% stenosis of the mid/distal LAD and 20% ostial LCx disease. 2. Low normal left ventricular contraction (LVEF 50-55%). 3. Upper normal left ventricular filling pressure (LVEDP 15 mmHg).    Neuro/Psych negative neurological ROS  negative psych ROS   GI/Hepatic Neg liver ROS, GERD  ,  Endo/Other  negative endocrine ROS  Renal/GU negative Renal ROS  negative genitourinary   Musculoskeletal negative musculoskeletal ROS (+)   Abdominal   Peds  Hematology negative hematology ROS (+)   Anesthesia Other Findings   Reproductive/Obstetrics                          Anesthesia Physical Anesthesia Plan  ASA: 3  Anesthesia Plan: General    Post-op Pain Management:    Induction: Intravenous  PONV Risk Score and Plan: Ondansetron, Dexamethasone and Midazolam  Airway Management Planned: LMA  Additional Equipment:   Intra-op Plan:   Post-operative Plan: Extubation in OR  Informed Consent: I have reviewed the patients History and Physical, chart, labs and discussed the procedure including the risks, benefits and alternatives for the proposed anesthesia with the patient or authorized representative who has indicated his/her understanding and acceptance.     Dental advisory given  Plan Discussed with: CRNA  Anesthesia Plan Comments:         Anesthesia Quick Evaluation

## 2021-07-04 NOTE — Anesthesia Postprocedure Evaluation (Signed)
Anesthesia Post Note  Patient: Ryan [name].  Procedure(s) Performed: CYSTOSCOPY WITH RETROGRADE PYELOGRAM/DIAGNOSTIC URETEROSCOPY/URETERAL STENT PLACEMENT (Right)     Patient location during evaluation: PACU Anesthesia Type: General Level of consciousness: awake and alert Pain management: pain level controlled Vital Signs Assessment: post-procedure vital signs reviewed and stable Respiratory status: spontaneous breathing, nonlabored ventilation, respiratory function stable and patient connected to nasal cannula oxygen Cardiovascular status: blood pressure returned to baseline and stable Postop Assessment: no apparent nausea or vomiting Anesthetic complications: no   No notable events documented.  Last Vitals:  Vitals:   07/04/21 1915 07/04/21 1930  BP:    Pulse:  78  Resp:  17  Temp:    SpO2: 98% 99%    Last Pain:  Vitals:   07/04/21 1930  TempSrc:   PainSc: 0-No pain                 Tauni Sanks L Starnisha Batrez

## 2021-07-04 NOTE — Discharge Instructions (Signed)
DISCHARGE INSTRUCTIONS FOR KIDNEY STONE/URETERAL STENT   MEDICATIONS:  1.  Resume all your other meds from home - except do not take any extra narcotic pain meds that you may have at home.    ACTIVITY:  1. No strenuous activity x 1week  2. No driving while on narcotic pain medications  3. Drink plenty of water  4. Continue to walk at home - you can still get blood clots when you are at home, so keep active, but don't over do it.  5. May return to work/school tomorrow or when you feel ready   BATHING:  1. You can shower and we recommend daily showers   SIGNS/SYMPTOMS TO CALL:  Please call us if you have a fever greater than 101.5, uncontrolled nausea/vomiting, uncontrolled pain, dizziness, unable to urinate, bloody urine, chest pain, shortness of breath, leg swelling, leg pain, redness around wound, drainage from wound, or any other concerns or questions.   You can reach [place] at [phone].   FOLLOW-UP:  1. You will be contacted for f/u ureteroscopy for removal of the stone by Dr. Elmyra Ricks scheduler.

## 2021-07-04 NOTE — Anesthesia Procedure Notes (Signed)
Procedure Name: LMA Insertion Date/Time: 07/04/2021 5:46 PM Performed by: Milford Cage, CRNA Pre-anesthesia Checklist: Patient identified, Emergency Drugs available, Suction available and Patient being monitored Patient Re-evaluated:Patient Re-evaluated prior to induction Oxygen Delivery Method: Circle system utilized Preoxygenation: Pre-oxygenation with 100% oxygen Induction Type: IV induction Ventilation: Mask ventilation without difficulty LMA: LMA with gastric port inserted LMA Size: 4.0 Number of attempts: 1 Tube secured with: Tape Dental Injury: Teeth and Oropharynx as per pre-operative assessment

## 2021-07-05 ENCOUNTER — Encounter (HOSPITAL_COMMUNITY): Payer: Self-pay | Admitting: Urology

## 2021-07-12 ENCOUNTER — Other Ambulatory Visit: Payer: Self-pay | Admitting: Urology

## 2021-07-13 NOTE — Patient Instructions (Addendum)
DUE TO COVID-19 ONLY ONE VISITOR IS ALLOWED TO COME WITH YOU AND STAY IN THE WAITING ROOM ONLY DURING PRE OP AND PROCEDURE DAY OF SURGERY IF YOU ARE GOING HOME AFTER SURGERY. IF YOU ARE SPENDING THE NIGHT 2 PEOPLE MAY VISIT WITH YOU IN YOUR PRIVATE ROOM AFTER SURGERY UNTIL VISITING  HOURS ARE OVER AT 800 PM AND 1  VISITOR  MAY  SPEND THE NIGHT.                  Ryan [name].     Your procedure is scheduled on: 07/25/21   Report to [facility] Main  Entrance   Report to admitting at  8:30 AM     Call this number if you have problems the morning of surgery [phone]    Remember: Do not eat food or drink :After Midnight the night before your surgery,        BRUSH YOUR TEETH MORNING OF SURGERY AND RINSE YOUR MOUTH OUT, NO CHEWING GUM CANDY OR MINTS.     Take these medicines the morning of surgery with A SIP OF WATER: Tamsulosin  Stop taking _ASA 81__________on _11/23_________as instructed by _____________.                                  You may not have any metal on your body including              piercings  Do not wear jewelry, lotions, powders or deodorant             Men may shave face and neck.   Do not bring valuables to the hospital. [place].  Contacts, dentures or bridgework may not be worn into surgery.     Patients discharged the day of surgery will not be allowed to drive home.  IF YOU ARE HAVING SURGERY AND GOING HOME THE SAME DAY, YOU MUST HAVE AN ADULT TO DRIVE YOU HOME AND BE WITH YOU FOR 24 HOURS. YOU MAY GO HOME BY TAXI OR UBER OR ORTHERWISE, BUT AN ADULT MUST ACCOMPANY YOU HOME AND STAY WITH YOU FOR 24 HOURS.  Name and phone number of your driver:  Special Instructions: N/A              Please read over the following fact sheets you were given: _____________________________________________________________________             [facility] - Preparing for Surgery Before surgery, you can  play an important role.  Because skin is not sterile, your skin needs to be as free of germs as possible.  You can reduce the number of germs on your skin by washing with CHG (chlorahexidine gluconate) soap before surgery.  CHG is an antiseptic cleaner which kills germs and bonds with the skin to continue killing germs even after washing. Please DO NOT use if you have an allergy to CHG or antibacterial soaps.  If your skin becomes reddened/irritated stop using the CHG and inform your nurse when you arrive at Short Stay.  You may shave your face/neck. Please follow these instructions carefully:  1.  Shower with CHG Soap the night before surgery and the  morning of Surgery.  2.  If you choose to wash your hair, wash your hair first as usual with your  normal  shampoo.  3.  After you shampoo, rinse your hair and body thoroughly to remove the  shampoo.                            4.  Use CHG as you would any other liquid soap.  You can apply chg directly  to the skin and wash                       Gently with a scrungie or clean washcloth.  5.  Apply the CHG Soap to your body ONLY FROM THE NECK DOWN.   Do not use on face/ open                           Wound or open sores. Avoid contact with eyes, ears mouth and genitals (private parts).                       Wash face,  Genitals (private parts) with your normal soap.             6.  Wash thoroughly, paying special attention to the area where your surgery  will be performed.  7.  Thoroughly rinse your body with warm water from the neck down.  8.  DO NOT shower/wash with your normal soap after using and rinsing off  the CHG Soap.                9.  Pat yourself dry with a clean towel.            10.  Wear clean pajamas.            11.  Place clean sheets on your bed the night of your first shower and do not  sleep with pets. Day of Surgery : Do not apply any lotions/deodorants the morning of surgery.  Please wear clean clothes to the hospital/surgery  center.  FAILURE TO FOLLOW THESE INSTRUCTIONS MAY RESULT IN THE CANCELLATION OF YOUR SURGERY PATIENT SIGNATURE_________________________________  NURSE SIGNATURE__________________________________  ________________________________________________________________________

## 2021-07-14 ENCOUNTER — Encounter (HOSPITAL_COMMUNITY)
Admission: RE | Admit: 2021-07-14 | Discharge: 2021-07-14 | Disposition: A | Discharge status: Home / Self Care | Payer: 59 | Source: Ambulatory Visit | Attending: Urology | Admitting: Urology

## 2021-07-14 ENCOUNTER — Other Ambulatory Visit: Payer: Self-pay

## 2021-07-14 ENCOUNTER — Encounter (HOSPITAL_COMMUNITY): Payer: Self-pay

## 2021-07-14 VITALS — BP 148/88 | HR 71 | Temp 97.7°F | Resp 18 | Ht 69.0 in | Wt 223.0 lb

## 2021-07-14 DIAGNOSIS — Z01818 Encounter for other preprocedural examination: Secondary | ICD-10-CM | POA: Insufficient documentation

## 2021-07-14 DIAGNOSIS — Z87448 Personal history of other diseases of urinary system: Secondary | ICD-10-CM

## 2021-07-14 NOTE — Progress Notes (Signed)
COVID test-NA   PCP - Dr. Inda Castle Cardiologist - Dr. Saunders Revel  Chest x-ray - no EKG - 01/26/21-epic Stress Test - 2003 ECHO - 11/04/20-epic Cardiac Cath - 10/29/17-epic Pacemaker/ICD device last checked:NA  Sleep Study - yes CPAP - yes  Fasting Blood Sugar - NA Checks Blood Sugar _____ times a day  Blood Thinner Instructions:ASA 81/ Dr. Saunders Revel Aspirin Instructions:Stop 5 days prior to DOS/ Dr. Milford Cage Last Dose:07/19/21  Anesthesia review: yes  Patient denies shortness of breath, fever, cough and chest pain at PAT appointment Pt has no SOB with any activities. He feels much better after getting a C-PAP.  Patient verbalized understanding of instructions that were given to them at the PAT appointment. Patient was also instructed that they will need to review over the PAT instructions again at home before surgery. yes

## 2021-07-24 NOTE — H&P (Signed)
Patient is a 65 year old white male seen today for evaluation of gross painless hematuria. Patient noted episode on 09/30/2020 with a dark brown color to his urine. Denies any significant urinary symptomatology. Patient had urine culture sent was negative. He was treated with Cipro by his PCP. Does have cigar smoking history 2 cigars a month for several years. No cigarette smoking. Patient states he has a known right-sided kidney stone which was diagnosed within the last year with a plain x-ray by his report. He has never required any intervention for stones. Had recent PSA was 2.32. Here for evaluation of hematuria.  Micro urinalysis is clear on urine spun sediment today.   -12/12/20-patient with history of gross painless hematuria as above. He has had CT renal scan in the interim which shows 11 x 8 mm right lower pole renal calculus which is nonobstructing. Also has small left renal cyst. In the GI tract there is something in the terminal ileum suggestive of possible mass and the suspicion of lesions such as carcinoid tumor is in the body of the report. Also has a lower will pole lung nodule on the right.  Cysto is performed today and shows no obvious bladder lesions. Mild bilobar prostatic hypertrophy.    CLINICAL DATA: Gross hematuria in a 65 year old male also with  history of prior nonmelanomatous skin cancer by report. Also  reporting back pain. Last episode of gross hematuria 09/30/2020.   EXAM:  CT ABDOMEN AND PELVIS WITHOUT AND WITH CONTRAST   TECHNIQUE:  Multidetector CT imaging of the abdomen and pelvis was performed  following the standard protocol before and following the bolus  administration of intravenous contrast.   CONTRAST: 125 mL Omnipaque 300   COMPARISON: No recent comparisons. Prior ultrasound from 2008.   FINDINGS:  Lower chest: Incidental imaging of the lung bases with pulmonary  nodule in the RIGHT lower lobe 6 x 5 mm (image 4, series 3) no  effusion. No  consolidation.   Granuloma with calcification in the RIGHT middle lobe.   Hepatobiliary: Small hepatic cysts. No focal, suspicious hepatic  lesion. Portal vein is patent. No pericholecystic stranding. No  biliary duct dilation.   Pancreas: Normal, without mass, inflammation or ductal dilatation.   Spleen: Spleen is normal size and contour.   Adrenals/Urinary Tract: Adrenal glands are normal.   11 x 8 mm RIGHT lower pole intrarenal calculus. No hydronephrosis.  No suspicious enhancement along the course of LEFT or RIGHT ureters.  Subtle variable density in the LEFT renal pelvis on image 40 of  series 5. This is branching and appears to be renal collecting  system elements, bifid renal pelvis splayed around a renal sinus  cyst. No nodular filling defect on excretory images. Enhancement of  urothelium along the ureters is quite subtle and nonnodular and  without filling defect. The enhancement about the LEFT renal pelvis  is more pronounced.   Renal sinus cysts.   Cortical cysts on the LEFT.   Stomach/Bowel: Stomach and small bowel without acute process. Colon  largely stool filled. No pericolonic stranding. Normal appendix.   Nodular enhancement is however noted in the area of the terminal  ileum. No perienteric stranding. This may measure as much as 1.9 x  1.5 cm and is not associated with obstruction. Mild mural  stratification seen just upstream from this loop of bowel. Area also  identified on coronal reconstructions 1.8 x 1.1 cm on image 56 of  coronal reconstruction 604. Small ileocolic lymph nodes are  nonspecific.  Vascular/Lymphatic: Normal caliber abdominal aorta with minimal  atherosclerotic plaque. Smooth contour of the IVC. There is no  gastrohepatic or hepatoduodenal ligament lymphadenopathy. No  retroperitoneal or mesenteric lymphadenopathy.   No pelvic sidewall lymphadenopathy.   Reproductive: Prostate mildly heterogeneous.   Other: Small fat containing  umbilical and LEFT inguinal hernias. No  ascites. No free air.   Musculoskeletal: No acute musculoskeletal process or destructive  bone finding.   Excretory phase: On excretory phase the area of enhancement which is  Y shaped configured ears   IMPRESSION:  1. Nodular enhancement in the area of the terminal ileum. This may  measure as much as 1.9 x 1.5 cm and is not associated with  obstruction. Findings are suspicious for small tumor in this  location such as carcinoid tumor of the small bowel. Consider  endoscopic assessment as this is immediately proximal to the  ileocecal valve.  2. Nonnodular enhancement just proximal to this area raise the  question of ileal inflammation. It is possible that these findings  could be related to ileitis though the area near the terminal ileum  has a more masslike appearance.  3. Subtle variable density in the LEFT renal pelvis on image 40 of  series 5. This may be related to underlying inflammation as it is  non focal. Correlate with any recent urinary tract infection. Given  that it is however more prominent than in other areas could consider  ureteroscopic assessment as warranted for further evaluation or  short interval follow-up.  4. Very limited assessment of the urinary bladder  5. 11 x 8 mm RIGHT lower pole intrarenal calculus. No  hydronephrosis.  6. 6 x 5 mm pulmonary nodule in the RIGHT lower lobe.  7. Aortic atherosclerosis.   These results will be called to the ordering clinician or  representative by the Radiologist Assistant, and communication  documented in the PACS or [employer].   Aortic Atherosclerosis (ICD10-I70.0).   Electronically Signed:  By: Zetta Bills M.D.  On: 12/02/2020 11:57   ADDENDUM REPORT: 12/02/2020 17:12   ADDENDUM:  Regarding the RIGHT lower lobe nodule suggest short interval  follow-up or fol with history of right renal calculus as above. Underwent ESL by Dr. Gloriann Loan on 06/27/2021 er evaluation  of ileal findings. Fleischner society  guidelines are applicable in the absence of known malignancy.  Non-contrast chest CT at 6-12 months is recommended. If the nodule  is stable at time of repeat CT, then future CT at 18-24 months (from  today's scan) is considered optional for low-risk patients, but is  recommended for high-risk patients. This recommendation follows the  consensus statement: Guidelines for Management of Incidental  Pulmonary Nodules Detected on CT Images: From the Fleischner Society  2017; Radiology 2017; 284:228-243.   These results will be called to the ordering clinician or  representative by the Radiologist Assistant, and communication  documented in the PACS or [employer].    Electronically Signed  By: Zetta Bills M.D.  On: 12/02/2020 17:12   -06/09/21-patient with history of gross hematuria and right renal calculus as above. Also had incidental finding of intestinal mass which ended up being carcinoid tumor. Underwent robotic partial colectomy on 05/10/2021 and is making good recovery from this. Not had any significant hematuria in the interim. He is here now for follow-up of his stone management.  Micro urinalysis today is clear and urine spun sediment  KUB is reviewed today and shows: No obvious bony abnormalities. There is a approximate 9 to 10  mm calcification overlying the lower pole of the right kidney shadow consistent with the stone seen on prior CT scan. No other calcifications noted overlying the renal shadows or over the expected course of the ureters.   -06/29/21-patient with history of right renal calculus as above. Underwent ESL on 06/26/2021 by Dr. Gloriann Loan. He had significant postprocedural pain was seen in the ER yesterday with what appears to be fragmented Steinstrasse in the right proximal ureter and also some nonobstructing fragments in the lower calyceal area of the right kidney. Now for follow-up. See CT report below. Only    CLINICAL DATA:  Right flank pain.     EXAM:  CT ABDOMEN AND PELVIS WITHOUT CONTRAST     TECHNIQUE:  Multidetector CT imaging of the abdomen and pelvis was performed  following the standard protocol without IV contrast.     COMPARISON: December 01, 2020     FINDINGS:  Lower chest: Small calcified right hilar lymph nodes are seen.     No acute abnormality.     Hepatobiliary: Stable 8 mm cystic appearing areas are seen within  the posterior aspect of the left lobe of the liver and along the  anterior and posterior aspects of the liver dome. A thin layer of  tiny gallstones is seen within the dependent portion of the  gallbladder lumen. There is no evidence of gallbladder wall  thickening or pericholecystic inflammation.     Pancreas: Unremarkable. No pancreatic ductal dilatation or  surrounding inflammatory changes.     Spleen: Tiny calcified granulomas are seen within an otherwise  normal-appearing spleen.     Adrenals/Urinary Tract: Adrenal glands are unremarkable.     Kidneys are normal in size. 1.2 cm and 2.1 cm cystic appearing areas  are noted within the mid left kidney (axial CT image 38, CT series  2).     A cluster of 3 mm nonobstructing renal stones is seen within the  posterior aspect of the mid to lower right kidney with an additional  cluster of 3 mm renal stones noted within the dependent portion of  the right renal pelvis. Six adjacent subcentimeter obstructing renal  stones are seen packed within the proximal right ureter (best seen  on coronal reformatted image 63, CT series 4). The largest measures  approximately 7 mm. Subsequent mild right-sided hydronephrosis,  hydroureter and perinephric inflammatory fat stranding or present.  Mild left-sided hydronephrosis is seen without evidence of renal  calculi on the left.     Bladder is unremarkable.     Stomach/Bowel: Stomach is within normal limits. Surgically  anastomosed bowel is seen within the region of the hepatic flexure.   No evidence of bowel wall thickening, distention, or inflammatory  changes.     Vascular/Lymphatic: Aortic atherosclerosis. No enlarged abdominal or  pelvic lymph nodes.     Reproductive: Prostate is unremarkable.     Other: No abdominal wall hernia or abnormality. No abdominopelvic  ascites.     Musculoskeletal: No acute or significant osseous findings.     IMPRESSION:  1. Multiple adjacent subcentimeter obstructing ureteral calculi  packed within the proximal right ureter.  2. Mild left-sided hydronephrosis without evidence of left renal  calculi.  3. Cholelithiasis without evidence of acute cholecystitis.  4. Stable cystic appearing areas within the liver and left kidney  which may represent small cysts and/or hemangiomas.  5. Evidence of prior granulomatous disease.  6. Aortic atherosclerosis.     Aortic Atherosclerosis (ICD10-I70.0).  Electronically Signed  By: Virgina Norfolk M.D.  On: 06/28/2021 22:59  -07/24/21-Patient s/p cysto ,insertion Right JJ stent by Dr Louis Meckel 07/04/21 with inadvertent ureteral perforation noted at time of procedure. Has had JJ stent in place, now for attempt at cysto Right ureteroscopy and laser lithotripsy and JJ stent exchange.   ALLERGIES: None   MEDICATIONS: Aspirin 81 mg tablet,chewable  Fish Oil  Losartan Potassium 25 mg tablet 1 tablet PO Daily     GU PSH: Cystoscopy - 12/12/2020 ESWL - 06/26/2021 Locm 300-399Mg /Ml Iodine,1Ml - 12/01/2020     NON-GU PSH: Tonsillectomy - 1967     GU PMH: Gross hematuria - 06/09/2021, - 12/12/2020, - 12/01/2020, - 11/09/2020 Renal calculus - 06/09/2021, - 12/12/2020, - 11/09/2020    NON-GU PMH: GERD Hypercholesterolemia Hypertension Skin Cancer, History Sleep Apnea    FAMILY HISTORY: 1 Daughter - Other   SOCIAL HISTORY: Marital Status: Married Preferred Language: English; Ethnicity: Not Hispanic Or Latino; Race: White Current Smoking Status: Unknown if patient smokes.   Tobacco  Use Assessment Completed: Used Tobacco in last 30 days? Does not use smokeless tobacco. Drinks 3 drinks per week.  Does not use drugs. Drinks 2 caffeinated drinks per day. Patient's occupation is/was Retired.    REVIEW OF SYSTEMS:    GU Review Male:   Patient denies frequent urination, hard to postpone urination, burning/ pain with urination, get up at night to urinate, leakage of urine, stream starts and stops, trouble starting your stream, have to strain to urinate , erection problems, and penile pain.  Gastrointestinal (Upper):   Patient reports nausea. Patient denies vomiting and indigestion/ heartburn.  Gastrointestinal (Lower):   Patient denies diarrhea and constipation.  Constitutional:   Patient denies fever, night sweats, weight loss, and fatigue.  Skin:   Patient denies skin rash/ lesion and itching.  Eyes:   Patient denies blurred vision and double vision.  Ears/ Nose/ Throat:   Patient denies sore throat and sinus problems.  Hematologic/Lymphatic:   Patient denies easy bruising and swollen glands.  Cardiovascular:   Patient denies leg swelling and chest pains.  Respiratory:   Patient denies cough and shortness of breath.  Endocrine:   Patient denies excessive thirst.  Musculoskeletal:   Patient reports back pain. Patient denies joint pain.  Neurological:   Patient denies headaches and dizziness.  Psychologic:   Patient denies depression and anxiety.   VITAL SIGNS: None   MULTI-SYSTEM PHYSICAL EXAMINATION:    Gastrointestinal: No mass, no tenderness, no rigidity, non obese abdomen.Mild Sided CVA tenderness      PAST DATA REVIEW: None   PROCEDURES:          Urinalysis w/Scope - 81001 Dipstick Dipstick Cont'd Micro  Color: Amber Bilirubin: Neg WBC/hpf: NS (Not Seen)  Appearance: Cloudy Ketones: Neg RBC/hpf: 40 - 60/hpf  Specific Gravity: 1.025 Blood: 3+ Bacteria: NS (Not Seen)  pH: 5.5 Protein: 1+ Cystals: NS (Not Seen)  Glucose: Neg Urobilinogen: 0.2 Casts: NS (Not  Seen)    Nitrites: Neg Trichomonas: Not Present    Leukocyte Esterase: Neg Mucous: Present      Epithelial Cells: 0 - 5/hpf      Yeast: NS (Not Seen)      Sperm: Not Present         Ketoralac 60mg  - E4235, 36144 Qty: 60 Adm. By: Kathleen Lime  Unit: mg Lot No [id number]  Route: IM Exp. Date 03/18/2021  Freq: None Mfgr.:   Site: Right Buttock   ASSESSMENT:  ICD-10 Details  1 GU:   Ureteral calculus - B58.3 Acute, Complicated Injury  2   Renal calculus - E94.0 Acute, Complicated Injury   PLAN: Cysto right ureteroscopy with laser lithotripsy and Right JJ stent exchange.           Medications New Meds: Ondansetron Hcl 4 mg tablet 1 tablet PO Q 6 H   #10  1 Refill(s)            Document Letter(s):  Created for Patient: Clinical Summary

## 2021-07-25 ENCOUNTER — Ambulatory Visit (HOSPITAL_COMMUNITY): Payer: 59

## 2021-07-25 ENCOUNTER — Encounter (HOSPITAL_COMMUNITY)
Admission: RE | Disposition: A | Discharge status: Home / Self Care | Payer: Self-pay | Source: Home / Self Care | Attending: Urology

## 2021-07-25 ENCOUNTER — Ambulatory Visit (HOSPITAL_COMMUNITY): Payer: 59 | Admitting: Anesthesiology

## 2021-07-25 ENCOUNTER — Ambulatory Visit (HOSPITAL_COMMUNITY): Payer: 59 | Admitting: Physician Assistant

## 2021-07-25 ENCOUNTER — Ambulatory Visit (HOSPITAL_COMMUNITY)
Admission: RE | Admit: 2021-07-25 | Discharge: 2021-07-25 | Disposition: A | Discharge status: Home / Self Care | Payer: 59 | Attending: Urology | Admitting: Urology

## 2021-07-25 ENCOUNTER — Encounter (HOSPITAL_COMMUNITY): Payer: Self-pay | Admitting: Urology

## 2021-07-25 DIAGNOSIS — I110 Hypertensive heart disease with heart failure: Secondary | ICD-10-CM | POA: Diagnosis not present

## 2021-07-25 DIAGNOSIS — F17200 Nicotine dependence, unspecified, uncomplicated: Secondary | ICD-10-CM | POA: Diagnosis not present

## 2021-07-25 DIAGNOSIS — I509 Heart failure, unspecified: Secondary | ICD-10-CM | POA: Diagnosis not present

## 2021-07-25 DIAGNOSIS — N132 Hydronephrosis with renal and ureteral calculous obstruction: Secondary | ICD-10-CM | POA: Insufficient documentation

## 2021-07-25 HISTORY — PX: CYSTOSCOPY/URETEROSCOPY/HOLMIUM LASER/STENT PLACEMENT: SHX6546

## 2021-07-25 SURGERY — CYSTOSCOPY/URETEROSCOPY/HOLMIUM LASER/STENT PLACEMENT
Anesthesia: General | Laterality: Right

## 2021-07-25 MED ORDER — EPHEDRINE SULFATE 50 MG/ML IJ SOLN
INTRAMUSCULAR | Status: DC | PRN
  Administered 2021-07-25 (×3): 5 mg via INTRAVENOUS

## 2021-07-25 MED ORDER — KETOROLAC TROMETHAMINE 30 MG/ML IJ SOLN
INTRAMUSCULAR | Status: DC | PRN
  Administered 2021-07-25: 30 mg via INTRAVENOUS

## 2021-07-25 MED ORDER — PROPOFOL 10 MG/ML IV BOLUS
INTRAVENOUS | Status: DC | PRN
  Administered 2021-07-25: 180 mg via INTRAVENOUS

## 2021-07-25 MED ORDER — DEXAMETHASONE SODIUM PHOSPHATE 4 MG/ML IJ SOLN
INTRAMUSCULAR | Status: DC | PRN
  Administered 2021-07-25: 10 mg via INTRAVENOUS

## 2021-07-25 MED ORDER — OXYCODONE HCL 5 MG/5ML PO SOLN: 5.0000 mg | Freq: Once | ORAL | Status: DC | PRN

## 2021-07-25 MED ORDER — FENTANYL CITRATE PF 50 MCG/ML IJ SOSY: 25.0000 ug | PREFILLED_SYRINGE | INTRAMUSCULAR | Status: DC | PRN

## 2021-07-25 MED ORDER — LACTATED RINGERS IV SOLN: INTRAVENOUS | Status: DC

## 2021-07-25 MED ORDER — LIDOCAINE 2% (20 MG/ML) 5 ML SYRINGE
INTRAMUSCULAR | Status: DC | PRN
  Administered 2021-07-25: 80 mg via INTRAVENOUS

## 2021-07-25 MED ORDER — ONDANSETRON HCL 4 MG/2ML IJ SOLN: 4.0000 mg | Freq: Once | INTRAMUSCULAR | Status: DC | PRN

## 2021-07-25 MED ORDER — IOHEXOL 300 MG/ML  SOLN
INTRAMUSCULAR | Status: DC | PRN
  Administered 2021-07-25: 10 mL

## 2021-07-25 MED ORDER — ACETAMINOPHEN 10 MG/ML IV SOLN: 1000.0000 mg | Freq: Once | INTRAVENOUS | Status: DC | PRN

## 2021-07-25 MED ORDER — CHLORHEXIDINE GLUCONATE 0.12 % MT SOLN
15.0000 mL | Freq: Once | OROMUCOSAL | Status: AC
  Administered 2021-07-25: 15 mL via OROMUCOSAL

## 2021-07-25 MED ORDER — ORAL CARE MOUTH RINSE: 15.0000 mL | Freq: Once | OROMUCOSAL | Status: AC

## 2021-07-25 MED ORDER — KETOROLAC TROMETHAMINE 30 MG/ML IJ SOLN
INTRAMUSCULAR | Status: AC
  Filled 2021-07-25: qty 1

## 2021-07-25 MED ORDER — OXYCODONE HCL 5 MG PO TABS: 5.0000 mg | ORAL_TABLET | Freq: Once | ORAL | Status: DC | PRN

## 2021-07-25 MED ORDER — OXYCODONE-ACETAMINOPHEN 5-325 MG PO TABS: 1.0000 | ORAL_TABLET | ORAL | 0 refills | Status: DC | PRN

## 2021-07-25 MED ORDER — FENTANYL CITRATE (PF) 100 MCG/2ML IJ SOLN
INTRAMUSCULAR | Status: AC
  Filled 2021-07-25: qty 2

## 2021-07-25 MED ORDER — MIDAZOLAM HCL 5 MG/5ML IJ SOLN
INTRAMUSCULAR | Status: DC | PRN
  Administered 2021-07-25: 2 mg via INTRAVENOUS

## 2021-07-25 MED ORDER — SODIUM CHLORIDE 0.9 % IR SOLN
Status: DC | PRN
  Administered 2021-07-25: 3000 mL

## 2021-07-25 MED ORDER — CEFAZOLIN SODIUM-DEXTROSE 2-4 GM/100ML-% IV SOLN
2.0000 g | INTRAVENOUS | Status: AC
  Administered 2021-07-25: 2 g via INTRAVENOUS

## 2021-07-25 MED ORDER — FENTANYL CITRATE (PF) 100 MCG/2ML IJ SOLN
INTRAMUSCULAR | Status: DC | PRN
  Administered 2021-07-25 (×2): 25 ug via INTRAVENOUS
  Administered 2021-07-25: 50 ug via INTRAVENOUS

## 2021-07-25 MED ORDER — ONDANSETRON HCL 4 MG/2ML IJ SOLN
INTRAMUSCULAR | Status: DC | PRN
  Administered 2021-07-25: 4 mg via INTRAVENOUS

## 2021-07-25 MED ORDER — EPHEDRINE 5 MG/ML INJ
INTRAVENOUS | Status: AC
  Filled 2021-07-25: qty 5

## 2021-07-25 MED ORDER — CEFAZOLIN SODIUM-DEXTROSE 2-4 GM/100ML-% IV SOLN
INTRAVENOUS | Status: AC
  Filled 2021-07-25: qty 100

## 2021-07-25 MED ORDER — MIDAZOLAM HCL 2 MG/2ML IJ SOLN
INTRAMUSCULAR | Status: AC
  Filled 2021-07-25: qty 2

## 2021-07-25 SURGICAL SUPPLY — 22 items
BAG URO CATCHER STRL LF (MISCELLANEOUS) ×2 IMPLANT
BASKET ZERO TIP NITINOL 2.4FR (BASKET) IMPLANT
BSKT STON RTRVL ZERO TP 2.4FR (BASKET)
BULB IRRIG PATHFIND (MISCELLANEOUS) ×2 IMPLANT
CATH URETL OPEN 5X70 (CATHETERS) IMPLANT
CLOTH BEACON ORANGE TIMEOUT ST (SAFETY) ×2 IMPLANT
EXTRACTOR STONE NITINOL NGAGE (UROLOGICAL SUPPLIES) ×1 IMPLANT
GLOVE SURG ENC TEXT LTX SZ7.5 (GLOVE) ×2 IMPLANT
GOWN STRL REUS W/TWL XL LVL3 (GOWN DISPOSABLE) ×2 IMPLANT
GUIDEWIRE ANG ZIPWIRE 038X150 (WIRE) IMPLANT
GUIDEWIRE STR DUAL SENSOR (WIRE) ×2 IMPLANT
KIT TURNOVER KIT A (KITS) IMPLANT
LASER FIB FLEXIVA PULSE ID 365 (Laser) IMPLANT
MANIFOLD NEPTUNE II (INSTRUMENTS) ×2 IMPLANT
PACK CYSTO (CUSTOM PROCEDURE TRAY) ×2 IMPLANT
SHEATH NAVIGATOR HD 11/13X36 (SHEATH) ×1 IMPLANT
STENT URET 6FRX26 CONTOUR (STENTS) ×2 IMPLANT
SYR 20ML LL LF (SYRINGE) ×2 IMPLANT
TRACTIP FLEXIVA PULS ID 200XHI (Laser) IMPLANT
TRACTIP FLEXIVA PULSE ID 200 (Laser) ×2
TUBING CONNECTING 10 (TUBING) ×2 IMPLANT
TUBING UROLOGY SET (TUBING) ×2 IMPLANT

## 2021-07-25 NOTE — Anesthesia Procedure Notes (Signed)
Procedure Name: LMA Insertion Date/Time: 07/25/2021 10:57 AM Performed by: Mujtaba Bollig, Clinical cytogeneticist D, CRNA Pre-anesthesia Checklist: Patient identified, Emergency Drugs available, Suction available and Patient being monitored Patient Re-evaluated:Patient Re-evaluated prior to induction Oxygen Delivery Method: Circle system utilized Preoxygenation: Pre-oxygenation with 100% oxygen Induction Type: IV induction Ventilation: Mask ventilation without difficulty LMA: LMA inserted Laryngoscope Size: 4 Tube type: Oral Number of attempts: 1 Placement Confirmation: positive ETCO2 and breath sounds checked- equal and bilateral Tube secured with: Tape Dental Injury: Teeth and Oropharynx as per pre-operative assessment

## 2021-07-25 NOTE — Transfer of Care (Signed)
Immediate Anesthesia Transfer of Care Note  Patient: Ryan [name].  Procedure(s) Performed: Procedure(s) with comments: CYSTOSCOPY, URETEROSCOPY, PYELOSCOPY, HOLMIUM LASER, STENT EXCHANGE (Right) - 1 HR  Patient Location: PACU  Anesthesia Type:General  Level of Consciousness:  sedated, patient cooperative and responds to stimulation  Airway & Oxygen Therapy:Patient Spontanous Breathing and Patient connected to face mask oxgen  Post-op Assessment:  Report given to PACU RN and Post -op Vital signs reviewed and stable  Post vital signs:  Reviewed and stable  Last Vitals:  Vitals:   07/25/21 0844  BP: (!) 154/84  Pulse: 75  Resp: 16  Temp: (!) 36.2 C  SpO2: 794%    Complications: No apparent anesthesia complications

## 2021-07-25 NOTE — Op Note (Signed)
Preoperative diagnosis:  1.  1.  Right ureteral Steinstrasse status post ESL 2.  History of right ureteral injury during stent placement  Postoperative diagnosis: 1.  Same 2.  Same  Procedure(s): 1.  Cystoscopy, right retrograde pyelogram with intraoperative interpretation, right ureteroscopy and pyeloscopy with laser lithotripsy of right ureteral and renal calculi, right JJ stent exchange  Surgeon: Dr. Harold Barban  Anesthesia: General  Complications: None  EBL: Minimal  Specimens: Kidney stones  Disposition of specimens: With patient  Intraoperative findings: Patient had right ureteral Steinstrasse was able to irrigate fragments retrograde into the lower calyx of the kidney and the fragments were dusted into small passable size particles, a couple of fragments were able to be extracted but remainder were so small they could not be basket extracted.  This ureteral injury was well-healed and ureter was widely patent.  6 [place] by 26 cm Percuflex plus soft Contour stent was exchanged with tether at termination of the case  Indication: Patient is a 65 year old white male with history of right renal calculus.  Underwent ESL proximally 1 month ago and had ureteral Steinstrasse with significant colic which required urgent placement of right JJ stent approximately 3 weeks ago.  At time of stent placement ureteral injury was noted with extravasation of contrast.  Stent was eventually placed and he presents at this time to undergo cystoscopy and right ureteroscopy and attempted laser lithotripsy of remaining Steinstrasse and renal calculus.  Description of procedure:  After obtaining informed consent for the patient taken the major cystoscopy suite placed under general anesthesia.  Placed in dorsolithotomy position genitalia prepped draped in usual sterile fashion.  Proper pause timeout was performed for site of procedure.  40 Fransico was advanced in the bladder without difficulty.  Patient  was noted to have trilobar prostatic hypertrophy and right ureteral orifice was somewhat lateral and difficult to visualize due to the median lobe hypertrophy.  Right JJ stent was identified and alligator graspers were utilized to grasp the tip of the stent and pulled just beyond the urethral meatus.  A sensor wire was subsequently guided through the stent and manipulated up the ureter by the ureteral Steinstrasse in the renal pelvis without difficulty.  The JJ stent was removed leaving the guidewire in place.  A medium size 36 cm ureteral access catheter was placed under fluoroscopic vision..  First pass the obturator with minimal resistance over the guidewire and then placed the obturator and the access sheath along its length is reached up to the mid ureter.  This was distal to the Steinstrasse which was seen on fluoroscopy.  Subsequently passed the digital flexible scope through the access sheath was able to manipulate this to the distal end of the Steinstrasse.  Utilizing irrigation with Pathfinder the stone fragments dislodged and went retrograde into the renal pelvis and then into the lower calyx.  The previous ureteral injury site was well-healed and widely patent.  I was able to pass the scope through this quite easily.  Scope was passed up to the kidney and the fragments were identified in the lower calyx.  Utilizing a 200 m holmium laser fiber on the dust settings I dusted the fragments into the small particles.  I attempted basket extraction but they were too small to extract.  There were 2 fragments in another calyx which I was able to remove utilizing engage basket.  Retrograde pyelogram through the flexible scope revealed good integrity of the ureter and renal pelvis with no evidence of extravasation of  contrast.  The scope and access sheath were then backed down the ureter visualizing the ureter along its length leaving the guidewire in place.  The wire was then back fed through the cystoscope and a  6 [place] by 26 cm Percuflex plus soft Contour stent was placed leaving a proximal coil in the renal pelvis and distal coil in the bladder.  There is good flow of urine through and around the stent noted.  A nylon suture tether was left attached to the distal end of the stent left protruding just inside the urethral meatus to facilitate future removal after emptying the bladder.  Procedure was terminated he is awake from anesthesia and taken back to the recovery room in stable condition.  No immediate complication from the procedure.

## 2021-07-25 NOTE — Anesthesia Preprocedure Evaluation (Signed)
Anesthesia Evaluation  Patient identified by MRN, date of birth, ID band Patient awake    Reviewed: Allergy & Precautions, H&P , NPO status , Patient's Chart, lab work & pertinent test results  Airway Mallampati: II  TM Distance: >3 FB Neck ROM: Full    Dental no notable dental hx.    Pulmonary sleep apnea and Continuous Positive Airway Pressure Ventilation , Current Smoker and Patient abstained from smoking.,    Pulmonary exam normal breath sounds clear to auscultation       Cardiovascular hypertension, + CAD and +CHF  Normal cardiovascular exam Rhythm:Regular Rate:Normal     Neuro/Psych negative neurological ROS  negative psych ROS   GI/Hepatic GERD  ,Fatty liver disease   Endo/Other  negative endocrine ROS  Renal/GU negative Renal ROS  negative genitourinary   Musculoskeletal negative musculoskeletal ROS (+)   Abdominal   Peds negative pediatric ROS (+)  Hematology negative hematology ROS (+)   Anesthesia Other Findings   Reproductive/Obstetrics negative OB ROS                             Anesthesia Physical Anesthesia Plan  ASA: 3  Anesthesia Plan: General   Post-op Pain Management: Minimal or no pain anticipated   Induction: Intravenous  PONV Risk Score and Plan: 1 and Ondansetron and Treatment may vary due to age or medical condition  Airway Management Planned: LMA  Additional Equipment:   Intra-op Plan:   Post-operative Plan: Extubation in OR  Informed Consent: I have reviewed the patients History and Physical, chart, labs and discussed the procedure including the risks, benefits and alternatives for the proposed anesthesia with the patient or authorized representative who has indicated his/her understanding and acceptance.     Dental advisory given  Plan Discussed with: CRNA and Surgeon  Anesthesia Plan Comments:         Anesthesia Quick Evaluation

## 2021-07-26 ENCOUNTER — Encounter (HOSPITAL_COMMUNITY): Payer: Self-pay | Admitting: Urology

## 2021-07-26 NOTE — Anesthesia Postprocedure Evaluation (Signed)
Anesthesia Post Note  Patient: Ryan [name].  Procedure(s) Performed: CYSTOSCOPY, URETEROSCOPY, PYELOSCOPY, HOLMIUM LASER, STENT EXCHANGE (Right)     Patient location during evaluation: PACU Anesthesia Type: General Level of consciousness: awake and alert Pain management: pain level controlled Vital Signs Assessment: post-procedure vital signs reviewed and stable Respiratory status: spontaneous breathing, nonlabored ventilation, respiratory function stable and patient connected to nasal cannula oxygen Cardiovascular status: blood pressure returned to baseline and stable Postop Assessment: no apparent nausea or vomiting Anesthetic complications: no   No notable events documented.  Last Vitals:  Vitals:   07/25/21 1230 07/25/21 1245  BP: (!) 142/81 139/84  Pulse: 70 68  Resp: 12 16  Temp:  36.4 C  SpO2: 100% 100%    Last Pain:  Vitals:   07/25/21 1245  TempSrc:   PainSc: 0-No pain                 Osmel Dykstra S

## 2021-09-19 ENCOUNTER — Other Ambulatory Visit: Payer: Self-pay | Admitting: Internal Medicine

## 2021-09-19 NOTE — Telephone Encounter (Signed)
Please schedule overdue 6 month F/U appointment. Thank you! °

## 2021-09-20 ENCOUNTER — Encounter: Payer: Self-pay | Admitting: Physician Assistant

## 2021-09-20 ENCOUNTER — Other Ambulatory Visit: Payer: Self-pay

## 2021-09-20 ENCOUNTER — Ambulatory Visit (INDEPENDENT_AMBULATORY_CARE_PROVIDER_SITE_OTHER): Payer: 59 | Admitting: Physician Assistant

## 2021-09-20 VITALS — BP 138/80 | HR 65 | Ht 69.0 in | Wt 227.1 lb

## 2021-09-20 DIAGNOSIS — E785 Hyperlipidemia, unspecified: Secondary | ICD-10-CM

## 2021-09-20 DIAGNOSIS — I5032 Chronic diastolic (congestive) heart failure: Secondary | ICD-10-CM | POA: Diagnosis not present

## 2021-09-20 DIAGNOSIS — R002 Palpitations: Secondary | ICD-10-CM

## 2021-09-20 DIAGNOSIS — I2510 Atherosclerotic heart disease of native coronary artery without angina pectoris: Secondary | ICD-10-CM

## 2021-09-20 DIAGNOSIS — G4733 Obstructive sleep apnea (adult) (pediatric): Secondary | ICD-10-CM

## 2021-09-20 DIAGNOSIS — Z789 Other specified health status: Secondary | ICD-10-CM

## 2021-09-20 DIAGNOSIS — Z9989 Dependence on other enabling machines and devices: Secondary | ICD-10-CM

## 2021-09-20 DIAGNOSIS — I10 Essential (primary) hypertension: Secondary | ICD-10-CM

## 2021-09-20 MED ORDER — LOSARTAN POTASSIUM 25 MG PO TABS: 25.0000 mg | ORAL_TABLET | Freq: Every day | ORAL | 3 refills | Status: DC

## 2021-09-20 NOTE — Patient Instructions (Signed)
Medication Instructions:  No changes at this time.  *If you need a refill on your cardiac medications before your next appointment, please call your pharmacy*   Lab Work: [place] panel today  If you have labs (blood work) drawn today and your tests are completely normal, you will receive your results only by: [facility] (if you have MyChart) OR A paper copy in the mail If you have any lab test that is abnormal or we need to change your treatment, we will call you to review the results.   Testing/Procedures: None   Follow-Up: At [facility], you and your health needs are our priority.  As part of our continuing mission to provide you with exceptional heart care, we have created designated Provider Care Teams.  These Care Teams include your primary Cardiologist (physician) and Advanced Practice Providers (APPs -  Physician Assistants and Nurse Practitioners) who all work together to provide you with the care you need, when you need it.   Your next appointment:   6 month(s)  The format for your next appointment:   In Person  Provider:   Nelva Bush, MD or Christell Faith, PA-C

## 2021-09-20 NOTE — Progress Notes (Signed)
Cardiology Office Note    Date:  09/20/2021   ID:  Ryan Pandy., DOB 1956-04-22, MRN [id number]  PCP:  Debbrah Alar, NP  Cardiologist:  Nelva Bush, MD  Electrophysiologist:  None   Chief Complaint: Follow-up  History of Present Illness:   Ryan Granlund. is a 66 y.o. 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  Prior negative ischemic evaluation 2016.  Echo in early 2018 showed normal LV systolic function.  In early 2019, he was evaluated with exertional substernal chest pain and underwent diagnostic LHC which revealed nonobstructive CAD as outlined below.  Most recent echo from 10/2019 demonstrated an EF of 60 to 65%, no regional wall motion abnormalities, grade 2 diastolic dysfunction, normal RV systolic function and ventricular cavity size, mildly dilated left atrium, and mild mitral valve regurgitation.  He was last seen in our office in 01/2021 and was doing reasonably well from a cardiac perspective with mildly elevated blood pressure.  He had not needed any as needed furosemide.  He comes in doing well from a cardiac perspective.  No symptoms of angina or decompensation.  He remains very active at baseline without cardiac limitation.  He remains on aspirin and losartan without issues.  No falls, hematochezia, or melena.  No further issues with nephrolithiasis.  No dizziness, presyncope, or syncope.  He does report an isolated episode of generally not feeling well approximately 1 week ago.  He checked his blood pressure and noted it was elevated into the 180s.  He also reported his heart rate was into the 130s to 140s at that time.  Upon checking his heart rate  and blood pressure several hours later BP and heart rate were improved.  He has not had further episodes.  Blood pressure is typically well controlled at home.  His weight is down 8 pounds today when compared to his last clinic visit at our office.  Currently fasting.   Labs independently reviewed: 06/2021 - potassium 4.1, BUN 20, serum creatinine 1.36, albumin 4.4, AST/ALT normal, Hgb 14.8, PLT 224 04/2021 - magnesium 2.0 03/2021 - A1c 5.6 06/2020 - TSH normal 07/2019 - TC 143, TG 64, HDL 58, LDL 72  Past Medical History:  Diagnosis Date   Abnormal liver function    history of    Atypical chest pain    Blind left eye    blind left eye since birth   Cancer [facility])    skin:  basal cell carcinoma/ on face,back and neck   Carcinoid tumor of ileum s/p robotic colectomy 05/10/2021 05/10/2021   Cataract    Bil   CHF (congestive heart failure) ([place]) 4098   Diastolic dysfunction    J.08/9145 Echo: EF 55-60%, Gr1 DD, triv MR.   Fatty liver    fatty liver disease   History of indigestion    History of kidney stones    Hyperlipidemia    Hypertension    Non-obstructive CAD (coronary artery disease)    a. 2016 Neg MV; b. 10/2017 Cath: LM nl, LAD 92m, D1/2/3 nl, LCX 20p, RCA nl, EF 50-55%.   Sleep apnea    C-PAP   SOB (shortness of breath) 2019   after climbing stairs  Past Surgical History:  Procedure Laterality Date   COLONOSCOPY  01/2021   CYSTOSCOPY W/ URETERAL STENT PLACEMENT Right 07/04/2021   Procedure: CYSTOSCOPY WITH RETROGRADE PYELOGRAM/DIAGNOSTIC URETEROSCOPY/URETERAL STENT PLACEMENT;  Surgeon: Ardis Hughs, MD;  Location: [facility];  Service: Urology;  Laterality: Right;   CYSTOSCOPY/URETEROSCOPY/HOLMIUM LASER/STENT PLACEMENT Right 07/25/2021   Procedure: CYSTOSCOPY, URETEROSCOPY, PYELOSCOPY, HOLMIUM LASER, STENT EXCHANGE;  Surgeon: Remi Haggard, MD;  Location: [facility];  Service: Urology;  Laterality: Right;  1 HR   EXTRACORPOREAL SHOCK WAVE LITHOTRIPSY Right 06/26/2021    Procedure: EXTRACORPOREAL SHOCK WAVE LITHOTRIPSY (ESWL);  Surgeon: Lucas Mallow, MD;  Location: [facility];  Service: Urology;  Laterality: Right;   LEFT HEART CATH AND CORONARY ANGIOGRAPHY Left 10/29/2017   Procedure: LEFT HEART CATH AND CORONARY ANGIOGRAPHY;  Surgeon: Nelva Bush, MD;  Location: [facility];  Service: Cardiovascular;  Laterality: Left;   [place]   as a child    Current Medications: Current Meds  Medication Sig   aspirin EC 81 MG tablet Take 1 tablet (81 mg total) by mouth daily.   Omega-3 Fatty Acids (FISH OIL) 1000 MG CAPS Take 2,000 mg by mouth 2 (two) times daily.   [DISCONTINUED] losartan (COZAAR) 25 MG tablet Take 1 tablet (25 mg total) by mouth daily. PLEASE SCHEDULE OFFICE VISIT FOR FURTHER REFILLS. THANK YOU!    Allergies:   Atorvastatin and Tape   Social History   Socioeconomic History   Marital status: Married    Spouse name: Not on file   Number of children: 1   Years of education: Not on file   Highest education level: Not on file  Occupational History   Occupation: Designer, industrial/product: [employer]  Tobacco Use   Smoking status: Light Smoker    Types: Cigars   Smokeless tobacco: Never   Tobacco comments:    Smoked cigarettes and pipe on/off since 20's, now occasional cigar once per month  Vaping Use   Vaping Use: Never used  Substance and Sexual Activity   Alcohol use: Yes    Alcohol/week: 2.0 standard drinks    Types: 1 Cans of beer, 1 Standard drinks or equivalent per week    Comment: occasional drink beer   Drug use: No   Sexual activity: Not on file  Other Topics Concern   Not on file  Social History Narrative   Retired Engineer, building services    Social Determinants of Health   Financial Resource Strain: Not on file  Food Insecurity: Not on file  Transportation Needs: Not on file  Physical Activity: Not on file  Stress: Not on file  Social  Connections: Not on file     Family History:  The patient's family history includes Cancer in his father and mother; Colon cancer in his mother; Diabetes in his mother and sister; Heart attack in his father; Heart disease in his father; Hypertension in his mother; Lung cancer in his father; Sinusitis in his sister. There is no history of Colon polyps, Esophageal cancer, Stomach cancer, or Rectal cancer.  ROS:   Review of Systems  Constitutional:  Positive for malaise/fatigue. Negative for chills, diaphoresis, fever and weight loss.  HENT:  Negative for congestion.   Eyes:  Negative for discharge and redness.  Respiratory:  Negative for cough, sputum production, shortness of breath and wheezing.   Cardiovascular:  Positive for palpitations. Negative for chest pain, orthopnea, claudication,  leg swelling and PND.  Gastrointestinal:  Negative for abdominal pain, blood in stool, heartburn, melena, nausea and vomiting.  Musculoskeletal:  Negative for falls and myalgias.  Skin:  Negative for rash.  Neurological:  Negative for dizziness, tingling, tremors, sensory change, speech change, focal weakness, loss of consciousness and weakness.  Endo/Heme/Allergies:  Does not bruise/bleed easily.  Psychiatric/Behavioral:  Negative for substance abuse. The patient is not nervous/anxious.   All other systems reviewed and are negative.   EKGs/Labs/Other Studies Reviewed:    Studies reviewed were summarized above. The additional studies were reviewed today:  2D echo 11/05/2019: 1. Left ventricular ejection fraction, by estimation, is 60 to 65%. The  left ventricle has normal function. The left ventricle has no regional  wall motion abnormalities. Left ventricular diastolic parameters are  consistent with Grade II diastolic  dysfunction (pseudonormalization).   2. Right ventricular systolic function is normal. The right ventricular  size is normal. Tricuspid regurgitation signal is inadequate for  assessing  PA pressure.   3. Left atrial size was mildly dilated.   4. Mild mitral valve regurgitation.  __________  LHC 10/29/2017: Conclusions: Mild to moderate coronary artery disease, with long 50% stenosis of the mid/distal LAD and 20% ostial LCx disease. Low normal left ventricular contraction (LVEF 50-55%). Upper normal left ventricular filling pressure (LVEDP 15 mmHg). __________  2D echo 10/17/2016: - Left ventricle: The cavity size was normal. Wall thickness was    increased in a pattern of mild LVH. Systolic function was normal.    The estimated ejection fraction was in the range of 55% to 60%.    Doppler parameters are consistent with abnormal left ventricular    relaxation (grade 1 diastolic dysfunction).  - Mitral valve: Mildly thickened leaflets . There was trivial    regurgitation.  - Right ventricle: The cavity size was normal. Systolic function    was normal.   EKG:  EKG is ordered today.  The EKG ordered today demonstrates NSR, 65 bpm, no acute ST-T changes  Recent Labs: 05/11/2021: Magnesium 2.0 06/28/2021: ALT 34; BUN 20; Creatinine, Ser 1.36; Hemoglobin 14.8; Platelets 224; Potassium 4.1; Sodium 136  Recent Lipid Panel    Component Value Date/Time   CHOL 143 08/26/2019 0854   TRIG 64.0 08/26/2019 0854   HDL 58.10 08/26/2019 0854   CHOLHDL 2 08/26/2019 0854   VLDL 12.8 08/26/2019 0854   LDLCALC 72 08/26/2019 0854   LDLCALC 69 02/07/2018 1658   LDLDIRECT 144.3 11/17/2007 1623    PHYSICAL EXAM:    VS:  BP 138/80 (BP Location: Left Arm, Patient Position: Sitting, Cuff Size: Normal)    Pulse 65    Ht 5\' 9"  (1.753 m)    Wt 227 lb 2 oz (103 kg)    SpO2 98%    BMI 33.54 kg/m   BMI: Body mass index is 33.54 kg/m.  Physical Exam Vitals reviewed.  Constitutional:      Appearance: He is well-developed.  HENT:     Head: Normocephalic and atraumatic.  Eyes:     General:        Right eye: No discharge.        Left eye: No discharge.  Neck:     Vascular:  No JVD.  Cardiovascular:     Rate and Rhythm: Normal rate and regular rhythm.     Pulses:          Posterior tibial pulses are 2+ on the right side and 2+ on the left side.  Heart sounds: Normal heart sounds, S1 normal and S2 normal. Heart sounds not distant. No midsystolic click and no opening snap. No murmur heard.   No friction rub.  Pulmonary:     Effort: Pulmonary effort is normal. No respiratory distress.     Breath sounds: Normal breath sounds. No decreased breath sounds, wheezing or rales.  Chest:     Chest wall: No tenderness.  Abdominal:     General: There is no distension.     Palpations: Abdomen is soft.     Tenderness: There is no abdominal tenderness.  Musculoskeletal:     Cervical back: Normal range of motion.     Right lower leg: No edema.     Left lower leg: No edema.  Skin:    General: Skin is warm and dry.     Nails: There is no clubbing.  Neurological:     Mental Status: He is alert and oriented to person, place, and time.  Psychiatric:        Speech: Speech normal.        Behavior: Behavior normal.        Thought Content: Thought content normal.        Judgment: Judgment normal.    Wt Readings from Last 3 Encounters:  09/20/21 227 lb 2 oz (103 kg)  07/25/21 223 lb 1.7 oz (101.2 kg)  07/14/21 223 lb (101.2 kg)     ASSESSMENT & PLAN:   Nonobstructive CAD: He is doing well without any symptoms concerning for angina.  Continue primary prevention and risk factor modification.  He remains on aspirin and losartan.  Not currently on a statin secondary to prior noted intolerance.  Update a CMP and lipid panel as outlined below.  HFpEF: He appears euvolemic and well compensated.  Not requiring a standing or as needed diuretic.  His weight is down 8 pounds today when compared to his last in-person clinic visit at our office.  HTN: Blood pressure is mildly elevated in the office today though typically well controlled.  Continue losartan 25 mg daily.  HLD: LDL  72 in 07/2019.  Goal LDL less than 70.  Not currently on statin therapy.  Has noted intolerance to atorvastatin secondary to swelling of the hands.  Palpitations: He reports an isolated episode of palpitations with elevated BP approximately 1 week ago.  Symptoms spontaneously resolved.  He has been asymptomatic since.  Given this was an isolated episode and he has been asymptomatic since we will defer outpatient cardiac monitoring at this time.  He will let us know if symptoms recur.  OSA: Continued adherence with CPAP recommended.    Disposition: F/u with Dr. Saunders Revel or an APP in 6 months, sooner if needed.   Medication Adjustments/Labs and Tests Ordered: Current medicines are reviewed at length with the patient today.  Concerns regarding medicines are outlined above. Medication changes, Labs and Tests ordered today are summarized above and listed in the Patient Instructions accessible in Encounters.   Signed, Christell Faith, PA-C 09/20/2021 10:11 AM     [facility] [address] [place] [address] [place], [place] [address] [phone]

## 2021-09-21 ENCOUNTER — Other Ambulatory Visit: Payer: Self-pay | Admitting: *Deleted

## 2021-09-21 DIAGNOSIS — E785 Hyperlipidemia, unspecified: Secondary | ICD-10-CM

## 2021-09-21 DIAGNOSIS — Z789 Other specified health status: Secondary | ICD-10-CM

## 2021-09-21 DIAGNOSIS — I2510 Atherosclerotic heart disease of native coronary artery without angina pectoris: Secondary | ICD-10-CM

## 2021-09-21 LAB — COMPREHENSIVE METABOLIC PANEL
ALT: 19 IU/L (ref 0–44)
AST: 19 IU/L (ref 0–40)
Albumin/Globulin Ratio: 1.8 (ref 1.2–2.2)
Albumin: 4.6 g/dL (ref 3.8–4.8)
Alkaline Phosphatase: 94 IU/L (ref 44–121)
BUN/Creatinine Ratio: 14 (ref 10–24)
BUN: 16 mg/dL (ref 8–27)
Bilirubin Total: 0.6 mg/dL (ref 0.0–1.2)
CO2: 26 mmol/L (ref 20–29)
Calcium: 9.6 mg/dL (ref 8.6–10.2)
Chloride: 104 mmol/L (ref 96–106)
Creatinine, Ser: 1.18 mg/dL (ref 0.76–1.27)
Globulin, Total: 2.5 g/dL (ref 1.5–4.5)
Glucose: 98 mg/dL (ref 70–99)
Potassium: 4.4 mmol/L (ref 3.5–5.2)
Sodium: 141 mmol/L (ref 134–144)
Total Protein: 7.1 g/dL (ref 6.0–8.5)
eGFR: 69 mL/min/{1.73_m2} (ref >59)

## 2021-09-21 LAB — LIPID PANEL
Chol/HDL Ratio: 4.9 ratio (ref 0.0–5.0)
Cholesterol, Total: 232 mg/dL — ABNORMAL HIGH (ref 100–199)
HDL: 47 mg/dL (ref >39)
LDL Chol Calc (NIH): 166 mg/dL — ABNORMAL HIGH (ref 0–99)
Triglycerides: 105 mg/dL (ref 0–149)
VLDL Cholesterol Cal: 19 mg/dL (ref 5–40)

## 2021-10-06 ENCOUNTER — Other Ambulatory Visit: Payer: Self-pay

## 2021-10-06 DIAGNOSIS — D3A012 Benign carcinoid tumor of the ileum: Secondary | ICD-10-CM

## 2021-10-09 ENCOUNTER — Encounter: Payer: Self-pay | Admitting: Hematology

## 2021-10-09 ENCOUNTER — Inpatient Hospital Stay: Payer: 59 | Attending: Nurse Practitioner | Admitting: Hematology

## 2021-10-09 ENCOUNTER — Inpatient Hospital Stay: Payer: 59

## 2021-10-09 ENCOUNTER — Other Ambulatory Visit: Payer: Self-pay

## 2021-10-09 VITALS — BP 135/81 | HR 67 | Temp 98.5°F | Resp 18 | Ht 69.0 in | Wt 227.2 lb

## 2021-10-09 DIAGNOSIS — C7A012 Malignant carcinoid tumor of the ileum: Secondary | ICD-10-CM | POA: Diagnosis not present

## 2021-10-09 DIAGNOSIS — C7B8 Other secondary neuroendocrine tumors: Secondary | ICD-10-CM | POA: Insufficient documentation

## 2021-10-09 DIAGNOSIS — C7A8 Other malignant neuroendocrine tumors: Secondary | ICD-10-CM | POA: Diagnosis not present

## 2021-10-09 DIAGNOSIS — D3A012 Benign carcinoid tumor of the ileum: Secondary | ICD-10-CM

## 2021-10-09 LAB — COMPREHENSIVE METABOLIC PANEL
ALT: 18 U/L (ref 0–44)
AST: 21 U/L (ref 15–41)
Albumin: 4.4 g/dL (ref 3.5–5.0)
Alkaline Phosphatase: 77 U/L (ref 38–126)
Anion gap: 6 (ref 5–15)
BUN: 18 mg/dL (ref 8–23)
CO2: 28 mmol/L (ref 22–32)
Calcium: 9.6 mg/dL (ref 8.9–10.3)
Chloride: 104 mmol/L (ref 98–111)
Creatinine, Ser: 1.16 mg/dL (ref 0.61–1.24)
GFR, Estimated: >60 mL/min (ref >60)
Glucose, Bld: 99 mg/dL (ref 70–99)
Potassium: 4.8 mmol/L (ref 3.5–5.1)
Sodium: 138 mmol/L (ref 135–145)
Total Bilirubin: 0.6 mg/dL (ref 0.3–1.2)
Total Protein: 7.4 g/dL (ref 6.5–8.1)

## 2021-10-09 LAB — CBC WITH DIFFERENTIAL/PLATELET
Abs Immature Granulocytes: 0.01 10*3/uL (ref 0.00–0.07)
Basophils Absolute: 0 10*3/uL (ref 0.0–0.1)
Basophils Relative: 1 %
Eosinophils Absolute: 0.2 10*3/uL (ref 0.0–0.5)
Eosinophils Relative: 3 %
HCT: 40.6 % (ref 39.0–52.0)
Hemoglobin: 14.2 g/dL (ref 13.0–17.0)
Immature Granulocytes: 0 %
Lymphocytes Relative: 25 %
Lymphs Abs: 1.6 10*3/uL (ref 0.7–4.0)
MCH: 29.8 pg (ref 26.0–34.0)
MCHC: 35 g/dL (ref 30.0–36.0)
MCV: 85.1 fL (ref 80.0–100.0)
Monocytes Absolute: 0.4 10*3/uL (ref 0.1–1.0)
Monocytes Relative: 6 %
Neutro Abs: 4.1 10*3/uL (ref 1.7–7.7)
Neutrophils Relative %: 65 %
Platelets: 199 10*3/uL (ref 150–400)
RBC: 4.77 MIL/uL (ref 4.22–5.81)
RDW: 12.3 % (ref 11.5–15.5)
WBC: 6.3 10*3/uL (ref 4.0–10.5)
nRBC: 0 % (ref 0.0–0.2)

## 2021-10-09 NOTE — Progress Notes (Signed)
Ryan [name]   [phone] Fax:([phone]) [phone]   Clinic Follow up Note   Patient Care Team: Debbrah Alar, NP as PCP - General (Internal Medicine) End, Harrell Gave, MD as PCP - Cardiology (Cardiology) Deneise Lever, MD as Consulting Physician (Pulmonary Disease) [name], MD as Consulting Physician (General Surgery) Irene Shipper, MD as Consulting Physician (Gastroenterology) Remi Haggard, MD as Consulting Physician (Urology) Alla Feeling, NP as Nurse Practitioner (Medical Oncology)  Date of Service:  10/09/2021  CHIEF COMPLAINT: f/u of neuroendocrine tumor of ileum  CURRENT THERAPY:  Surveillance  ASSESSMENT & PLAN:  Ryan Shaff. is a 66 y.o. male with   1. Neuroendocrine tumor of the terminal ileum, grade 1, pT2N1, stage III, Ki-67 < 1% -initially presented with hematuria. CT AP 12/02/20 showed incidental 1.9 cm ileum/small bowel mass. CT chest 01/02/21 showed a small indeterminate right lung nodule, otherwise negative for metastatic disease. Pathology from colonoscopy on 02/15/21 under Dr. Henrene Pastor showed low grade neuroendocrine tumor.   -He underwent partial colectomy by Dr. Johney Maine on 05/10/2021, path showed well differentiated 2.2 cm NET in the terminal ileum invading the muscularis propria, clear margins, 2/12 positive lymph nodes, and < 2 mitoses per 2 millimeter squared.  This was staged pT2pN1 -Baseline chromogranin A and urine 5-HIAA 06/22/21 are normal -DOTATATE PET on 06/22/21 was NED, with favored physiologic activity in pancreas and stable RLL pulmonary nodule. -he is clinically do well, no new or recurrent symptoms. Labs reviewed, all WNL. We will continue to monitor his chromogranin A. -next surveillance CT in 8 months    2. Hematuria and kidney stones -f/up with urology Dr. Milford Cage at [facility] -Denies recent hematuria -S/p lithotripsy 06/26/21 by Dr. Gloriann Loan, ureteral stent placement 07/04/21 by Dr.  Louis Meckel, and stent exchange 07/25/21 by Dr. Milford Cage   3. HTN, GERD, OSA -Per PCP -He knows to avoid certain foods and meds including PPI, aspirin 2 days prior to tumor marker testing    Plan: -lab and f/u with NP Lacie in 4 months   No problem-specific Assessment & Plan notes found for this encounter.   SUMMARY OF ONCOLOGIC HISTORY: -11/2020 initial presentation with painless hematuria   -12/02/20 CT AP w/wo contrast: kidney stones and incidental finding of nodular enhancement in the terminal ileum measuring 1.9 x 1.5 cm suspicious for small tumor and none mass enhancement just proximal to the area concerning for ileal inflammation, there is no evidence of obstruction.     -01/02/21 CT chest wo contrast: 6 mm nodule in the RIGHT middle lobe unchanged in short interval (image 86/3). Calcified RIGHT middle lobe nodule measures 4 mm on image 86.   -02/15/2021 diagnostic colonoscopy by Dr. Scarlette Shorts findings - The terminal ileum contained a submucosal partially obstructing mass. In addition, its diameter measured 20 mm. No bleeding was present. This was biopsied with a cold forceps for histology. The lesion was located about 2 or 3 cm proximal to the ileocecal valve. The endoscope was passed proximal to the lesion with normal-appearing ileal mucosa. Initial path Diagnosis Surgical [P], ileal nodule biopsies - WELL-DIFFERENTIATED NEUROENDOCRINE TUMOR, GRADE 1 Microscopic Comment By immunohistochemistry, the neoplastic cells are positive for chromogranin synaptic ficin and weakly positive for CD56. The proliferative rate by Ki-67 is low (<1%).   -05/10/2021 PROCEDURE:  ROBOTIC PROXIMAL COLECTOMY, SURGEON:  Adin Hector, MD FINAL MICROSCOPIC DIAGNOSIS:  A. COLON, PROXIMAL RIGHT, RESECTION:  - Well-differentiated neuroendocrine tumor involving ileum, 2.2 cm in maximal extent, with  extension into the underlying muscularis.  - 2 of 12 lymph nodes positive for metastatic disease.  - Tumor nodule,  1.  - Unremarkable appendix.  No tumor identified.    INTERVAL HISTORY:  Ryan [name]. is here for a follow up of NET. He was last seen by me on 06/07/21 in consultation with NP Lacie with phone visit in the interim. He presents to the clinic alone. He reports he continues to do well. He denies any new concerns or symptoms.   All other systems were reviewed with the patient and are negative.  MEDICAL HISTORY:  Past Medical History:  Diagnosis Date   Abnormal liver function    history of    Atypical chest pain    Blind left eye    blind left eye since birth   Cancer [facility])    skin:  basal cell carcinoma/ on face,back and neck   Carcinoid tumor of ileum s/p robotic colectomy 05/10/2021 05/10/2021   Cataract    Bil   CHF (congestive heart failure) ([place]) 6433   Diastolic dysfunction    I.04/5187 Echo: EF 55-60%, Gr1 DD, triv MR.   Fatty liver    fatty liver disease   History of indigestion    History of kidney stones    Hyperlipidemia    Hypertension    Non-obstructive CAD (coronary artery disease)    a. 2016 Neg MV; b. 10/2017 Cath: LM nl, LAD 37m, D1/2/3 nl, LCX 20p, RCA nl, EF 50-55%.   Sleep apnea    C-PAP   SOB (shortness of breath) 2019   after climbing stairs    SURGICAL HISTORY: Past Surgical History:  Procedure Laterality Date   COLONOSCOPY  01/2021   CYSTOSCOPY W/ URETERAL STENT PLACEMENT Right 07/04/2021   Procedure: CYSTOSCOPY WITH RETROGRADE PYELOGRAM/DIAGNOSTIC URETEROSCOPY/URETERAL STENT PLACEMENT;  Surgeon: Ardis Hughs, MD;  [facility];  Service: Urology;  Laterality: Right;   CYSTOSCOPY/URETEROSCOPY/HOLMIUM LASER/STENT PLACEMENT Right 07/25/2021   Procedure: CYSTOSCOPY, URETEROSCOPY, PYELOSCOPY, HOLMIUM LASER, STENT EXCHANGE;  Surgeon: Remi Haggard, MD;  [facility];  Service: Urology;  Laterality: Right;  1 HR   EXTRACORPOREAL SHOCK WAVE LITHOTRIPSY Right 06/26/2021   Procedure: EXTRACORPOREAL SHOCK WAVE LITHOTRIPSY (ESWL);   Surgeon: Lucas Mallow, MD;  Location: [facility];  Service: Urology;  Laterality: Right;   LEFT HEART CATH AND CORONARY ANGIOGRAPHY Left 10/29/2017   Procedure: LEFT HEART CATH AND CORONARY ANGIOGRAPHY;  Surgeon: Nelva Bush, MD;  Location: [facility];  Service: Cardiovascular;  Laterality: Left;   [place]   as a child    I have reviewed the social history and family history with the patient and they are unchanged from previous note.  ALLERGIES:  is allergic to atorvastatin and tape.  MEDICATIONS:  Current Outpatient Medications  Medication Sig Dispense Refill   aspirin EC 81 MG tablet Take 1 tablet (81 mg total) by mouth daily. 90 tablet 3   losartan (COZAAR) 25 MG tablet Take 1 tablet (25 mg total) by mouth daily. 90 tablet 3   Omega-3 Fatty Acids (FISH OIL) 1000 MG CAPS Take 2,000 mg by mouth 2 (two) times daily.     No current facility-administered medications for this visit.    PHYSICAL EXAMINATION: ECOG PERFORMANCE STATUS: 0 - Asymptomatic  Vitals:   10/09/21 1332  BP: 135/81  Pulse: 67  Resp: 18  Temp: 98.5 F (36.9 C)  SpO2: 100%  Wt Readings from Last 3 Encounters:  10/09/21 227 lb 3.2 oz (103.1 kg)  09/20/21 227 lb 2 oz (103 kg)  07/25/21 223 lb 1.7 oz (101.2 kg)     GENERAL:alert, no distress and comfortable SKIN: skin color, texture, turgor are normal, no rashes or significant lesions EYES: normal, Conjunctiva are pink and non-injected, sclera clear  NECK: supple, thyroid normal size, non-tender, without nodularity LYMPH:  no palpable lymphadenopathy in the cervical, axillary  LUNGS: clear to auscultation and percussion with normal breathing effort HEART: regular rate & rhythm and no murmurs and no lower extremity edema ABDOMEN:abdomen soft, non-tender and normal bowel sounds Musculoskeletal:no cyanosis of digits and no clubbing  NEURO: alert & oriented x 3 with fluent speech, no  focal motor/sensory deficits  LABORATORY DATA:  I have reviewed the data as listed CBC Latest Ref Rng & Units 10/09/2021 06/28/2021 06/22/2021  WBC 4.0 - 10.5 K/uL 6.3 12.5(H) 4.7  Hemoglobin 13.0 - 17.0 g/dL 14.2 14.8 14.2  Hematocrit 39.0 - 52.0 % 40.6 42.0 41.5  Platelets 150 - 400 K/uL 199 224 196     CMP Latest Ref Rng & Units 10/09/2021 09/20/2021 06/28/2021  Glucose 70 - 99 mg/dL 99 98 129(H)  BUN 8 - 23 mg/dL $Remove'18 16 20  'ALvSLzx$ Creatinine 0.61 - 1.24 mg/dL 1.16 1.18 1.36(H)  Sodium 135 - 145 mmol/L 138 141 136  Potassium 3.5 - 5.1 mmol/L 4.8 4.4 4.1  Chloride 98 - 111 mmol/L 104 104 103  CO2 22 - 32 mmol/L $RemoveB'28 26 26  'cznAJETk$ Calcium 8.9 - 10.3 mg/dL 9.6 9.6 9.8  Total Protein 6.5 - 8.1 g/dL 7.4 7.1 7.9  Total Bilirubin 0.3 - 1.2 mg/dL 0.6 0.6 0.7  Alkaline Phos 38 - 126 U/L 77 94 76  AST 15 - 41 U/L 21 19 33  ALT 0 - 44 U/L 18 19 34      RADIOGRAPHIC STUDIES: I have personally reviewed the radiological images as listed and agreed with the findings in the report. No results found.    Orders Placed This Encounter  Procedures   Chromogranin A    Standing Status:   Standing    Number of Occurrences:   20    Standing Expiration Date:   10/09/2022   All questions were answered. The patient knows to call the clinic with any problems, questions or concerns. No barriers to learning was detected. The total time spent in the appointment was 20 minutes.     Truitt Merle, MD 10/09/2021   I, Wilburn Mylar, am acting as scribe for Truitt Merle, MD.   I have reviewed the above documentation for accuracy and completeness, and I agree with the above.

## 2021-10-12 ENCOUNTER — Telehealth: Payer: Self-pay | Admitting: Physician Assistant

## 2021-10-12 NOTE — Telephone Encounter (Signed)
Attempted to schedule fasting labs no ans no vm

## 2021-10-13 ENCOUNTER — Ambulatory Visit: Payer: 59

## 2021-10-20 ENCOUNTER — Ambulatory Visit: Payer: 59

## 2021-11-27 ENCOUNTER — Ambulatory Visit (INDEPENDENT_AMBULATORY_CARE_PROVIDER_SITE_OTHER)
Admission: RE | Admit: 2021-11-27 | Discharge: 2021-11-27 | Disposition: A | Discharge status: Home / Self Care | Payer: 59 | Source: Ambulatory Visit | Attending: Internal Medicine | Admitting: Internal Medicine

## 2021-11-27 DIAGNOSIS — R911 Solitary pulmonary nodule: Secondary | ICD-10-CM

## 2021-11-28 NOTE — Progress Notes (Signed)
Called pt and there was no answer-LMTCB °

## 2021-12-02 NOTE — Progress Notes (Signed)
HPI ?M cigar smoker followed for OSA, complicated by Blind L eye (congenital), CAD/ angina, Diastolic Dysfunction, HTN, Fatty Liver, GERD, Hyperlipidemia, Raynaud's, Tobacco use, Diastolic Dysfunction/ edema, ?HST- 09/23/19- AHI  54.2/ hr, desaturation to 64%, body weight 232 lbs ? ? ?=============================================== ? ? ?12/22/20- 64 yoM cigar smoker followed for OSA, Lung Nodule, complicated by Blind L eye (congenital), CAD/ angina, Diastolic Dysfunction, HTN, Fatty Liver, GERD, Hyperlipidemia, Raynaud's, Tobacco use, Diastolic Dysfunction/ edema, ?CPAP auto 10-20/ [employer]    Ordered 09/30/19 ?Body weight today- ?Download- compliance 97%, AHI 3.5/ hr ?-----Follow up after found spot on lung on CT. Had CT at [facility]- abdomen and pelvis 12/01/20. Findings include 6x5 mm RLL nodule ?Covid vax-3 Phizer ?Flu vax-had ?Kidney stone- will require lithotripsy. Incidental finding of small bowel mass with colonoscopy being scheduled. Incidental finding of 6x5 mmRLL nodule in visualized lung. ?He smoked 1-2 ppd cigs for a couple of years in his 60's. 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 Had PPD placed (result?) in grade school after classmate had exposure.No routine cough/ phlegm.  ? ?12/04/21- 64 yoM cigar smoker followed for OSA, Lung Nodule, Asbestos Exposure,  complicated by Blind L eye (congenital), CAD/ angina, Diastolic Dysfunction, HTN, Fatty Liver, GERD, Hyperlipidemia, Raynaud's, Tobacco use, Diastolic Dysfunction/ edema, Kidney Stone, Small Bowel Malignant Carcinoid/ Partial Colectomy, ,  ?CPAP auto 10-20/ [employer]    Ordered 09/30/19 ?Body weight today-222 lbs ?Download- compliance 100%, AHI 3.1/ hr ?Covid vax-3 Phizer ?Flu - no ?Download reviewed. Comfortable with CPAP. ?Lung nodule benign by CT. ?ASCVD/CAD on CT. 1 episode palpitation. Has cardiologist. ?CT chest 11/27/21-  ?IMPRESSION: ?1. Slight decreased size of right lower lobe pulmonary nodule, ?considered definitively  benign requiring no future imaging ?follow-up. ?2. Aortic atherosclerosis, in addition to left main and three-vessel ?coronary artery disease. Please note that although the presence of ?coronary artery calcium documents the presence of coronary artery ?disease, the severity of this disease and any potential stenosis ?cannot be assessed on this non-gated CT examination. Assessment for ?potential risk factor modification, dietary therapy or pharmacologic ?therapy may be warranted, if clinically indicated. ?Aortic Atherosclerosis (ICD10-I70.0). ? ? ?ROS-see HPI   + = positive ?Constitutional:    weight loss, night sweats, fevers, chills, +fatigue, lassitude. ?HEENT:    headaches, difficulty swallowing, tooth/dental problems, sore throat,  ?     sneezing, itching, ear ache, nasal congestion, post nasal drip, snoring ?CV:    chest pain, orthopnea, PND, +swelling in lower extremities, anasarca,                                  ? dizziness,+ palpitations ?Resp:   shortness of breath with exertion or at rest.   ?             productive cough,   non-productive cough, coughing up of blood.   ?           change in color of mucus.  wheezing.   ?Skin:    rash or lesions. ?GI:  No-   heartburn, indigestion, abdominal pain, nausea, vomiting, diarrhea,  ?               change in bowel habits, loss of appetite ?GU: dysuria, change in color of urine, no urgency or frequency.   flank pain. ?MS:   joint pain, stiffness, decreased range of motion, back pain. ?Neuro-     nothing unusual ?Psych:  change in  mood or affect.  depression or anxiety.   memory loss. ? ?OBJ- Physical Exam ?General- Alert, Oriented, Affect-appropriate, Distress- none acute ?Skin- rash-none, lesions- none, excoriation- none ?Lymphadenopathy- none ?Head- atraumatic ?           Eyes- + L strabismus, +blind L eye,  ?           Ears- Hearing, canals-normal ?           Nose- Clear, no-Septal dev, mucus, polyps, erosion, perforation  ?           Throat- Mallampati IV ,  mucosa clear , drainage- none, tonsils- atrophic ?Neck- flexible , trachea midline, no stridor , thyroid nl, carotid no bruit ?Chest - symmetrical excursion , unlabored ?          Heart/CV- RRR , no murmur , no gallop  , no rub, nl s1 s2 ?                          - JVD- none , edema +1, stasis changes- none, varices- none ?          Lung- clear to P&A, wheeze- none, cough- none , dullness-none, rub- none ?          Chest wall-  ?Abd-  ?Br/ Gen/ Rectal- Not done, not indicated ?Extrem- cyanosis- none, clubbing, none, atrophy- none, strength- nl ?Neuro- grossly intact to observation ? ? ?

## 2021-12-03 ENCOUNTER — Encounter: Payer: Self-pay | Admitting: Internal Medicine

## 2021-12-04 ENCOUNTER — Ambulatory Visit (INDEPENDENT_AMBULATORY_CARE_PROVIDER_SITE_OTHER): Payer: 59 | Admitting: Internal Medicine

## 2021-12-04 ENCOUNTER — Encounter: Payer: Self-pay | Admitting: Internal Medicine

## 2021-12-04 DIAGNOSIS — Z9989 Dependence on other enabling machines and devices: Secondary | ICD-10-CM

## 2021-12-04 DIAGNOSIS — I2510 Atherosclerotic heart disease of native coronary artery without angina pectoris: Secondary | ICD-10-CM | POA: Diagnosis not present

## 2021-12-04 DIAGNOSIS — G4733 Obstructive sleep apnea (adult) (pediatric): Secondary | ICD-10-CM

## 2021-12-04 NOTE — Patient Instructions (Signed)
We can continue CPAP auto 10-20 ? ?Keep up with your cardiologist and your oncologist as planned ? ?Please call if we can help ? ? ?

## 2022-01-01 ENCOUNTER — Encounter: Payer: Self-pay | Admitting: Internal Medicine

## 2022-01-01 NOTE — Assessment & Plan Note (Signed)
Benefits from CPAP with good compliance and control Plan- continue auto 10-20 

## 2022-01-01 NOTE — Assessment & Plan Note (Signed)
Known calcification. He is followed by cardiology ?

## 2022-01-13 IMAGING — DX DG LUMBAR SPINE COMPLETE 4+V
5 series · 5 of 5 positions shown · non-contrast
Comparison: MRI lumbar spine dated July 18, 2013.

CLINICAL DATA: Low back pain radiating into the right leg.

EXAM:
LUMBAR SPINE - COMPLETE 4+ VIEW

[l-spine ap]
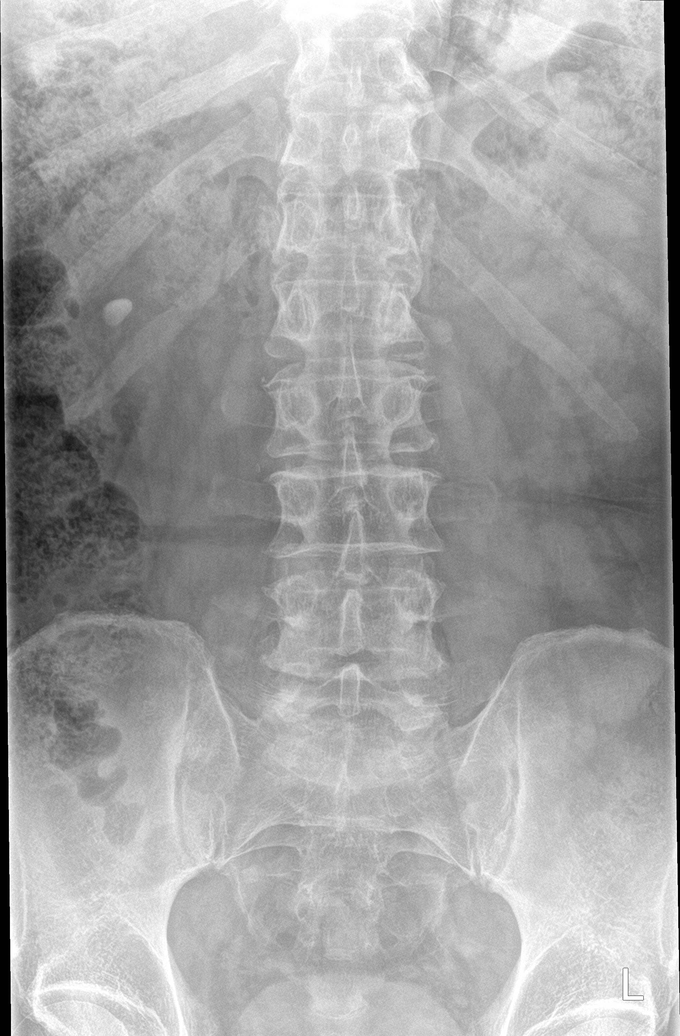

[l-spine obl (1 of 2)]
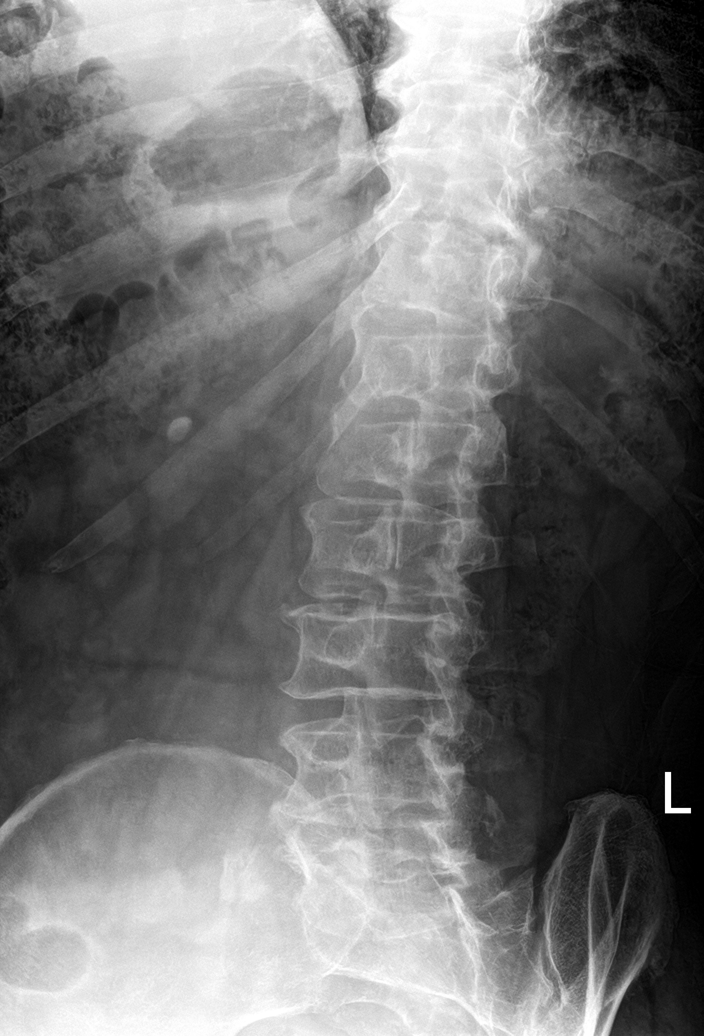

[l-spine obl (2 of 2)]
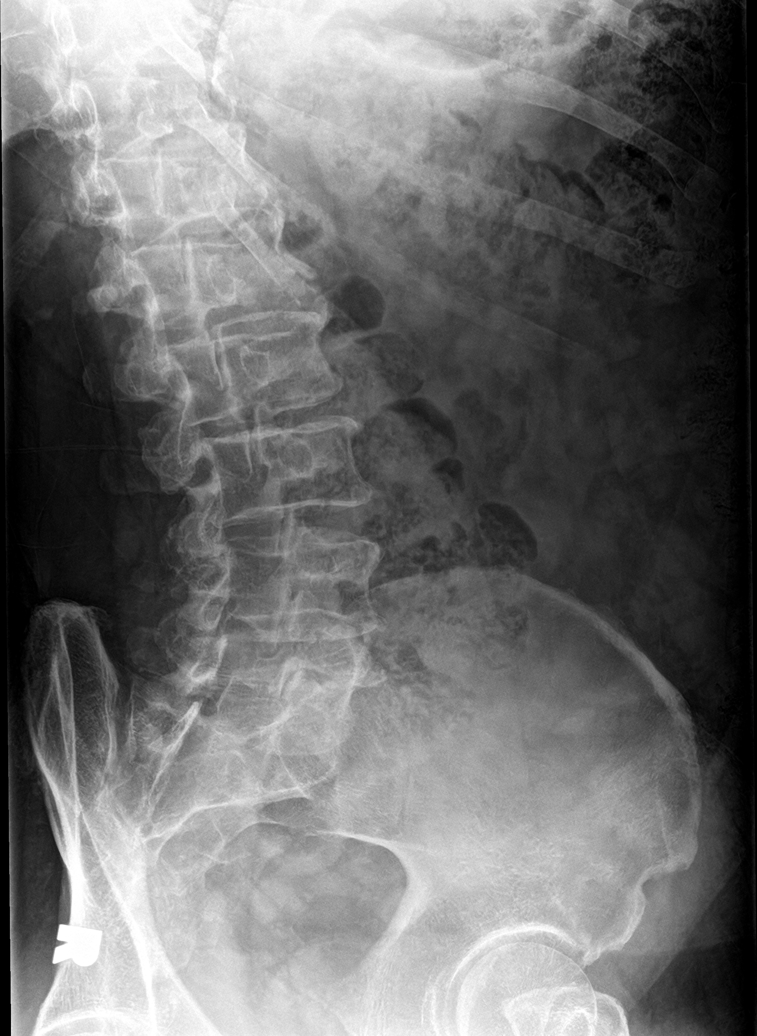

[l-spine lat]
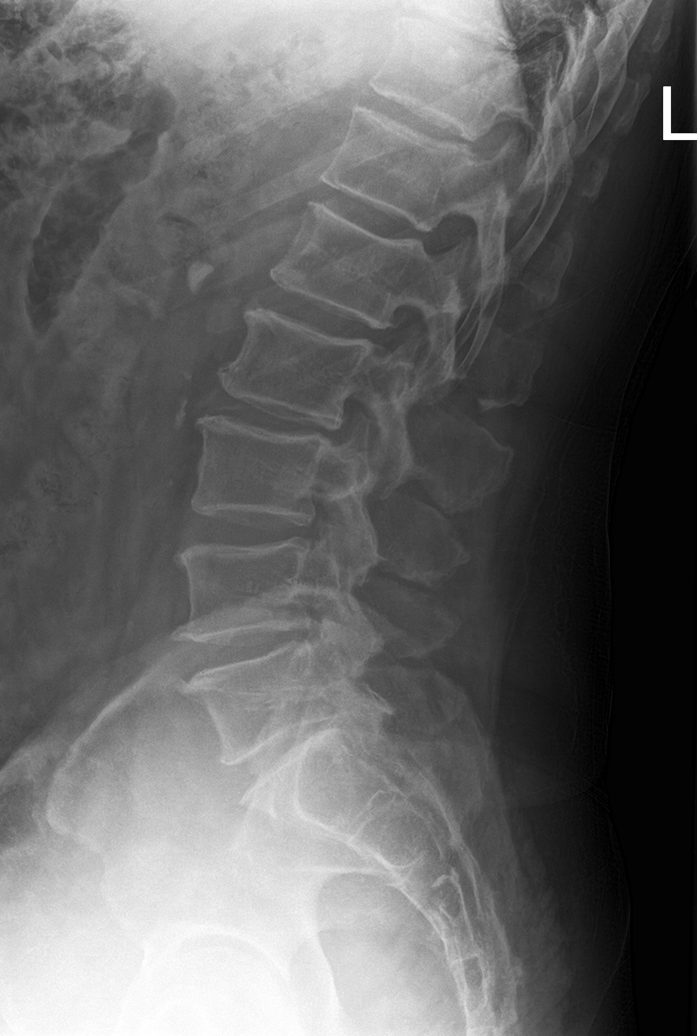

[l-spine spot]
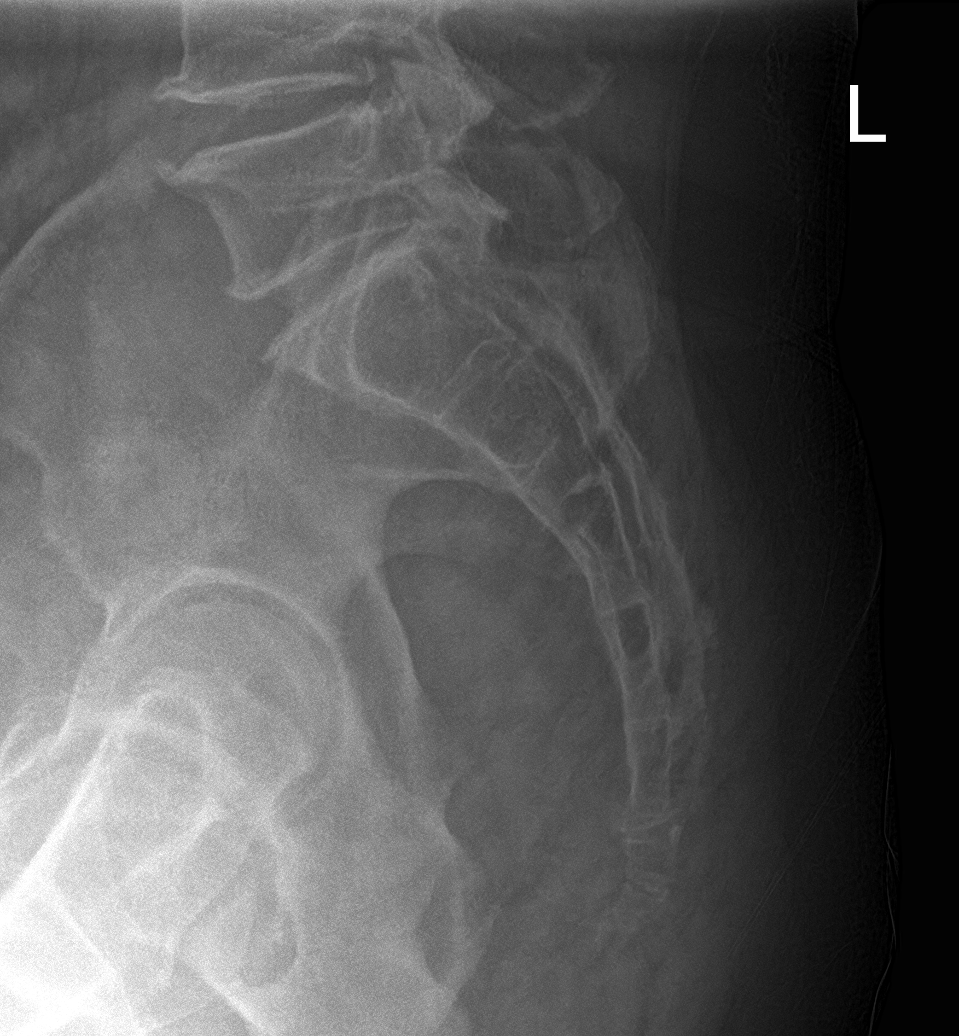

[5 of 5 positions shown; findings below may reference images not displayed]

FINDINGS: Five lumbar type vertebral bodies. No acute fracture or subluxation.
Vertebral body heights are preserved.

Alignment is normal. Unchanged mild disc height loss at L2-L3,
L4-L5, and L5-S1. Unchanged mild lower lumbar facet arthropathy.

1.2 cm calculus in the right kidney.
IMPRESSION: 1. Unchanged mild multilevel lumbar spondylosis.
2. 1.2 cm right renal calculus.

## 2022-01-16 NOTE — Telephone Encounter (Signed)
Attempted to schedule lmov  Due for labs and fu visit End.

## 2022-02-05 NOTE — Progress Notes (Signed)
[place]   Telephone:([phone] Fax:([phone]) [phone]   Clinic Follow up Note   Patient Care Team: Debbrah Alar, NP as PCP - General (Internal Medicine) End, Harrell Gave, MD as PCP - Cardiology (Cardiology) Deneise Lever, MD as Consulting Physician (Pulmonary Disease) [name], MD as Consulting Physician (General Surgery) Irene Shipper, MD as Consulting Physician (Gastroenterology) Remi Haggard, MD as Consulting Physician (Urology) Alla Feeling, NP as Nurse Practitioner (Medical Oncology) 02/06/2022  CHIEF COMPLAINT: Follow-up neuroendocrine tumor of the ileum  CURRENT THERAPY: Surveillance  INTERVAL HISTORY: Mr. Petitjean returns for follow-up as scheduled, last seen by Dr. Burr Medico 10/09/2021.  He feels well in general.  He has noticed blood pressure fluctuations, highest at home 160/117 and lowest 147/60.  He plans to check it twice a day and keep a log.  He has not seen PCP in about 2 years.  His daughter gave him COVID in December, he has been vaccinated.  Denies any other concerns such as unintentional weight loss, fatigue, abdominal pain/bloating, diarrhea, or flushing.  All other systems were reviewed with the patient and are negative.  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   Fatty liver    fatty liver disease   History of indigestion    History of kidney stones    Hyperlipidemia    Hypertension    Non-obstructive CAD (coronary artery disease)    a. 2016 Neg MV; b. 10/2017 Cath: LM nl, LAD 13m D1/2/3 nl, LCX 20p, RCA nl, EF 50-55%.    Sleep apnea    C-PAP   SOB (shortness of breath) 2019   after climbing stairs    SURGICAL HISTORY: Past Surgical History:  Procedure Laterality Date   COLONOSCOPY  01/2021   CYSTOSCOPY W/ URETERAL STENT PLACEMENT Right 07/04/2021   Procedure: CYSTOSCOPY WITH RETROGRADE PYELOGRAM/DIAGNOSTIC URETEROSCOPY/URETERAL STENT PLACEMENT;  Surgeon: HArdis Hughs MD;  Location: [facility];  Service: Urology;  Laterality: Right;   CYSTOSCOPY/URETEROSCOPY/HOLMIUM LASER/STENT PLACEMENT Right 07/25/2021   Procedure: CYSTOSCOPY, URETEROSCOPY, PYELOSCOPY, HOLMIUM LASER, STENT EXCHANGE;  Surgeon: NRemi Haggard MD;  Location: [facility];  Service: Urology;  Laterality: Right;  1 HR   EXTRACORPOREAL SHOCK WAVE LITHOTRIPSY Right 06/26/2021   Procedure: EXTRACORPOREAL SHOCK WAVE LITHOTRIPSY (ESWL);  Surgeon: BLucas Mallow MD;  [facility]  Service: Urology;  Laterality: Right;   LEFT HEART CATH AND CORONARY ANGIOGRAPHY Left 10/29/2017   Procedure: LEFT HEART CATH AND CORONARY ANGIOGRAPHY;  Surgeon: ENelva Bush MD;  [facility];  Service: Cardiovascular;  Laterality: Left;   [place]  [place] a [place]    I have reviewed the social history and family history with the patient and they are unchanged from previous note.  ALLERGIES:  is allergic to atorvastatin and tape.  MEDICATIONS:  Current Outpatient Medications  Medication Sig Dispense Refill   aspirin EC 81 MG tablet Take 1 tablet (81  mg total) by mouth daily. 90 tablet 3   losartan (COZAAR) 25 MG tablet Take 1 tablet (25 mg total) by mouth daily. 90 tablet 3   Omega-3 Fatty Acids (FISH OIL) 1000 MG CAPS Take 2,000 mg by mouth 2 (two) times daily.     No current facility-administered medications for this visit.    PHYSICAL EXAMINATION: ECOG PERFORMANCE STATUS: 0 - Asymptomatic  Vitals:   02/06/22 1002  BP: (!) 145/73  Pulse: 62  Resp: 17  Temp: 98.3 F  (36.8 C)  SpO2: 99%   Filed Weights   02/06/22 1002  Weight: 223 lb 1.6 oz (101.2 kg)    GENERAL:alert, no distress and comfortable SKIN: No rash EYES: sclera clear LUNGS: clear with normal breathing effort HEART: regular rate & rhythm, no lower extremity edema ABDOMEN:abdomen soft, non-tender and normal bowel sounds NEURO: alert & oriented x 3 with fluent speech, no focal motor deficits   LABORATORY DATA:  I have reviewed the data as listed    Latest Ref Rng & Units 02/06/2022    9:41 AM 10/09/2021    1:09 PM 06/28/2021   11:04 PM  CBC  WBC 4.0 - 10.5 K/uL 5.3  6.3  12.5   Hemoglobin 13.0 - 17.0 g/dL 14.6  14.2  14.8   Hematocrit 39.0 - 52.0 % 42.4  40.6  42.0   Platelets 150 - 400 K/uL 188  199  224         Latest Ref Rng & Units 02/06/2022    9:41 AM 10/09/2021    1:09 PM 09/20/2021    8:29 AM  CMP  Glucose 70 - 99 mg/dL 115  99  98   BUN 8 - 23 mg/dL _0 Creatinine 0.61 - 1.24 mg/dL 1.10  1.16  1.18   Sodium 135 - 145 mmol/L 139  138  141   Potassium 3.5 - 5.1 mmol/L 4.6  4.8  4.4   Chloride 98 - 111 mmol/L 105  104  104   CO2 22 - 32 mmol/L _1 Calcium 8.9 - 10.3 mg/dL 9.9  9.6  9.6   Total Protein 6.5 - 8.1 g/dL 7.4  7.4  7.1   Total Bilirubin 0.3 - 1.2 mg/dL 0.6  0.6  0.6   Alkaline Phos 38 - 126 U/L 63  77  94   AST 15 - 41 U/L _2 ALT 0 - 44 U/L _3 RADIOGRAPHIC STUDIES: I have personally reviewed the radiological images as listed and agreed with the findings in the report. No results found.   ASSESSMENT & PLAN: Dandy Lazaro. is a 66 y.o. male with    1. Neuroendocrine tumor of the terminal ileum, grade 1, pT2N1, stage III, Ki-67 < 1% -initially presented with hematuria. CT AP 12/02/20 showed incidental 1.9 cm ileum/small bowel mass.  -CT chest 01/02/21 showed a small indeterminate right lung nodule, otherwise negative for metastatic disease. Pathology from colonoscopy on 02/15/21 under Dr. Henrene Pastor showed low  grade neuroendocrine tumor.   -He underwent partial colectomy by Dr. Johney Maine on 05/10/2021, path showed well differentiated 2.2 cm NET in the terminal ileum invading the muscularis propria, clear margins, 2/12 positive lymph nodes, and < 2 mitoses per 2 millimeter squared.  This was staged pT2pN1 -Baseline chromogranin A and urine 5-HIAA 06/22/21 are normal -DOTATATE PET on 06/22/21 was  NED, with favored physiologic activity in pancreas and stable RLL pulmonary nodule. -doing well on surveillance, plan to repeat detectnet PET 05/2022 prior to next visit    2. Hematuria and kidney stones -f/up with urology Dr. Milford Cage at [facility] -S/p lithotripsy 06/26/21 by Dr. Gloriann Loan, ureteral stent placement 07/04/21 by Dr. Louis Meckel, and stent exchange 07/25/21 by Dr. Milford Cage -not discussed today   3. HTN, GERD, OSA -Per PCP -He knows to avoid certain foods and meds including PPI, aspirin 2 days prior to tumor marker testing  Disposition: Mr. Huge is clinically doing well.  Exam is benign, labs are unremarkable.  We will follow up on the pending chromogranin A from today. There is no clinical evidence of disease recurrence.  Continue surveillance.  Next lab in [place] PET scan in 4 months, with follow-up few days after.   I encouraged him to continue healthy active lifestyle and stay up to date on age appropriate health maintenance and cancer screenings.    Orders Placed This Encounter  Procedures   NM PET (CU-64 DETECTNET)SKULL TO MID THIGH    Standing Status:   Future    Standing Expiration Date:   02/07/2023    Order Specific Question:   If indicated for the ordered procedure, I authorize the administration of a radiopharmaceutical per Radiology protocol    Answer:   Yes    Order Specific Question:   Preferred imaging location?    Answer:   [facility]   All questions were answered. The patient knows to call the clinic with any problems, questions or concerns. No barriers to  learning were detected.      Alla Feeling, NP 02/06/22

## 2022-02-06 ENCOUNTER — Inpatient Hospital Stay: Payer: 59 | Attending: Nurse Practitioner

## 2022-02-06 ENCOUNTER — Inpatient Hospital Stay (HOSPITAL_BASED_OUTPATIENT_CLINIC_OR_DEPARTMENT_OTHER): Payer: 59 | Admitting: Nurse Practitioner

## 2022-02-06 ENCOUNTER — Other Ambulatory Visit: Payer: Self-pay

## 2022-02-06 ENCOUNTER — Encounter: Payer: Self-pay | Admitting: Nurse Practitioner

## 2022-02-06 VITALS — BP 145/73 | HR 62 | Temp 98.3°F | Resp 17 | Ht 69.0 in | Wt 223.1 lb

## 2022-02-06 DIAGNOSIS — Z7982 Long term (current) use of aspirin: Secondary | ICD-10-CM | POA: Diagnosis not present

## 2022-02-06 DIAGNOSIS — Z79899 Other long term (current) drug therapy: Secondary | ICD-10-CM | POA: Diagnosis not present

## 2022-02-06 DIAGNOSIS — C7A012 Malignant carcinoid tumor of the ileum: Secondary | ICD-10-CM

## 2022-02-06 DIAGNOSIS — D3A012 Benign carcinoid tumor of the ileum: Secondary | ICD-10-CM

## 2022-02-06 LAB — COMPREHENSIVE METABOLIC PANEL
ALT: 14 U/L (ref 0–44)
AST: 17 U/L (ref 15–41)
Albumin: 4.4 g/dL (ref 3.5–5.0)
Alkaline Phosphatase: 63 U/L (ref 38–126)
Anion gap: 4 — ABNORMAL LOW (ref 5–15)
BUN: 20 mg/dL (ref 8–23)
CO2: 30 mmol/L (ref 22–32)
Calcium: 9.9 mg/dL (ref 8.9–10.3)
Chloride: 105 mmol/L (ref 98–111)
Creatinine, Ser: 1.1 mg/dL (ref 0.61–1.24)
GFR, Estimated: >60 mL/min (ref >60)
Glucose, Bld: 115 mg/dL — ABNORMAL HIGH (ref 70–99)
Potassium: 4.6 mmol/L (ref 3.5–5.1)
Sodium: 139 mmol/L (ref 135–145)
Total Bilirubin: 0.6 mg/dL (ref 0.3–1.2)
Total Protein: 7.4 g/dL (ref 6.5–8.1)

## 2022-02-06 LAB — CBC WITH DIFFERENTIAL/PLATELET
Abs Immature Granulocytes: 0.02 10*3/uL (ref 0.00–0.07)
Basophils Absolute: 0 10*3/uL (ref 0.0–0.1)
Basophils Relative: 1 %
Eosinophils Absolute: 0.2 10*3/uL (ref 0.0–0.5)
Eosinophils Relative: 4 %
HCT: 42.4 % (ref 39.0–52.0)
Hemoglobin: 14.6 g/dL (ref 13.0–17.0)
Immature Granulocytes: 0 %
Lymphocytes Relative: 22 %
Lymphs Abs: 1.2 10*3/uL (ref 0.7–4.0)
MCH: 30.4 pg (ref 26.0–34.0)
MCHC: 34.4 g/dL (ref 30.0–36.0)
MCV: 88.3 fL (ref 80.0–100.0)
Monocytes Absolute: 0.4 10*3/uL (ref 0.1–1.0)
Monocytes Relative: 7 %
Neutro Abs: 3.5 10*3/uL (ref 1.7–7.7)
Neutrophils Relative %: 66 %
Platelets: 188 10*3/uL (ref 150–400)
RBC: 4.8 MIL/uL (ref 4.22–5.81)
RDW: 12 % (ref 11.5–15.5)
WBC: 5.3 10*3/uL (ref 4.0–10.5)
nRBC: 0 % (ref 0.0–0.2)

## 2022-02-09 LAB — CHROMOGRANIN A: Chromogranin A (ng/mL): 55.6 ng/mL (ref 0.0–101.8)

## 2022-04-12 ENCOUNTER — Encounter (HOSPITAL_COMMUNITY)
Admission: RE | Admit: 2022-04-12 | Discharge: 2022-04-12 | Disposition: A | Discharge status: Home / Self Care | Payer: 59 | Source: Ambulatory Visit | Attending: Nurse Practitioner | Admitting: Nurse Practitioner

## 2022-04-12 DIAGNOSIS — C7A012 Malignant carcinoid tumor of the ileum: Secondary | ICD-10-CM | POA: Insufficient documentation

## 2022-04-12 MED ORDER — COPPER CU 64 DOTATATE 1 MCI/ML IV SOLN
4.0000 | Freq: Once | INTRAVENOUS | Status: AC
  Administered 2022-04-12: 4.03 via INTRAVENOUS

## 2022-04-18 ENCOUNTER — Telehealth: Payer: Self-pay

## 2022-04-18 NOTE — Telephone Encounter (Signed)
This nurse left a message for this patient to return call to clinic related to scan results and recommendations by the provider mentioned below.  No further concerns at this time.

## 2022-04-18 NOTE — Telephone Encounter (Signed)
-----   Message from Alla Feeling, NP sent at 04/18/2022  9:13 AM EDT ----- Could you please call pt, he did not check my message after 5 days. Please let him know surveillance scan was done a little early which is fine, it is clear! No evidence of recurrent or metastatic disease. Remind him of October f/up. Thanks, Regan Rakers, NP

## 2022-04-19 ENCOUNTER — Telehealth: Payer: Self-pay | Admitting: *Deleted

## 2022-04-19 NOTE — Telephone Encounter (Signed)
Patient called Ryan [name] - he was returning call from [facility] patient - provided information on scan results and upcoming appts as directed by provider, LBurton NP: "Please let him know surveillance scan was done a little early which is fine, it is clear! No evidence of recurrent or metastatic disease. Remind him of October f/up." Patient verbalized understanding and said he and his wife had read results on mychart. Stated he appreciated call from provider's office.

## 2022-04-25 NOTE — Progress Notes (Signed)
Cardiology Office Note    Date:  04/27/2022   ID:  Ryan [name]., DOB Apr 14, 1956, MRN [id number]  PCP:  Debbrah Alar, NP  Cardiologist:  Nelva Bush, MD  Electrophysiologist:  None   Chief Complaint: Follow up  History of Present Illness:   Jsoeph [name]. is a 66 y.o. 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   Prior negative ischemic evaluation 2016.  Echo in early 2018 showed normal LV systolic function.  In early 2019, he was evaluated with exertional substernal chest pain and underwent diagnostic LHC which revealed nonobstructive CAD as outlined below.  Most recent echo from 10/2019 demonstrated an EF of 60 to 65%, no regional wall motion abnormalities, grade 2 diastolic dysfunction, normal RV systolic function and ventricular cavity size, mildly dilated left atrium, and mild mitral valve regurgitation.  He was last seen in the office in 08/2021 and without symptoms of angina or decompensation.  He remained very active at baseline.  He reported 1 episode of tachypalpitations.  He was not on statin therapy secondary to swelling of the hands.  Labs obtained at that time showed his LDL had increased from the 70s to 166 off statin therapy.  He preferred dietary changes over trial of rosuvastatin at that time.  He comes in continue to do well from a cardiac perspective and is without symptoms of angina or decompensation.  He remains active at baseline without cardiac limitation.  He did have an episode of chest tightness approximately 4 to 5 months ago while splitting wood.  He feels like this was in the context of possible recurrent COVID infection as there was associated headache,  congestion, and fatigue.  He has been without symptoms of chest tightness since.  He also reports a brief episode of palpitations approximately 2 months prior that were short-lived.  He has been asymptomatic since.  No dizziness, presyncope, or syncope.  He is tolerating aspirin and losartan without issues.  He has improved his diet.   Labs independently reviewed: 01/2022 - Hgb 14.6, PLT 188, potassium 4.6, BUN 20, serum creatinine 1.1, albumin 4.4, AST/ALT normal 08/2021 - TC 232, TG 105, HDL 47, LDL 166 04/2021 - magnesium 2.0 03/2021 - A1c 5.6 06/2020 - TSH normal   Past Medical History:  Diagnosis Date   Abnormal liver function    history of    Atypical chest pain    Blind left eye    blind left eye since birth   Cancer [facility])    skin:  basal cell carcinoma/ on face,back and neck   Carcinoid tumor of ileum s/p robotic colectomy 05/10/2021 05/10/2021   Cataract    Bil   CHF (congestive heart failure) ([place]) 9562   Diastolic dysfunction    Z.10/863 Echo: EF 55-60%, Gr1 DD, triv MR.   Fatty liver    fatty liver disease   History of indigestion    History of kidney stones    Hyperlipidemia    Hypertension    Non-obstructive CAD (coronary artery disease)    a. 2016 Neg MV; b. 10/2017 Cath: LM nl, LAD 37m D1/2/3 nl, LCX 20p, RCA nl, EF 50-55%.   Sleep apnea    C-PAP   SOB (shortness of  breath) 2019   after climbing stairs    Past Surgical History:  Procedure Laterality Date   COLONOSCOPY  01/2021   CYSTOSCOPY W/ URETERAL STENT PLACEMENT Right 07/04/2021   Procedure: CYSTOSCOPY WITH RETROGRADE PYELOGRAM/DIAGNOSTIC URETEROSCOPY/URETERAL STENT PLACEMENT;  Surgeon: Ardis Hughs, MD;  Location: [facility];  Service: Urology;  Laterality: Right;   CYSTOSCOPY/URETEROSCOPY/HOLMIUM LASER/STENT PLACEMENT Right 07/25/2021   Procedure: CYSTOSCOPY, URETEROSCOPY, PYELOSCOPY, HOLMIUM LASER, STENT EXCHANGE;  Surgeon: Remi Haggard, MD;  Location: [facility];  Service: Urology;  Laterality:  Right;  1 HR   EXTRACORPOREAL SHOCK WAVE LITHOTRIPSY Right 06/26/2021   Procedure: EXTRACORPOREAL SHOCK WAVE LITHOTRIPSY (ESWL);  Surgeon: Lucas Mallow, MD;  Location: [facility];  Service: Urology;  Laterality: Right;   LEFT HEART CATH AND CORONARY ANGIOGRAPHY Left 10/29/2017   Procedure: LEFT HEART CATH AND CORONARY ANGIOGRAPHY;  Surgeon: Nelva Bush, MD;  Location: [facility] [facility];  Service: Cardiovascular;  Laterality: Left;   [place]   as a child    Current Medications: Current Meds  Medication Sig   aspirin EC 81 MG tablet Take 1 tablet (81 mg total) by mouth daily.   losartan (COZAAR) 25 MG tablet Take 1 tablet (25 mg total) by mouth daily.   Omega-3 Fatty Acids (FISH OIL) 1000 MG CAPS Take 2,000 mg by mouth 2 (two) times daily.   vitamin E 200 UNIT capsule Take 200 Units by mouth daily.    Allergies:   Atorvastatin and Tape   Social History   Socioeconomic History   Marital status: Married    Spouse name: Not on file   Number of children: 1   Years of education: Not on file   Highest education level: Not on file  Occupational History   Occupation: Designer, industrial/product: [employer]  Tobacco Use   Smoking status: Light Smoker    Types: Cigars   Smokeless tobacco: Never   Tobacco comments:    Smoked cigarettes and pipe on/off since 20's, now occasional cigar once per month  Vaping Use   Vaping Use: Never used  Substance and Sexual Activity   Alcohol use: Yes    Alcohol/week: 2.0 standard drinks of alcohol    Types: 1 Cans of beer, 1 Standard drinks or equivalent per week    Comment: occasional drink beer   Drug use: No   Sexual activity: Not on file  Other Topics Concern   Not on file  Social History Narrative   Retired Engineer, building services    Social Determinants of Health   Financial Resource Strain: Not on file  Food Insecurity: Not on file  Transportation Needs: Not on file   Physical Activity: Not on file  Stress: Not on file  Social Connections: Not on file     Family History:  The patient's family history includes Cancer in his father and mother; Colon cancer in his mother; Diabetes in his mother and sister; Heart attack in his father; Heart disease in his father; Hypertension in his mother; Lung cancer in his father; Sinusitis in his sister. There is no history of Colon polyps, Esophageal cancer, Stomach cancer, or Rectal cancer.  ROS:   12-point review of systems is negative unless otherwise noted in HPI.   EKGs/Labs/Other Studies Reviewed:    Studies reviewed were summarized above. The additional studies were reviewed today:  2D echo 11/05/2019: 1. Left ventricular ejection fraction, by estimation, is 60  to 65%. The  left ventricle has normal function. The left ventricle has no regional  wall motion abnormalities. Left ventricular diastolic parameters are  consistent with Grade II diastolic  dysfunction (pseudonormalization).   2. Right ventricular systolic function is normal. The right ventricular  size is normal. Tricuspid regurgitation signal is inadequate for assessing  PA pressure.   3. Left atrial size was mildly dilated.   4. Mild mitral valve regurgitation.  __________   LHC 10/29/2017: Conclusions: Mild to moderate coronary artery disease, with long 50% stenosis of the mid/distal LAD and 20% ostial LCx disease. Low normal left ventricular contraction (LVEF 50-55%). Upper normal left ventricular filling pressure (LVEDP 15 mmHg). __________   2D echo 10/17/2016: - Left ventricle: The cavity size was normal. Wall thickness was    increased in a pattern of mild LVH. Systolic function was normal.    The estimated ejection fraction was in the range of 55% to 60%.    Doppler parameters are consistent with abnormal left ventricular    relaxation (grade 1 diastolic dysfunction).  - Mitral valve: Mildly thickened leaflets . There was trivial     regurgitation.  - Right ventricle: The cavity size was normal. Systolic function    was normal.   EKG:  EKG is ordered today.  The EKG ordered today demonstrates NSR, 66 bpm, no acute ST-T changes  Recent Labs: 05/11/2021: Magnesium 2.0 02/06/2022: ALT 14; BUN 20; Creatinine, Ser 1.10; Hemoglobin 14.6; Platelets 188; Potassium 4.6; Sodium 139  Recent Lipid Panel    Component Value Date/Time   CHOL 232 (H) 09/20/2021 0829   TRIG 105 09/20/2021 0829   HDL 47 09/20/2021 0829   CHOLHDL 4.9 09/20/2021 0829   CHOLHDL 2 08/26/2019 0854   VLDL 12.8 08/26/2019 0854   LDLCALC 166 (H) 09/20/2021 0829   LDLCALC 69 02/07/2018 1658   LDLDIRECT 144.3 11/17/2007 1623    PHYSICAL EXAM:    VS:  BP 132/78 (BP Location: Left Arm, Patient Position: Sitting, Cuff Size: Large)   Pulse 66   Ht '5\' 9"'$  (1.753 m)   Wt 227 lb 1.6 oz (103 kg)   SpO2 97%   BMI 33.54 kg/m   BMI: Body mass index is 33.54 kg/m.  Physical Exam Vitals reviewed.  Constitutional:      Appearance: He is well-developed.  HENT:     Head: Normocephalic and atraumatic.  Eyes:     General:        Right eye: No discharge.        Left eye: No discharge.  Neck:     Vascular: No JVD.  Cardiovascular:     Rate and Rhythm: Normal rate and regular rhythm.     Heart sounds: Normal heart sounds, S1 normal and S2 normal. Heart sounds not distant. No midsystolic click and no opening snap. No murmur heard.    No friction rub.  Pulmonary:     Effort: Pulmonary effort is normal. No respiratory distress.     Breath sounds: Normal breath sounds. No decreased breath sounds, wheezing or rales.  Chest:     Chest wall: No tenderness.  Abdominal:     General: There is no distension.  Musculoskeletal:     Cervical back: Normal range of motion.     Right lower leg: No edema.     Left lower leg: No edema.  Skin:    General: Skin is warm and dry.     Nails: There is no clubbing.  Neurological:  Mental Status: He is alert and  oriented to person, place, and time.  Psychiatric:        Speech: Speech normal.        Behavior: Behavior normal.        Thought Content: Thought content normal.        Judgment: Judgment normal.     Wt Readings from Last 3 Encounters:  04/27/22 227 lb 1.6 oz (103 kg)  02/06/22 223 lb 1.6 oz (101.2 kg)  12/04/21 222 lb 3.2 oz (100.8 kg)     ASSESSMENT & PLAN:   Nonobstructive CAD: Is doing well without any symptoms concerning for angina.  He did have an episode of chest tightness approximately 4 to 5 months ago, though believes this was in the context of a URI/recurrent COVID infection.  He has been asymptomatic since.  Given prior reassuring work-up, and in the context of the patient being asymptomatic, further testing is deferred at this time.  Should he have recurrent symptoms this can be revisited then.  Continue risk factor modification.  HFpEF: He appears euvolemic and well compensated and is not requiring a standing diuretic.  His weight remains stable.  HTN: Blood pressure is reasonably controlled in the office today.  He remains on losartan.  HLD: LDL 166 in 08/2021.  This was obtained while off statin therapy.  Check LFT, lipid panel, and direct LDL at this time.  Based on results, consider trial of rosuvastatin if indicated.  Palpitations: He reports a brief episode of palpitations approximately 1 to 2 months ago with symptoms resolving spontaneously.  Given this, outpatient cardiac monitoring will likely have low yield.  We did discuss a Kardia mobile device.  OSA: CPAP.   Disposition: F/u with Dr. Saunders Revel or an APP in 12 months.   Medication Adjustments/Labs and Tests Ordered: Current medicines are reviewed at length with the patient today.  Concerns regarding medicines are outlined above. Medication changes, Labs and Tests ordered today are summarized above and listed in the Patient Instructions accessible in Encounters.   Signed, Christell Faith, PA-C 04/27/2022 4:41 PM      [facility] [address] [place] [address] [place], [place] [address] [phone]

## 2022-04-27 ENCOUNTER — Other Ambulatory Visit
Admission: RE | Admit: 2022-04-27 | Discharge: 2022-04-27 | Disposition: A | Discharge status: Home / Self Care | Payer: 59 | Source: Ambulatory Visit | Attending: Physician Assistant | Admitting: Physician Assistant

## 2022-04-27 ENCOUNTER — Encounter: Payer: Self-pay | Admitting: Physician Assistant

## 2022-04-27 ENCOUNTER — Ambulatory Visit: Payer: 59 | Attending: Physician Assistant | Admitting: Physician Assistant

## 2022-04-27 VITALS — BP 132/78 | HR 66 | Ht 69.0 in | Wt 227.1 lb

## 2022-04-27 DIAGNOSIS — I10 Essential (primary) hypertension: Secondary | ICD-10-CM

## 2022-04-27 DIAGNOSIS — I2510 Atherosclerotic heart disease of native coronary artery without angina pectoris: Secondary | ICD-10-CM | POA: Diagnosis present

## 2022-04-27 DIAGNOSIS — I5032 Chronic diastolic (congestive) heart failure: Secondary | ICD-10-CM

## 2022-04-27 DIAGNOSIS — R002 Palpitations: Secondary | ICD-10-CM

## 2022-04-27 DIAGNOSIS — Z9989 Dependence on other enabling machines and devices: Secondary | ICD-10-CM

## 2022-04-27 DIAGNOSIS — E785 Hyperlipidemia, unspecified: Secondary | ICD-10-CM | POA: Diagnosis not present

## 2022-04-27 DIAGNOSIS — G4733 Obstructive sleep apnea (adult) (pediatric): Secondary | ICD-10-CM

## 2022-04-27 DIAGNOSIS — Z789 Other specified health status: Secondary | ICD-10-CM

## 2022-04-27 LAB — HEPATIC FUNCTION PANEL
ALT: 20 U/L (ref 0–44)
AST: 22 U/L (ref 15–41)
Albumin: 4.2 g/dL (ref 3.5–5.0)
Alkaline Phosphatase: 63 U/L (ref 38–126)
Bilirubin, Direct: <0.1 mg/dL (ref 0.0–0.2)
Total Bilirubin: 1.1 mg/dL (ref 0.3–1.2)
Total Protein: 7.7 g/dL (ref 6.5–8.1)

## 2022-04-27 LAB — LIPID PANEL
Cholesterol: 244 mg/dL — ABNORMAL HIGH (ref 0–200)
HDL: 51 mg/dL (ref >40)
LDL Cholesterol: 180 mg/dL — ABNORMAL HIGH (ref 0–99)
Total CHOL/HDL Ratio: 4.8 RATIO
Triglycerides: 65 mg/dL (ref <150)
VLDL: 13 mg/dL (ref 0–40)

## 2022-04-27 NOTE — Patient Instructions (Addendum)
Medication Instructions:  Your physician recommends that you continue on your current medications as directed. Please refer to the Current Medication list given to you today.  *If you need a refill on your cardiac medications before your next appointment, please call your pharmacy*   Lab Work: Lipid, liver and direct LDL today If you have labs (blood work) drawn today and your tests are completely normal, you will receive your results only by: [place] (if you have MyChart) OR A paper copy in the mail If you have any lab test that is abnormal or we need to change your treatment, we will call you to review the results.   Follow-Up: At [facility], you and your health needs are our priority.  As part of our continuing mission to provide you with exceptional heart care, we have created designated Provider Care Teams.  These Care Teams include your primary Cardiologist (physician) and Advanced Practice Providers (APPs -  Physician Assistants and Nurse Practitioners) who all work together to provide you with the care you need, when you need it.  Your next appointment:   1 year(s)  The format for your next appointment:   In Person  Provider:   You may see Nelva Bush, MD or one of the following Advanced Practice Providers on your designated Care Team:   Murray Hodgkins, NP Christell Faith, PA-C Cadence Kathlen Mody, PA-C Gerrie Nordmann, NP    [employer]  FDA-cleared EKG at your fingertips. - [employer]   Agricultural engineer, [employer]. [email or web address]://store.alivecor.com/products/[email or web address]   FDA-cleared, clinical grade mobile EKG monitor: Jodelle Red is the most clinically-validated mobile EKG used by the world's leading cardiac care medical professionals.  This may be useful in monitoring palpitations.  We do not have access to have them emailed and reviewed but will be glad to review while in the office.     Important Information About Sugar

## 2022-04-28 LAB — LDL CHOLESTEROL, DIRECT: Direct LDL: 176 mg/dL — ABNORMAL HIGH (ref 0–99)

## 2022-05-01 ENCOUNTER — Telehealth: Payer: Self-pay | Admitting: *Deleted

## 2022-05-01 DIAGNOSIS — I2510 Atherosclerotic heart disease of native coronary artery without angina pectoris: Secondary | ICD-10-CM

## 2022-05-01 DIAGNOSIS — E785 Hyperlipidemia, unspecified: Secondary | ICD-10-CM

## 2022-05-01 DIAGNOSIS — Z789 Other specified health status: Secondary | ICD-10-CM

## 2022-05-01 DIAGNOSIS — I5032 Chronic diastolic (congestive) heart failure: Secondary | ICD-10-CM

## 2022-05-01 NOTE — Telephone Encounter (Signed)
Left voicemail message to call back for review of results.  

## 2022-05-01 NOTE — Telephone Encounter (Signed)
-----   Message from Rise Mu, PA-C sent at 05/01/2022  8:04 AM EDT ----- Liver function normal Total cholesterol mildly elevated, triglycerides well controlled, HDL at goal, LDL remains above goal  Recommendations: -We tried lifestyle modification at his last visit, however LDL remains above goal -Would recommend trial of rosuvastatin 20 mg daily if he is agreeable with a follow-up fasting lipid panel and LFT 2 months thereafter

## 2022-05-21 NOTE — Telephone Encounter (Signed)
Left voicemail message to call back for review of results.  

## 2022-05-22 ENCOUNTER — Encounter: Payer: Self-pay | Admitting: *Deleted

## 2022-05-22 MED ORDER — ROSUVASTATIN CALCIUM 20 MG PO TABS: 20.0000 mg | ORAL_TABLET | Freq: Every day | ORAL | 3 refills | Status: DC

## 2022-05-22 NOTE — Telephone Encounter (Signed)
Left voicemail message to call back. This is call #3 so will mail out letter with results and to call back if any questions.

## 2022-05-28 ENCOUNTER — Telehealth: Payer: Self-pay | Admitting: Internal Medicine

## 2022-05-28 DIAGNOSIS — I2510 Atherosclerotic heart disease of native coronary artery without angina pectoris: Secondary | ICD-10-CM

## 2022-05-28 DIAGNOSIS — Z789 Other specified health status: Secondary | ICD-10-CM

## 2022-05-28 DIAGNOSIS — E785 Hyperlipidemia, unspecified: Secondary | ICD-10-CM

## 2022-05-28 DIAGNOSIS — I5032 Chronic diastolic (congestive) heart failure: Secondary | ICD-10-CM

## 2022-05-28 NOTE — Telephone Encounter (Signed)
Patient states he was returning call. Please advise ?

## 2022-05-29 NOTE — Telephone Encounter (Signed)
Patient had called back to review results. He prefers to hold off on starting medication and would like to just have the repeat labs to see if they are improving once he really focuses on his diet. Orders have been entered and he had no further questions at this time.

## 2022-06-04 ENCOUNTER — Inpatient Hospital Stay: Payer: 59 | Attending: Hematology

## 2022-06-04 ENCOUNTER — Other Ambulatory Visit: Payer: Self-pay

## 2022-06-04 DIAGNOSIS — Z79899 Other long term (current) drug therapy: Secondary | ICD-10-CM | POA: Insufficient documentation

## 2022-06-04 DIAGNOSIS — C7A012 Malignant carcinoid tumor of the ileum: Secondary | ICD-10-CM | POA: Diagnosis present

## 2022-06-04 DIAGNOSIS — D3A012 Benign carcinoid tumor of the ileum: Secondary | ICD-10-CM

## 2022-06-04 LAB — CBC WITH DIFFERENTIAL/PLATELET
Abs Immature Granulocytes: 0.02 10*3/uL (ref 0.00–0.07)
Basophils Absolute: 0 10*3/uL (ref 0.0–0.1)
Basophils Relative: 1 %
Eosinophils Absolute: 0.2 10*3/uL (ref 0.0–0.5)
Eosinophils Relative: 3 %
HCT: 41.4 % (ref 39.0–52.0)
Hemoglobin: 14.6 g/dL (ref 13.0–17.0)
Immature Granulocytes: 0 %
Lymphocytes Relative: 22 %
Lymphs Abs: 1.1 10*3/uL (ref 0.7–4.0)
MCH: 31 pg (ref 26.0–34.0)
MCHC: 35.3 g/dL (ref 30.0–36.0)
MCV: 87.9 fL (ref 80.0–100.0)
Monocytes Absolute: 0.4 10*3/uL (ref 0.1–1.0)
Monocytes Relative: 8 %
Neutro Abs: 3.4 10*3/uL (ref 1.7–7.7)
Neutrophils Relative %: 66 %
Platelets: 185 10*3/uL (ref 150–400)
RBC: 4.71 MIL/uL (ref 4.22–5.81)
RDW: 11.9 % (ref 11.5–15.5)
WBC: 5.2 10*3/uL (ref 4.0–10.5)
nRBC: 0 % (ref 0.0–0.2)

## 2022-06-04 LAB — COMPREHENSIVE METABOLIC PANEL WITH GFR
ALT: 12 U/L (ref 0–44)
AST: 17 U/L (ref 15–41)
Albumin: 4.4 g/dL (ref 3.5–5.0)
Alkaline Phosphatase: 69 U/L (ref 38–126)
Anion gap: 3 — ABNORMAL LOW (ref 5–15)
BUN: 24 mg/dL — ABNORMAL HIGH (ref 8–23)
CO2: 30 mmol/L (ref 22–32)
Calcium: 9.5 mg/dL (ref 8.9–10.3)
Chloride: 106 mmol/L (ref 98–111)
Creatinine, Ser: 1.08 mg/dL (ref 0.61–1.24)
GFR, Estimated: >60 mL/min (ref >60)
Glucose, Bld: 101 mg/dL — ABNORMAL HIGH (ref 70–99)
Potassium: 4.3 mmol/L (ref 3.5–5.1)
Sodium: 139 mmol/L (ref 135–145)
Total Bilirubin: 0.6 mg/dL (ref 0.3–1.2)
Total Protein: 7.5 g/dL (ref 6.5–8.1)

## 2022-06-05 LAB — CHROMOGRANIN A: Chromogranin A (ng/mL): 64.6 ng/mL (ref 0.0–101.8)

## 2022-06-07 ENCOUNTER — Encounter: Payer: Self-pay | Admitting: Hematology

## 2022-06-07 ENCOUNTER — Inpatient Hospital Stay (HOSPITAL_BASED_OUTPATIENT_CLINIC_OR_DEPARTMENT_OTHER): Payer: 59 | Admitting: Hematology

## 2022-06-07 ENCOUNTER — Other Ambulatory Visit: Payer: Self-pay

## 2022-06-07 VITALS — BP 141/81 | HR 68 | Temp 98.2°F | Resp 16 | Ht 69.0 in | Wt 226.1 lb

## 2022-06-07 DIAGNOSIS — C7A012 Malignant carcinoid tumor of the ileum: Secondary | ICD-10-CM

## 2022-06-07 NOTE — Progress Notes (Signed)
[name]   [phone] [phone]   Clinic Follow up Note   Patient Care Team: Debbrah Alar, NP as PCP - General (Internal Medicine) End, Harrell Gave, MD as PCP - Cardiology (Cardiology) Deneise Lever, MD as Consulting Physician (Pulmonary Disease) [name], MD as Consulting Physician (General Surgery) Irene Shipper, MD as Consulting Physician (Gastroenterology) Remi Haggard, MD as Consulting Physician (Urology) Alla Feeling, NP as Nurse Practitioner (Medical Oncology)  Date of Service:  06/07/2022  CHIEF COMPLAINT: f/u of neuroendocrine tumor of ileum  CURRENT THERAPY:  Surveillance  ASSESSMENT & PLAN:  Ryan [name]. is a 66 y.o. male with   1. Neuroendocrine tumor of the terminal ileum, grade 1, pT2N1, stage III, Ki-67 < 1% -presented with hematuria. CT AP 12/02/20 showed incidental 1.9 cm ileum/small bowel mass. CT chest 01/02/21 negative for metastatic disease.  -colonoscopy on 02/15/21 under Dr. Henrene Pastor, path showed low grade neuroendocrine tumor.   -s/p partial colectomy by Dr. Johney Maine on 05/10/21, path showed well differentiated 2.2 cm NET in the terminal ileum invading the muscularis propria, clear margins, 2/12 positive lymph nodes. Staged pT2pN1 -Baseline chromogranin A and 24hr urine from 06/22/21 were normal -surveillance DOTATATE PET on 04/12/22 showed NED. -he is clinically do well, no new or recurrent symptoms. Labs from 10/9 reviewed, all WNL, including chromogranin A.      Plan: -f/u in 5 months with lab a week before, will order next CT on next visit    No problem-specific Assessment & Plan notes found for this encounter.   INTERVAL HISTORY:  Ryan [name]. is here for a follow up of NET. He was last seen by NP Lacie on 02/06/22. He presents to the clinic accompanied by his wife. He reports he is doing well overall. He denies blood in the stool, pain, or bowel issues.   All other systems were  reviewed with the patient and are negative.  MEDICAL HISTORY:  Past Medical History:  Diagnosis Date   Abnormal liver function    history of    Atypical chest pain    Blind left eye    blind left eye since birth   Cancer [facility])    skin:  basal cell carcinoma/ on face,back and neck   Carcinoid tumor of ileum s/p robotic colectomy 05/10/2021 05/10/2021   Cataract    Bil   CHF (congestive heart failure) ([place]) 4166   Diastolic dysfunction    A.01/3015 Echo: EF 55-60%, Gr1 DD, triv MR.   Fatty liver    fatty liver disease   History of indigestion    History of kidney stones    Hyperlipidemia    Hypertension    Non-obstructive CAD (coronary artery disease)    a. 2016 Neg MV; b. 10/2017 Cath: LM nl, LAD 58m D1/2/3 nl, LCX 20p, RCA nl, EF 50-55%.   Sleep apnea    C-PAP   SOB (shortness of breath) 2019   after climbing stairs    SURGICAL HISTORY: Past Surgical History:  Procedure Laterality Date   COLONOSCOPY  01/2021   CYSTOSCOPY W/ URETERAL STENT PLACEMENT Right 07/04/2021   Procedure: CYSTOSCOPY WITH RETROGRADE PYELOGRAM/DIAGNOSTIC URETEROSCOPY/URETERAL STENT PLACEMENT;  Surgeon: HArdis Hughs MD;  Location[facility];  Service: Urology;  Laterality: Right;   CYSTOSCOPY/URETEROSCOPY/HOLMIUM LASER/STENT PLACEMENT Right 07/25/2021   Procedure: CYSTOSCOPY, URETEROSCOPY, PYELOSCOPY, HOLMIUM LASER, STENT EXCHANGE;  Surgeon: NRemi Haggard MD;  [facility]: [facility];  Service: Urology;  Laterality: Right;  1 HR  EXTRACORPOREAL SHOCK WAVE LITHOTRIPSY Right 06/26/2021   Procedure: EXTRACORPOREAL SHOCK WAVE LITHOTRIPSY (ESWL);  Surgeon: Lucas Mallow, MD;  Location: [facility];  Service: Urology;  Laterality: Right;   LEFT HEART CATH AND CORONARY ANGIOGRAPHY Left 10/29/2017   Procedure: LEFT HEART CATH AND CORONARY ANGIOGRAPHY;  Surgeon: Nelva Bush, MD;  Location: [facility];  Service: Cardiovascular;  Laterality: Left;   [place]   as a child    I have reviewed the social history and family history with the patient and they are unchanged from previous note.  ALLERGIES:  is allergic to atorvastatin and tape.  MEDICATIONS:  Current Outpatient Medications  Medication Sig Dispense Refill   aspirin EC 81 MG tablet Take 1 tablet (81 mg total) by mouth daily. 90 tablet 3   losartan (COZAAR) 25 MG tablet Take 1 tablet (25 mg total) by mouth daily. 90 tablet 3   Omega-3 Fatty Acids (FISH OIL) 1000 MG CAPS Take 2,000 mg by mouth 2 (two) times daily.     rosuvastatin (CRESTOR) 20 MG tablet Take 1 tablet (20 mg total) by mouth daily. 90 tablet 3   vitamin E 200 UNIT capsule Take 200 Units by mouth daily.     No current facility-administered medications for this visit.    PHYSICAL EXAMINATION: ECOG PERFORMANCE STATUS: 0 - Asymptomatic  Vitals:   06/07/22 1025  BP: (!) 141/81  Pulse: 68  Resp: 16  Temp: 98.2 F (36.8 C)  SpO2: 100%   Wt Readings from Last 3 Encounters:  06/07/22 226 lb 1.6 oz (102.6 kg)  04/27/22 227 lb 1.6 oz (103 kg)  02/06/22 223 lb 1.6 oz (101.2 kg)     GENERAL:alert, no distress and comfortable SKIN: skin color, texture, turgor are normal, no rashes or significant lesions EYES: normal, Conjunctiva are pink and non-injected, sclera clear  NECK: supple, thyroid normal size, non-tender, without nodularity LYMPH:  no palpable lymphadenopathy in the cervical, axillary  LUNGS: clear to auscultation and percussion with normal breathing effort HEART: regular rate & rhythm and no murmurs and no lower extremity edema ABDOMEN:abdomen soft, non-tender and normal bowel sounds Musculoskeletal:no cyanosis of digits and no clubbing  NEURO: alert & oriented x 3 with fluent speech, no focal motor/sensory deficits  LABORATORY DATA:  I have reviewed the data as listed    Latest Ref Rng & Units 06/04/2022   10:07 AM 02/06/2022    9:41 AM 10/09/2021    1:09 PM  CBC  WBC 4.0 - 10.5  K/uL 5.2  5.3  6.3   Hemoglobin 13.0 - 17.0 g/dL 14.6  14.6  14.2   Hematocrit 39.0 - 52.0 % 41.4  42.4  40.6   Platelets 150 - 400 K/uL 185  188  199         Latest Ref Rng & Units 06/04/2022   10:07 AM 04/27/2022    4:22 PM 02/06/2022    9:41 AM  CMP  Glucose 70 - 99 mg/dL 101   115   BUN 8 - 23 mg/dL 24   20   Creatinine 0.61 - 1.24 mg/dL 1.08   1.10   Sodium 135 - 145 mmol/L 139   139   Potassium 3.5 - 5.1 mmol/L 4.3   4.6   Chloride 98 - 111 mmol/L 106   105   CO2 22 - 32 mmol/L 30   30   Calcium 8.9 - 10.3 mg/dL  9.5   9.9   Total Protein 6.5 - 8.1 g/dL 7.5  7.7  7.4   Total Bilirubin 0.3 - 1.2 mg/dL 0.6  1.1  0.6   Alkaline Phos 38 - 126 U/L 69  63  63   AST 15 - 41 U/L _0 ALT 0 - 44 U/L _1 RADIOGRAPHIC STUDIES: I have personally reviewed the radiological images as listed and agreed with the findings in the report. No results found.    No orders of the defined types were placed in this encounter.  All questions were answered. The patient knows to call the clinic with any problems, questions or concerns. No barriers to learning was detected. The total time spent in the appointment was 20 minutes.     Truitt Merle, MD 06/07/2022   I, Wilburn Mylar, am acting as scribe for Truitt Merle, MD.   I have reviewed the above documentation for accuracy and completeness, and I agree with the above.

## 2022-09-21 ENCOUNTER — Other Ambulatory Visit: Payer: Self-pay | Admitting: Physician Assistant

## 2022-10-09 ENCOUNTER — Encounter: Payer: Self-pay | Admitting: Internal Medicine

## 2022-10-09 NOTE — Telephone Encounter (Signed)
Error

## 2022-10-10 ENCOUNTER — Encounter: Payer: Self-pay | Admitting: Nurse Practitioner

## 2022-10-10 ENCOUNTER — Other Ambulatory Visit
Admission: RE | Admit: 2022-10-10 | Discharge: 2022-10-10 | Disposition: A | Discharge status: Home / Self Care | Payer: BC Managed Care – PPO | Source: Ambulatory Visit | Attending: Nurse Practitioner | Admitting: Nurse Practitioner

## 2022-10-10 ENCOUNTER — Ambulatory Visit: Payer: BC Managed Care – PPO | Attending: Nurse Practitioner | Admitting: Nurse Practitioner

## 2022-10-10 VITALS — BP 142/86 | HR 66 | Ht 69.0 in | Wt 231.2 lb

## 2022-10-10 DIAGNOSIS — R002 Palpitations: Secondary | ICD-10-CM

## 2022-10-10 DIAGNOSIS — I5032 Chronic diastolic (congestive) heart failure: Secondary | ICD-10-CM

## 2022-10-10 DIAGNOSIS — E785 Hyperlipidemia, unspecified: Secondary | ICD-10-CM

## 2022-10-10 DIAGNOSIS — I25118 Atherosclerotic heart disease of native coronary artery with other forms of angina pectoris: Secondary | ICD-10-CM

## 2022-10-10 DIAGNOSIS — R072 Precordial pain: Secondary | ICD-10-CM

## 2022-10-10 DIAGNOSIS — I10 Essential (primary) hypertension: Secondary | ICD-10-CM

## 2022-10-10 LAB — BASIC METABOLIC PANEL
Anion gap: 5 (ref 5–15)
BUN: 22 mg/dL (ref 8–23)
CO2: 28 mmol/L (ref 22–32)
Calcium: 9.3 mg/dL (ref 8.9–10.3)
Chloride: 105 mmol/L (ref 98–111)
Creatinine, Ser: 1.11 mg/dL (ref 0.61–1.24)
GFR, Estimated: >60 mL/min (ref >60)
Glucose, Bld: 89 mg/dL (ref 70–99)
Potassium: 4.5 mmol/L (ref 3.5–5.1)
Sodium: 138 mmol/L (ref 135–145)

## 2022-10-10 MED ORDER — ROSUVASTATIN CALCIUM 20 MG PO TABS: 20.0000 mg | ORAL_TABLET | Freq: Every day | ORAL | 3 refills | Status: DC

## 2022-10-10 MED ORDER — METOPROLOL TARTRATE 50 MG PO TABS: 50.0000 mg | ORAL_TABLET | Freq: Once | ORAL | 0 refills | Status: DC

## 2022-10-10 MED ORDER — LOSARTAN POTASSIUM 50 MG PO TABS: 50.0000 mg | ORAL_TABLET | Freq: Every day | ORAL | 3 refills | Status: DC

## 2022-10-10 NOTE — Progress Notes (Signed)
Office Visit    Patient Name: Ryan [name]. Date of Encounter: 10/10/2022  Primary Care Provider:  Debbrah Alar, NP Primary Cardiologist:  Nelva Bush, MD  Chief Complaint    67 y/o ? w/ a h/o atypical chest pain, nonobstructive CAD, hypertension, hyperlipidemia, and GERD, who presents for follow-up related to precordial chest pain.  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 10/2019 Echo: EF 60-65%, no rwma, GrII DD, nl RV fxn. Mildly dil LA.   Fatty liver    fatty liver disease   History of indigestion    History of kidney stones    Hyperlipidemia    Hypertension    Non-obstructive CAD (coronary artery disease)    a. 2016 Neg MV; b. 10/2017 Cath: LM nl, LAD 38m D1/2/3 nl, LCX 20p, RCA nl, EF 50-55%.   Sleep apnea    C-PAP   SOB (shortness of breath) 2019   after climbing stairs   Past Surgical History:  Procedure Laterality Date   COLONOSCOPY  01/2021   CYSTOSCOPY W/ URETERAL STENT PLACEMENT Right 07/04/2021   Procedure: CYSTOSCOPY WITH RETROGRADE PYELOGRAM/DIAGNOSTIC URETEROSCOPY/URETERAL STENT PLACEMENT;  Surgeon: HArdis Hughs MD;  [facility];  Service: Urology;  Laterality: Right;   CYSTOSCOPY/URETEROSCOPY/HOLMIUM LASER/STENT PLACEMENT Right 07/25/2021   Procedure: CYSTOSCOPY, URETEROSCOPY, PYELOSCOPY, HOLMIUM LASER, STENT EXCHANGE;  Surgeon: NRemi Haggard MD;  [facility];  Service: Urology;  Laterality: Right;  1 HR   EXTRACORPOREAL SHOCK WAVE LITHOTRIPSY Right 06/26/2021   Procedure: EXTRACORPOREAL SHOCK WAVE  LITHOTRIPSY (ESWL);  Surgeon: BLucas Mallow MD;  [facility]  Service: Urology;  Laterality: Right;   LEFT HEART CATH AND CORONARY ANGIOGRAPHY Left 10/29/2017   Procedure: LEFT HEART CATH AND CORONARY ANGIOGRAPHY;  Surgeon: ENelva Bush MD;  [facility];  Service: Cardiovascular;  Laterality: Left;   SKIN CANCER EXCISION     TONSILLECTOMY  1965   as a child    Allergies  Allergies  Allergen Reactions   Atorvastatin Other (See Comments)    Hands swelled   Tape     Caused redness    History of Present Illness    67year old male with above past medical history including hypertension, hyperlipidemia, GERD, hepatic steatosis, atypical chest pain, and nonobstructive CAD.  He previously had negative ischemic evaluation in 2016.  Echocardiogram in early 2018 showed normal LV function.  In early 2019, he was evaluated complaints of exertional chest pain and underwent diagnostic catheterization revealing mild to moderate nonobstructive CAD.  In the setting of lower extremity swelling, echocardiogram in March 2021 was performed and showed an EF of 60 to 65% with grade 2 diastolic dysfunction, normal RV function.  Mr. JJacobucciwas last seen in cardiology clinic in September 2023, at which time he reported an episode of chest pain occurring 4 to 5 months earlier in the setting of an upper respiratory infection/recurrent COVID, but no symptoms since then.  He also reported rare, episodic palpitations, and outpatient monitoring was felt to be low yield and therefore deferred.  Over the past month, he has had 2 episodes of chest pain, the first while cutting wood, and the second while sitting in his home.  Both episodes were associated w/ elevated blood pressures and both resolved spontaneously within a few mins.  He denies palpitations, dyspnea, pnd, orthopnea, n, v, dizziness, syncope, edema, weight gain, or early satiety.   Home Medications     Current Outpatient Medications  Medication Sig Dispense Refill   aspirin EC 81 MG tablet Take 1 tablet (81 mg total) by mouth daily. 90 tablet 3   metoprolol tartrate (LOPRESSOR) 50 MG tablet Take 1 tablet (50 mg total) by mouth once for 1 dose. Take metoprolol 66m x 1, 2 hrs prior to coronary CT angiogram. 1 tablet 0   Omega-3 Fatty Acids (FISH OIL) 1000 MG CAPS Take 2,000 mg by mouth 2 (two) times daily.     vitamin E 200 UNIT capsule Take 200 Units by mouth daily.     losartan (COZAAR) 50 MG tablet Take 1 tablet (50 mg total) by mouth daily. 90 tablet 3   rosuvastatin (CRESTOR) 20 MG tablet Take 1 tablet (20 mg total) by mouth daily. 90 tablet 3   No current facility-administered medications for this visit.     Review of Systems    As above, 2 episodes of chest pain over the past month.  He denies palpitations, dyspnea, pnd, orthopnea, n, v, dizziness, syncope, edema, weight gain, or early satiety.  All other systems reviewed and are otherwise negative except as noted above.    Physical Exam    VS:  BP (!) 140/82 (BP Location: Left Arm, Patient Position: Sitting, Cuff Size: Normal)   Pulse 66   Ht 5' 9"$  (1.753 m)   Wt 231 lb 3.2 oz (104.9 kg)   SpO2 98%   BMI 34.14 kg/m  , BMI Body mass index is 34.14 kg/m.     Vitals:   10/10/22 1512 10/10/22 1715  BP: (!) 140/82 (!) 142/86  Pulse: 66   SpO2: 98%     GEN: Well nourished, well developed, in no acute distress. HEENT: normal. Neck: Supple, no JVD, carotid bruits, or masses. Cardiac: RRR, no murmurs, rubs, or gallops. No clubbing, cyanosis, edema.  Radials 2+/PT 2+ and equal bilaterally.  Respiratory:  Respirations regular and unlabored, clear to auscultation bilaterally. GI: Soft, nontender, nondistended, BS + x 4. MS: no deformity or atrophy. Skin: warm and dry, no rash. Neuro:  Strength and sensation are intact. Psych: Normal affect.  Accessory Clinical Findings    ECG personally reviewed by me today - RSR, 66 -  no acute changes.  Lab Results  Component Value Date   WBC 5.2 06/04/2022   HGB 14.6 06/04/2022   HCT 41.4 06/04/2022   MCV 87.9 06/04/2022   PLT 185 06/04/2022   Lab Results  Component Value Date   CREATININE 1.11 10/10/2022   BUN 22 10/10/2022   NA 138 10/10/2022   K 4.5 10/10/2022   CL 105 10/10/2022   CO2 28 10/10/2022   Lab Results  Component Value Date   ALT 12 06/04/2022   AST 17 06/04/2022   ALKPHOS 69 06/04/2022   BILITOT 0.6 06/04/2022   Lab Results  Component Value Date   CHOL 244 (H) 04/27/2022   HDL 51 04/27/2022   LDLCALC 180 (H) 04/27/2022   LDLDIRECT 176 (H) 04/27/2022  TRIG 65 04/27/2022   CHOLHDL 4.8 04/27/2022    Lab Results  Component Value Date   HGBA1C 5.6 04/25/2021    Assessment & Plan    1.  Precordial chest pain/Nonobstructive CAD:  Prior cath in 2019 w/ mild to mod nonobs dzs.  Over the past month, he's had 2 episodes of rest and exertional c/p, which lasted a few mins and resolved spontaneously.  He is very concerned about these episodes.  I will check a BMET today and arrange for a coronary CTA to re-evaluate anatomy and r/o obstructive CAD.  He will need a dose of metoprolol 27m x 1 prior to the test.  Cont asa.  He reports that he is not taking rosuvastatin but would like a refill, and he'll start taking.  2.  Essential HTN:  BP elevated.  Increasing losartan to 557mdaily.  3.  HL:  LDL 180 in 04/2022.  Prev reluctant to take statin but now willing.  Rx for rosuvastatin 206maily sent in to his pharmacy.  4.  Chronic HFpEF:  euvolemic.  Inc losartan in setting of HTN.  5.  Palpitations:  quiescent.  6.  Disposition:  F/U bmet.  Cor CTA.  F/u in 1 month.   ChrMurray HodgkinsP 10/10/2022, 5:08 PM

## 2022-10-10 NOTE — Patient Instructions (Signed)
Medication Instructions:  Your physician has recommended you make the following change in your medication:   losartan (COZAAR) 50 MG tablet - Take 1 tablet (50 mg total) by mouth daily  *If you need a refill on your cardiac medications before your next appointment, please call your pharmacy*   Lab Work: Your physician recommends that you get lab work for cardiac CT: BMP  Medical Mall Entrance at [facility] 1st desk on the right to check in (REGISTRATION)  Lab hours: Monday- Friday (7:30 am- 5:30 pm)   If you have labs (blood work) drawn today and your tests are completely normal, you will receive your results only by: MyChart Message (if you have MyChart) OR A paper copy in the mail If you have any lab test that is abnormal or we need to change your treatment, we will call you to review the results.   Testing/Procedures:   Your cardiac CT will be scheduled at one of the below locations:   [facility] [address] [place], [place] [address] [phone] [facility] [place], [place] [address] [phone]  If scheduled at [facility] or [facility], please arrive 15 mins early for check-in and test prep.   Please follow these instructions carefully (unless otherwise directed):  On the Night Before the Test: Be sure to Drink plenty of water. Do not consume any caffeinated/decaffeinated beverages or chocolate 12 hours prior to your test. Do not take any antihistamines 12 hours prior to your test.  On the Day of the Test: Drink plenty of water until 1 hour prior to the test. Do not eat any food 1 hour prior to test. You may take your regular medications prior to the test.  Take metoprolol 50 mg (Lopressor) two hours prior to test.      After the Test: Drink plenty of water. After receiving IV contrast, you may experience a mild  flushed feeling. This is normal. On occasion, you may experience a mild rash up to 24 hours after the test. This is not dangerous. If this occurs, you can take Benadryl 25 mg and increase your fluid intake. If you experience trouble breathing, this can be serious. If it is severe call 911 IMMEDIATELY. If it is mild, please call our office.   We will call to schedule your test 2-4 weeks out understanding that some insurance companies will need an authorization prior to the service being performed.   For non-scheduling related questions, please contact the cardiac imaging nurse navigator should you have any questions/concerns: Marchia Bond, Cardiac Imaging Nurse Navigator Gordy Clement, Cardiac Imaging Nurse Navigator [facility] Direct Office Dial: [phone]   For scheduling needs, including cancellations and rescheduling, please call [place], [phone].    Follow-Up: At [facility], you and your health needs are our priority.  As part of our continuing mission to provide you with exceptional heart care, we have created designated Provider Care Teams.  These Care Teams include your primary Cardiologist (physician) and Advanced Practice Providers (APPs -  Physician Assistants and Nurse Practitioners) who all work together to provide you with the care you need, when you need it.  We recommend signing up for the patient portal called "MyChart".  Sign up information is provided on this After Visit Summary.  MyChart is used to connect with patients for Virtual Visits (Telemedicine).  Patients are able to view  lab/test results, encounter notes, upcoming appointments, etc.  Non-urgent messages can be sent to your provider as well.   To learn more about what you can do with MyChart, go to [email or web address].    Your next appointment:   1 month(s)  Provider:   You may see Nelva Bush, MD or one of the following Advanced Practice Providers on your  designated Care Team:   Murray Hodgkins, NP  Other Instructions - None

## 2022-10-11 ENCOUNTER — Telehealth: Payer: Self-pay | Admitting: Family

## 2022-10-11 ENCOUNTER — Encounter: Payer: Self-pay | Admitting: *Deleted

## 2022-10-11 NOTE — Telephone Encounter (Signed)
See mychart.  

## 2022-10-12 ENCOUNTER — Telehealth (HOSPITAL_COMMUNITY): Payer: Self-pay | Admitting: *Deleted

## 2022-10-12 NOTE — Telephone Encounter (Signed)
Reaching out to patient to offer assistance regarding upcoming cardiac imaging study; pt verbalizes understanding of appt date/time, parking situation and where to check in, pre-test NPO status and medications ordered, and verified current allergies; name and call back number provided for further questions should they arise  Gordy Clement RN Navigator [facility] [place] [facility] [phone] office [phone] cell  Patient to take 70m metoprolol tartrate two hours prior to his cardiac CT scan. He is aware to arrive at 8:30am.

## 2022-10-15 ENCOUNTER — Ambulatory Visit (HOSPITAL_COMMUNITY)
Admission: RE | Admit: 2022-10-15 | Discharge: 2022-10-15 | Disposition: A | Discharge status: Home / Self Care | Payer: BC Managed Care – PPO | Source: Ambulatory Visit | Attending: Nurse Practitioner | Admitting: Nurse Practitioner

## 2022-10-15 ENCOUNTER — Other Ambulatory Visit: Payer: Self-pay | Admitting: Cardiology

## 2022-10-15 DIAGNOSIS — I25118 Atherosclerotic heart disease of native coronary artery with other forms of angina pectoris: Secondary | ICD-10-CM | POA: Diagnosis not present

## 2022-10-15 DIAGNOSIS — R931 Abnormal findings on diagnostic imaging of heart and coronary circulation: Secondary | ICD-10-CM | POA: Diagnosis not present

## 2022-10-15 MED ORDER — NITROGLYCERIN 0.4 MG SL SUBL
SUBLINGUAL_TABLET | SUBLINGUAL | Status: AC
  Filled 2022-10-15: qty 2

## 2022-10-15 MED ORDER — IOHEXOL 350 MG/ML SOLN
100.0000 mL | Freq: Once | INTRAVENOUS | Status: AC | PRN
  Administered 2022-10-15: 100 mL via INTRAVENOUS

## 2022-10-15 MED ORDER — NITROGLYCERIN 0.4 MG SL SUBL
0.8000 mg | SUBLINGUAL_TABLET | Freq: Once | SUBLINGUAL | Status: AC
  Administered 2022-10-15: 0.8 mg via SUBLINGUAL

## 2022-10-16 ENCOUNTER — Ambulatory Visit (HOSPITAL_BASED_OUTPATIENT_CLINIC_OR_DEPARTMENT_OTHER)
Admission: RE | Admit: 2022-10-16 | Discharge: 2022-10-16 | Disposition: A | Discharge status: Home / Self Care | Payer: BC Managed Care – PPO | Source: Ambulatory Visit | Attending: Cardiology | Admitting: Cardiology

## 2022-10-16 DIAGNOSIS — R931 Abnormal findings on diagnostic imaging of heart and coronary circulation: Secondary | ICD-10-CM | POA: Diagnosis not present

## 2022-10-16 DIAGNOSIS — I25118 Atherosclerotic heart disease of native coronary artery with other forms of angina pectoris: Secondary | ICD-10-CM | POA: Diagnosis not present

## 2022-10-31 ENCOUNTER — Other Ambulatory Visit: Payer: 59

## 2022-11-06 ENCOUNTER — Other Ambulatory Visit: Payer: Self-pay

## 2022-11-06 DIAGNOSIS — C7A012 Malignant carcinoid tumor of the ileum: Secondary | ICD-10-CM

## 2022-11-06 DIAGNOSIS — D3A012 Benign carcinoid tumor of the ileum: Secondary | ICD-10-CM

## 2022-11-07 ENCOUNTER — Other Ambulatory Visit: Payer: Self-pay

## 2022-11-07 ENCOUNTER — Ambulatory Visit: Payer: 59 | Admitting: Hematology

## 2022-11-07 ENCOUNTER — Inpatient Hospital Stay: Payer: BC Managed Care – PPO | Attending: Hematology

## 2022-11-07 DIAGNOSIS — D3A012 Benign carcinoid tumor of the ileum: Secondary | ICD-10-CM

## 2022-11-07 DIAGNOSIS — C7B8 Other secondary neuroendocrine tumors: Secondary | ICD-10-CM | POA: Diagnosis not present

## 2022-11-07 DIAGNOSIS — C7A8 Other malignant neuroendocrine tumors: Secondary | ICD-10-CM | POA: Insufficient documentation

## 2022-11-07 DIAGNOSIS — C7A012 Malignant carcinoid tumor of the ileum: Secondary | ICD-10-CM

## 2022-11-07 LAB — CMP (CANCER CENTER ONLY)
ALT: 17 U/L (ref 0–44)
AST: 20 U/L (ref 15–41)
Albumin: 4.4 g/dL (ref 3.5–5.0)
Alkaline Phosphatase: 64 U/L (ref 38–126)
Anion gap: 3 — ABNORMAL LOW (ref 5–15)
BUN: 18 mg/dL (ref 8–23)
CO2: 31 mmol/L (ref 22–32)
Calcium: 9.8 mg/dL (ref 8.9–10.3)
Chloride: 105 mmol/L (ref 98–111)
Creatinine: 1.19 mg/dL (ref 0.61–1.24)
GFR, Estimated: >60 mL/min (ref >60)
Glucose, Bld: 81 mg/dL (ref 70–99)
Potassium: 4.5 mmol/L (ref 3.5–5.1)
Sodium: 139 mmol/L (ref 135–145)
Total Bilirubin: 0.7 mg/dL (ref 0.3–1.2)
Total Protein: 7.3 g/dL (ref 6.5–8.1)

## 2022-11-07 LAB — CBC WITH DIFFERENTIAL (CANCER CENTER ONLY)
Abs Immature Granulocytes: 0.02 10*3/uL (ref 0.00–0.07)
Basophils Absolute: 0 10*3/uL (ref 0.0–0.1)
Basophils Relative: 0 %
Eosinophils Absolute: 0.2 10*3/uL (ref 0.0–0.5)
Eosinophils Relative: 3 %
HCT: 43.7 % (ref 39.0–52.0)
Hemoglobin: 15.3 g/dL (ref 13.0–17.0)
Immature Granulocytes: 0 %
Lymphocytes Relative: 24 %
Lymphs Abs: 1.3 10*3/uL (ref 0.7–4.0)
MCH: 30.2 pg (ref 26.0–34.0)
MCHC: 35 g/dL (ref 30.0–36.0)
MCV: 86.4 fL (ref 80.0–100.0)
Monocytes Absolute: 0.5 10*3/uL (ref 0.1–1.0)
Monocytes Relative: 8 %
Neutro Abs: 3.6 10*3/uL (ref 1.7–7.7)
Neutrophils Relative %: 65 %
Platelet Count: 182 10*3/uL (ref 150–400)
RBC: 5.06 MIL/uL (ref 4.22–5.81)
RDW: 11.6 % (ref 11.5–15.5)
WBC Count: 5.6 10*3/uL (ref 4.0–10.5)
nRBC: 0 % (ref 0.0–0.2)

## 2022-11-09 LAB — CHROMOGRANIN A: Chromogranin A (ng/mL): 49.5 ng/mL (ref 0.0–101.8)

## 2022-11-13 NOTE — Assessment & Plan Note (Signed)
grade 1, pT2N1, stage III, Ki-67 < 1% -presented with hematuria. CT AP 12/02/20 showed incidental 1.9 cm ileum/small bowel mass. CT chest 01/02/21 negative for metastatic disease.  -colonoscopy on 02/15/21 under Dr. Henrene Pastor, path showed low grade neuroendocrine tumor.   -s/p partial colectomy by Dr. Johney Maine on 05/10/21, path showed well differentiated 2.2 cm NET in the terminal ileum invading the muscularis propria, clear margins, 2/12 positive lymph nodes. Staged pT2pN1 -Baseline chromogranin A and 24hr urine from 06/22/21 were normal -surveillance DOTATATE PET on 04/12/22 showed NED.

## 2022-11-14 ENCOUNTER — Other Ambulatory Visit: Payer: Self-pay

## 2022-11-14 ENCOUNTER — Encounter: Payer: Self-pay | Admitting: Hematology

## 2022-11-14 ENCOUNTER — Inpatient Hospital Stay (HOSPITAL_BASED_OUTPATIENT_CLINIC_OR_DEPARTMENT_OTHER): Payer: BC Managed Care – PPO | Admitting: Hematology

## 2022-11-14 VITALS — BP 146/82 | HR 65 | Temp 97.9°F | Resp 18 | Ht 69.0 in | Wt 230.3 lb

## 2022-11-14 DIAGNOSIS — C7A8 Other malignant neuroendocrine tumors: Secondary | ICD-10-CM | POA: Diagnosis not present

## 2022-11-14 DIAGNOSIS — C7A012 Malignant carcinoid tumor of the ileum: Secondary | ICD-10-CM

## 2022-11-14 DIAGNOSIS — C7B8 Other secondary neuroendocrine tumors: Secondary | ICD-10-CM | POA: Diagnosis not present

## 2022-11-14 NOTE — Progress Notes (Signed)
[place]   Telephone:(336) [phone] Fax:(336) [phone]   Clinic Follow up Note   Patient Care Team: Debbrah Alar, NP as PCP - General (Internal Medicine) End, Harrell Gave, MD as PCP - Cardiology (Cardiology) Deneise Lever, MD as Consulting Physician (Pulmonary Disease) [name], MD as Consulting Physician (General Surgery) Irene Shipper, MD as Consulting Physician (Gastroenterology) Remi Haggard, MD as Consulting Physician (Urology) Alla Feeling, NP as Nurse Practitioner (Medical Oncology)  Date of Service:  11/14/2022  CHIEF COMPLAINT: f/u of neuroendocrine tumor of the ileum   CURRENT THERAPY:  Surveillance   ASSESSMENT:  Ryan Laos. is a 67 y.o. 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 CT AP 12/02/20 showed incidental 1.9 cm ileum/small bowel mass. CT chest 01/02/21 negative for metastatic disease.  -colonoscopy on 02/15/21 under Dr. Henrene Pastor, path showed low grade neuroendocrine tumor.   -s/p partial colectomy by Dr. Johney Maine on 05/10/21, path showed well differentiated 2.2 cm NET in the terminal ileum invading the muscularis propria, clear margins, 2/12 positive lymph nodes. Staged pT2pN1 -Baseline chromogranin A and 24hr urine from 06/22/21 were normal -surveillance DOTATATE PET on 04/12/22 showed NED. -Patient is clinically doing well, asymptomatic, lab reviewed, exam today was unremarkable.  No clinical concern for recurrence. -Continue monitoring, plan to repeat CT scan in August 2024.     PLAN: -lab reviewed - Order CT scan in August -lab and Ct scan in [place]    INTERVAL HISTORY:  Ryan Heyd. is here for a follow up of neuroendocrine tumor of ileum   He was last seen by me on 10/12/ He presents to the clinic accompanied by wife.  Pt denies having stomach issues Pt denies having bleeding and pain. Pt  is able to do house work.  Pt state that his BP has been having elevated BP and his PCP increase his medication.  All other systems were reviewed with the patient and are negative.  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 10/2019 Echo: EF 60-65%, no rwma, GrII DD, nl RV fxn. Mildly dil LA.   Fatty liver    fatty liver disease   History of indigestion    History of kidney stones    Hyperlipidemia    Hypertension    Non-obstructive CAD (coronary artery disease)    a. 2016 Neg MV; b. 10/2017 Cath: LM nl, LAD 57m, D1/2/3 nl, LCX 20p, RCA nl, EF 50-55%.   Sleep apnea    C-PAP   SOB (shortness of breath) 2019   after climbing stairs    SURGICAL HISTORY: Past Surgical History:  Procedure Laterality Date   COLONOSCOPY  01/2021   CYSTOSCOPY W/ URETERAL STENT PLACEMENT Right 07/04/2021   Procedure: CYSTOSCOPY WITH RETROGRADE PYELOGRAM/DIAGNOSTIC URETEROSCOPY/URETERAL STENT PLACEMENT;  Surgeon: Ardis Hughs, MD;  [facility];  Service: Urology;  Laterality: Right;   CYSTOSCOPY/URETEROSCOPY/HOLMIUM LASER/STENT  PLACEMENT Right 07/25/2021   Procedure: CYSTOSCOPY, URETEROSCOPY, PYELOSCOPY, HOLMIUM LASER, STENT EXCHANGE;  Surgeon: Remi Haggard, MD;  [facility];  Service: Urology;  Laterality: Right;  1 HR   EXTRACORPOREAL SHOCK WAVE LITHOTRIPSY Right 06/26/2021   Procedure: EXTRACORPOREAL SHOCK WAVE LITHOTRIPSY (ESWL);  Surgeon: Lucas Mallow, MD;  [facility];  Service: Urology;  Laterality: Right;   LEFT HEART CATH AND CORONARY ANGIOGRAPHY Left 10/29/2017   Procedure:  LEFT HEART CATH AND CORONARY ANGIOGRAPHY;  Surgeon: Nelva Bush, MD;  Location: [place] [facility];  Service: Cardiovascular;  Laterality: Left;   [place]   as a child    I have reviewed the social history and family history with the patient and they are unchanged from previous note.  ALLERGIES:  is allergic to atorvastatin and tape.  MEDICATIONS:  Current Outpatient Medications  Medication Sig Dispense Refill   aspirin EC 81 MG tablet Take 1 tablet (81 mg total) by mouth daily. 90 tablet 3   losartan (COZAAR) 50 MG tablet Take 1 tablet (50 mg total) by mouth daily. 90 tablet 3   metoprolol tartrate (LOPRESSOR) 50 MG tablet Take 1 tablet (50 mg total) by mouth once for 1 dose. Take metoprolol 50mg  x 1, 2 hrs prior to coronary CT angiogram. 1 tablet 0   Omega-3 Fatty Acids (FISH OIL) 1000 MG CAPS Take 2,000 mg by mouth 2 (two) times daily.     rosuvastatin (CRESTOR) 20 MG tablet Take 1 tablet (20 mg total) by mouth daily. 90 tablet 3   vitamin E 200 UNIT capsule Take 200 Units by mouth daily.     No current facility-administered medications for this visit.    PHYSICAL EXAMINATION: ECOG PERFORMANCE STATUS: 0 - Asymptomatic  Vitals:   11/14/22 1047  BP: (!) 146/82  Pulse: 65  Resp: 18  Temp: 97.9 F (36.6 C)  SpO2: 100%   Wt Readings from Last 3 Encounters:  11/14/22 230 lb 4.8 oz (104.5 kg)  10/10/22 231 lb 3.2 oz (104.9 kg)  06/07/22 226 lb 1.6 oz (102.6 kg)     NECK: (-)supple, thyroid normal size, non-tender, without nodularity LYMPH:  (-) no palpable lymphadenopathy in the cervical, axillary  LUNGS:(-)  clear to auscultation and percussion with normal breathing effort HEART:(-)  regular rate & rhythm and no murmurs and no lower extremity edema ABDOMEN:(-) abdomen soft,(-)  non-tender and normal bowel sounds  LABORATORY DATA:  I have reviewed the data as listed    Latest Ref Rng & Units 11/07/2022   10:10 AM 06/04/2022    10:07 AM 02/06/2022    9:41 AM  CBC  WBC 4.0 - 10.5 K/uL 5.6  5.2  5.3   Hemoglobin 13.0 - 17.0 g/dL 15.3  14.6  14.6   Hematocrit 39.0 - 52.0 % 43.7  41.4  42.4   Platelets 150 - 400 K/uL 182  185  188         Latest Ref Rng & Units 11/07/2022   10:10 AM 10/10/2022    4:25 PM 06/04/2022   10:07 AM  CMP  Glucose 70 - 99 mg/dL 81  89  101   BUN 8 - 23 mg/dL 18  22  24    Creatinine 0.61 - 1.24 mg/dL 1.19  1.11  1.08   Sodium 135 - 145 mmol/L 139  138  139   Potassium 3.5 - 5.1 mmol/L 4.5  4.5  4.3  Chloride 98 - 111 mmol/L 105  105  106   CO2 22 - 32 mmol/L 31  28  30    Calcium 8.9 - 10.3 mg/dL 9.8  9.3  9.5   Total Protein 6.5 - 8.1 g/dL 7.3   7.5   Total Bilirubin 0.3 - 1.2 mg/dL 0.7   0.6   Alkaline Phos 38 - 126 U/L 64   69   AST 15 - 41 U/L 20   17   ALT 0 - 44 U/L 17   12       RADIOGRAPHIC STUDIES: I have personally reviewed the radiological images as listed and agreed with the findings in the report. No results found.    Orders Placed This Encounter  Procedures   CT CHEST ABDOMEN PELVIS W CONTRAST    Standing Status:   Future    Standing Expiration Date:   11/14/2023    Order Specific Question:   Preferred imaging location?    Answer:   [facility]    Order Specific Question:   Release to patient    Answer:   Immediate    Order Specific Question:   Is Oral Contrast requested for this exam?    Answer:   Yes, Per Radiology protocol   All questions were answered. The patient knows to call the clinic with any problems, questions or concerns. No barriers to learning was detected. The total time spent in the appointment was 20 minutes.     Truitt Merle, MD 11/14/2022   Felicity Coyer, CMA, am acting as scribe for Truitt Merle, MD.   I have reviewed the above documentation for accuracy and completeness, and I agree with the above.

## 2022-11-21 ENCOUNTER — Ambulatory Visit: Payer: BC Managed Care – PPO | Attending: Nurse Practitioner | Admitting: Nurse Practitioner

## 2022-11-21 ENCOUNTER — Encounter: Payer: Self-pay | Admitting: Nurse Practitioner

## 2022-11-21 VITALS — BP 128/72 | HR 65 | Ht 69.0 in | Wt 228.4 lb

## 2022-11-21 DIAGNOSIS — I10 Essential (primary) hypertension: Secondary | ICD-10-CM | POA: Diagnosis not present

## 2022-11-21 DIAGNOSIS — E785 Hyperlipidemia, unspecified: Secondary | ICD-10-CM

## 2022-11-21 DIAGNOSIS — R002 Palpitations: Secondary | ICD-10-CM

## 2022-11-21 DIAGNOSIS — R072 Precordial pain: Secondary | ICD-10-CM | POA: Diagnosis not present

## 2022-11-21 DIAGNOSIS — I2510 Atherosclerotic heart disease of native coronary artery without angina pectoris: Secondary | ICD-10-CM

## 2022-11-21 DIAGNOSIS — I5032 Chronic diastolic (congestive) heart failure: Secondary | ICD-10-CM

## 2022-11-21 NOTE — Patient Instructions (Signed)
Medication Instructions:  Your physician recommends that you continue on your current medications as directed. Please refer to the Current Medication list given to you today.  *If you need a refill on your cardiac medications before your next appointment, please call your pharmacy*   Lab Work: Lipid and hepatic panel to be drawn when fasting - Please go to the [facility]. You will check in at the front desk to the right as you walk into the atrium. Valet Parking is offered if needed. - No appointment needed. You may go any day between 7 am and 6 pm.  If you have labs (blood work) drawn today and your tests are completely normal, you will receive your results only by: [place] (if you have MyChart) OR A paper copy in the mail If you have any lab test that is abnormal or we need to change your treatment, we will call you to review the results.   Testing/Procedures: No testing ordered  Follow-Up: At [facility], you and your health needs are our priority.  As part of our continuing mission to provide you with exceptional heart care, we have created designated Provider Care Teams.  These Care Teams include your primary Cardiologist (physician) and Advanced Practice Providers (APPs -  Physician Assistants and Nurse Practitioners) who all work together to provide you with the care you need, when you need it.  We recommend signing up for the patient portal called "MyChart".  Sign up information is provided on this After Visit Summary.  MyChart is used to connect with patients for Virtual Visits (Telemedicine).  Patients are able to view lab/test results, encounter notes, upcoming appointments, etc.  Non-urgent messages can be sent to your provider as well.   To learn more about what you can do with MyChart, go to [email or web address].    Your next appointment:   6 month(s)  Provider:   You may see Nelva Bush, MD or one of the following Advanced Practice  Providers on your designated Care Team:   Murray Hodgkins, NP

## 2022-11-21 NOTE — Progress Notes (Signed)
Office Visit    Patient Name: Ryan [name]. Date of Encounter: 11/21/2022  Primary Care Provider:  Debbrah Alar, NP Primary Cardiologist:  Nelva Bush, MD  Chief Complaint    67 year old male with a history of atypical chest pain, nonobstructive CAD, hypertension, hyperlipidemia, hepatic steatosis, and GERD, who presents for follow-up related to chest pain.  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 10/2019 Echo: EF 60-65%, no rwma, GrII DD, nl RV fxn. Mildly dil LA.   Fatty liver    fatty liver disease   History of indigestion    History of kidney stones    Hyperlipidemia    Hypertension    Non-obstructive CAD (coronary artery disease)    a. 2016 Neg MV; b. 10/2017 Cath: LM nl, LAD 17m, D1/2/3 nl, LCX 20p, RCA nl, EF 50-55%; c. 09/2022 Cor CTA: Ca2+ = 180 (68th %'ile). LAD 0-24p, D2 0-24%, LCX 50-69ost (CTFFR normal).   Sleep apnea    C-PAP   SOB (shortness of breath) 2019   after climbing stairs   Past Surgical History:  Procedure Laterality Date   COLONOSCOPY  01/2021   CYSTOSCOPY W/ URETERAL STENT PLACEMENT Right 07/04/2021   Procedure: CYSTOSCOPY WITH RETROGRADE PYELOGRAM/DIAGNOSTIC URETEROSCOPY/URETERAL STENT PLACEMENT;  Surgeon: Ardis Hughs, MD;  Location: [facility];  Service: Urology;  Laterality: Right;   CYSTOSCOPY/URETEROSCOPY/HOLMIUM LASER/STENT PLACEMENT Right 07/25/2021   Procedure: CYSTOSCOPY, URETEROSCOPY, PYELOSCOPY, HOLMIUM LASER, STENT EXCHANGE;  Surgeon: Remi Haggard, MD;  Location: [facility];  Service: Urology;  Laterality:  Right;  1 HR   EXTRACORPOREAL SHOCK WAVE LITHOTRIPSY Right 06/26/2021   Procedure: EXTRACORPOREAL SHOCK WAVE LITHOTRIPSY (ESWL);  Surgeon: Lucas Mallow, MD;  Location: [facility];  Service: Urology;  Laterality: Right;   LEFT HEART CATH AND CORONARY ANGIOGRAPHY Left 10/29/2017   Procedure: LEFT HEART CATH AND CORONARY ANGIOGRAPHY;  Surgeon: Nelva Bush, MD;  Location: [facility];  Service: Cardiovascular;  Laterality: Left;   SKIN CANCER EXCISION     TONSILLECTOMY  1965   as a child    Allergies  Allergies  Allergen Reactions   Atorvastatin Other (See Comments)    Hands swelled   Tape     Caused redness    History of Present Illness    67 year old male with above past medical history including atypical chest pain, nonobstructive CAD, hypertension, hyperlipidemia, hepatic steatosis, and GERD.  He previously had a negative ischemic evaluation 2016.  Echo in early 2018 showed normal LV function.  In early 2019, he was evaluated with complaints of exertional chest pain and underwent diagnostic catheterization revealing mild to moderate nonobstructive CAD.  In the setting of lower extremity swelling, echo in March 2021 was performed and showed an EF of 60 to 65% with grade 2 diastolic dysfunction and normal RV function.  Mr. Gielow was last seen in cardiology clinic in February of this year, at which time he reported experiencing 2 episodes  of chest pain, first while cutting wood, and the second while resting at home.  Both episodes were associated with elevated blood pressures and resolved within a few minutes.  Blood pressure was elevated at clinic visit as well.  Losartan was increased to 50 mg daily and in the setting of chest pain, I arrange for coronary CT angiogram.  This was performed on February 19 and showed a calcium score of 180 with moderate disease in the left circumflex (50 to 69%), and mild LAD and second diagonal disease.  CTFFR analysis was  normal.  Since his last visit, he has done well.  He has had no further episodes of chest pain and remains active without symptoms or limitations.  His blood pressure has improved and is 128/72 today.  He denies PND, orthopnea, dizziness, syncope, edema, or early satiety.  Home Medications    Current Outpatient Medications  Medication Sig Dispense Refill   aspirin EC 81 MG tablet Take 1 tablet (81 mg total) by mouth daily. 90 tablet 3   losartan (COZAAR) 50 MG tablet Take 1 tablet (50 mg total) by mouth daily. 90 tablet 3   Omega-3 Fatty Acids (FISH OIL) 1000 MG CAPS Take 2,000 mg by mouth 2 (two) times daily.     rosuvastatin (CRESTOR) 20 MG tablet Take 1 tablet (20 mg total) by mouth daily. 90 tablet 3   vitamin E 200 UNIT capsule Take 200 Units by mouth daily.     No current facility-administered medications for this visit.     Review of Systems    He denies chest pain, palpitations, dyspnea, pnd, orthopnea, n, v, dizziness, syncope, edema, weight gain, or early satiety.  All other systems reviewed and are otherwise negative except as noted above.    Physical Exam    VS:  BP 128/72 (BP Location: Left Arm, Patient Position: Sitting, Cuff Size: Large)   Pulse 65   Ht 5\' 9"  (1.753 m)   Wt 228 lb 6.4 oz (103.6 kg)   SpO2 98%   BMI 33.73 kg/m  , BMI Body mass index is 33.73 kg/m.     GEN: Well nourished, well developed, in no acute distress. HEENT: normal. Neck: Supple, no JVD, carotid bruits, or masses. Cardiac: RRR, no murmurs, rubs, or gallops. No clubbing, cyanosis, edema.  Radials 2+/PT 2+ and equal bilaterally.  Respiratory:  Respirations regular and unlabored, clear to auscultation bilaterally. GI: Soft, nontender, nondistended, BS + x 4. MS: no deformity or atrophy. Skin: warm and dry, no rash. Neuro:  Strength and sensation are intact. Psych: Normal affect.  Accessory Clinical Findings    Lab Results  Component Value Date   WBC 5.6 11/07/2022   HGB 15.3  11/07/2022   HCT 43.7 11/07/2022   MCV 86.4 11/07/2022   PLT 182 11/07/2022   Lab Results  Component Value Date   CREATININE 1.19 11/07/2022   BUN 18 11/07/2022   NA 139 11/07/2022   K 4.5 11/07/2022   CL 105 11/07/2022   CO2 31 11/07/2022   Lab Results  Component Value Date   ALT 17 11/07/2022   AST 20 11/07/2022   ALKPHOS 64 11/07/2022   BILITOT 0.7 11/07/2022   Lab Results  Component Value Date   CHOL 244 (H) 04/27/2022   HDL 51 04/27/2022   LDLCALC 180 (H) 04/27/2022   LDLDIRECT 176 (H) 04/27/2022   TRIG 65 04/27/2022   CHOLHDL 4.8 04/27/2022    Lab Results  Component Value Date   HGBA1C  5.6 04/25/2021    Assessment & Plan    1.  Precordial chest pain/nonobstructive CAD: Catheterization 2019, with mild to moderate nonobstructive CAD.  At February 14 office visit, he reported 2 episodes of chest discomfort and he subsequently underwent coronary CT angiogram, which again showed mild to moderate nonobstructive CAD with a 50-69% ostial stenosis of left circumflex and normal CT FFR.  He has had no recurrent chest pain.  Blood pressure is improved following titration of losartan.  He remains on aspirin and statin therapy, the latter of which he only recently started taking.  Will arrange for follow-up lipids and LFTs in the next few weeks.  2.  Essential hypertension: Blood pressure improved after initiation of losartan.  He had lab work on March 13 that showed stable renal function and electrolytes.  3.  Hyperlipidemia: LDL of 180 in September 2023.  He has been taking rosuvastatin 20 mg daily since his last visit.  Placing orders today for fasting lipids and LFTs and he will come back on another day to have this done.  4.  Chronic HFpEF: Euvolemic.  Blood pressure and heart rate stable.  Continue losartan therapy.  5.  Palpitations: Quiescent.  6.  Disposition: Follow-up lipids and LFTs.  Follow-up in clinic in 6 months or sooner if necessary.   Murray Hodgkins,  NP 11/21/2022, 10:49 AM

## 2022-11-29 ENCOUNTER — Other Ambulatory Visit
Admission: RE | Admit: 2022-11-29 | Discharge: 2022-11-29 | Disposition: A | Discharge status: Home / Self Care | Payer: BC Managed Care – PPO | Attending: Nurse Practitioner | Admitting: Nurse Practitioner

## 2022-11-29 DIAGNOSIS — E785 Hyperlipidemia, unspecified: Secondary | ICD-10-CM | POA: Diagnosis not present

## 2022-11-29 DIAGNOSIS — I2510 Atherosclerotic heart disease of native coronary artery without angina pectoris: Secondary | ICD-10-CM | POA: Insufficient documentation

## 2022-11-29 LAB — LIPID PANEL
Cholesterol: 121 mg/dL (ref 0–200)
HDL: 48 mg/dL (ref >40)
LDL Cholesterol: 53 mg/dL (ref 0–99)
Total CHOL/HDL Ratio: 2.5 RATIO
Triglycerides: 100 mg/dL (ref <150)
VLDL: 20 mg/dL (ref 0–40)

## 2022-11-29 LAB — HEPATIC FUNCTION PANEL
ALT: 25 U/L (ref 0–44)
AST: 28 U/L (ref 15–41)
Albumin: 4.3 g/dL (ref 3.5–5.0)
Alkaline Phosphatase: 60 U/L (ref 38–126)
Bilirubin, Direct: 0.1 mg/dL (ref 0.0–0.2)
Indirect Bilirubin: 1 mg/dL — ABNORMAL HIGH (ref 0.3–0.9)
Total Bilirubin: 1.1 mg/dL (ref 0.3–1.2)
Total Protein: 7.6 g/dL (ref 6.5–8.1)

## 2022-12-03 ENCOUNTER — Ambulatory Visit (INDEPENDENT_AMBULATORY_CARE_PROVIDER_SITE_OTHER): Payer: BC Managed Care – PPO | Admitting: Family

## 2022-12-03 ENCOUNTER — Encounter: Payer: Self-pay | Admitting: Family

## 2022-12-03 VITALS — BP 148/65 | HR 60 | Temp 97.7°F | Resp 16 | Wt 229.0 lb

## 2022-12-03 DIAGNOSIS — E785 Hyperlipidemia, unspecified: Secondary | ICD-10-CM

## 2022-12-03 DIAGNOSIS — Z23 Encounter for immunization: Secondary | ICD-10-CM | POA: Diagnosis not present

## 2022-12-03 DIAGNOSIS — G4733 Obstructive sleep apnea (adult) (pediatric): Secondary | ICD-10-CM

## 2022-12-03 DIAGNOSIS — I5032 Chronic diastolic (congestive) heart failure: Secondary | ICD-10-CM

## 2022-12-03 DIAGNOSIS — C7A012 Malignant carcinoid tumor of the ileum: Secondary | ICD-10-CM

## 2022-12-03 DIAGNOSIS — F17200 Nicotine dependence, unspecified, uncomplicated: Secondary | ICD-10-CM | POA: Diagnosis not present

## 2022-12-03 DIAGNOSIS — H9192 Unspecified hearing loss, left ear: Secondary | ICD-10-CM

## 2022-12-03 DIAGNOSIS — I10 Essential (primary) hypertension: Secondary | ICD-10-CM

## 2022-12-03 NOTE — Assessment & Plan Note (Signed)
Encouraged complete cessation. 

## 2022-12-03 NOTE — Assessment & Plan Note (Signed)
Reports feeling much better since starting cpap.

## 2022-12-03 NOTE — Assessment & Plan Note (Signed)
In remission- managed by oncology.

## 2022-12-03 NOTE — Assessment & Plan Note (Signed)
Pt appears euvolemic today. Continues to follow with cardiology.

## 2022-12-03 NOTE — Assessment & Plan Note (Signed)
Lab Results  Component Value Date   CHOL 121 11/29/2022   HDL 48 11/29/2022   LDLCALC 53 11/29/2022   LDLDIRECT 176 (H) 04/27/2022   TRIG 100 11/29/2022   CHOLHDL 2.5 11/29/2022   At goal on crestor 20mg  once daily

## 2022-12-03 NOTE — Progress Notes (Signed)
Subjective:   By signing my name below, I, Barrett ShellKennedy C Lynn, attest that this documentation has been prepared under the direction and in the presence of Sandford Craze'Sullivan, Melissa, NP. 12/03/2022   Patient ID: Ryan [name]., male    DOB: February 03, 1956, 67 y.o.   MRN: [id number]  Chief Complaint  Patient presents with   Tinnitus    Patient complains of ringing and hard of hearing on left ear    HPI Patient is in today for an office visit.   Blood pressure: His blood pressure is elevated during this visit. He is taking 50 mg losartan daily PO. He reports that his blood pressure at home varies from day to day. Within a week, he typically has a normal bp at around 120/74 and a high bp reading around 140/106.  BP Readings from Last 3 Encounters:  12/03/22 (!) 148/65  11/21/22 128/72  11/14/22 (!) 146/82   Pulse Readings from Last 3 Encounters:  12/03/22 60  11/21/22 65  11/14/22 65    Colon cancer: He has a present history of colon cancer. Last colonoscopy completed on 02/15/2021. A partially obstructing 2 cm submucosal tumor found in the terminal ileum. He had a partial colectomy in 2022. He has not had to complete any chemotherapy.   Social History: He smokes one to two cigars per month.   Immunizations: He is not yet UTD on the current Covid-19 immunization. He is interested in receiving the shingles immunization   C/o hearing loss intermittently in left ear. Has been better the last few days. Also notes occasional tinnitus.    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 10/2019 Echo: EF 60-65%, no rwma, GrII DD, nl RV  fxn. Mildly dil LA.   Fatty liver    fatty liver disease   History of indigestion    History of kidney stones    Hyperlipidemia    Hypertension    Non-obstructive CAD (coronary artery disease)    a. 2016 Neg MV; b. 10/2017 Cath: LM nl, LAD 4167m, D1/2/3 nl, LCX 20p, RCA nl, EF 50-55%; c. 09/2022 Cor CTA: Ca2+ = 180 (68th %'ile). LAD 0-24p, D2 0-24%, LCX 50-69ost (CTFFR normal).   Sleep apnea    C-PAP   SOB (shortness of breath) 2019   after climbing stairs    Past Surgical History:  Procedure Laterality Date   COLONOSCOPY  01/2021   CYSTOSCOPY W/ URETERAL STENT PLACEMENT Right 07/04/2021   Procedure: CYSTOSCOPY WITH RETROGRADE PYELOGRAM/DIAGNOSTIC URETEROSCOPY/URETERAL STENT PLACEMENT;  Surgeon: Crist FatHerrick, Benjamin W, MD;  Location: [facility];  Service: Urology;  Laterality: Right;   CYSTOSCOPY/URETEROSCOPY/HOLMIUM LASER/STENT PLACEMENT Right 07/25/2021   Procedure: CYSTOSCOPY, URETEROSCOPY, PYELOSCOPY, HOLMIUM LASER, STENT EXCHANGE;  Surgeon: Belva AgeeNewsome, George B, MD;  Location: [facility];  Service: Urology;  Laterality: Right;  1 HR   EXTRACORPOREAL SHOCK WAVE LITHOTRIPSY Right 06/26/2021   Procedure: EXTRACORPOREAL SHOCK WAVE LITHOTRIPSY (ESWL);  Surgeon: Crista ElliotBell, Eugene D III, MD;  [facility];  Service: Urology;  Laterality:  Right;   LEFT HEART CATH AND CORONARY ANGIOGRAPHY Left 10/29/2017   Procedure: LEFT HEART CATH AND CORONARY ANGIOGRAPHY;  Surgeon: Yvonne Kendall, MD;  Location: [facility];  Service: Cardiovascular;  Laterality: Left;   SKIN CANCER EXCISION     TONSILLECTOMY  1965   as a child    Family History  Problem Relation Age of Onset   Cancer Mother    Colon cancer Mother        deceased age 10   Diabetes Mother    Hypertension Mother    Cancer Father    Lung cancer Father        deceased age 31   Heart disease Father        Bypass in mid-late 24's   Heart attack Father        46   Sinusitis Sister    Diabetes Sister    Colon polyps  Neg Hx    Esophageal cancer Neg Hx    Stomach cancer Neg Hx    Rectal cancer Neg Hx     Social History   Socioeconomic History   Marital status: Married    Spouse name: Not on file   Number of children: 1   Years of education: Not on file   Highest education level: Not on file  Occupational History   Occupation: Banker: [employer]  Tobacco Use   Smoking status: Light Smoker    Types: Cigars   Smokeless tobacco: Never   Tobacco comments:    Smoked cigarettes and pipe on/off since 20's, now occasional cigar once per month  Vaping Use   Vaping Use: Never used  Substance and Sexual Activity   Alcohol use: Yes    Alcohol/week: 2.0 standard drinks of alcohol    Types: 1 Cans of beer, 1 Standard drinks or equivalent per week    Comment: occasional drink beer   Drug use: No   Sexual activity: Not on file  Other Topics Concern   Not on file  Social History Narrative   Retired Games developer    Social Determinants of Health   Financial Resource Strain: Not on file  Food Insecurity: Not on file  Transportation Needs: Not on file  Physical Activity: Not on file  Stress: Not on file  Social Connections: Not on file  Intimate Partner Violence: Not on file    Outpatient Medications Prior to Visit  Medication Sig Dispense Refill   aspirin EC 81 MG tablet Take 1 tablet (81 mg total) by mouth daily. 90 tablet 3   losartan (COZAAR) 50 MG tablet Take 1 tablet (50 mg total) by mouth daily. 90 tablet 3   Omega-3 Fatty Acids (FISH OIL) 1000 MG CAPS Take 2,000 mg by mouth 2 (two) times daily.     rosuvastatin (CRESTOR) 20 MG tablet Take 1 tablet (20 mg total) by mouth daily. 90 tablet 3   vitamin E 200 UNIT capsule Take 200 Units by mouth daily.     No facility-administered medications prior to visit.    Allergies  Allergen Reactions   Atorvastatin Other (See Comments)    Hands swelled   Tape     Caused redness    ROS See HPI    Objective:     Physical Exam Constitutional:      General: He is not in acute distress.    Appearance: Normal appearance.  HENT:     Head: Normocephalic and atraumatic.  Right Ear: Tympanic membrane, ear canal and external ear normal.     Left Ear: Tympanic membrane, ear canal and external ear normal.  Eyes:     Extraocular Movements: Extraocular movements intact.     Pupils: Pupils are equal, round, and reactive to light.  Cardiovascular:     Rate and Rhythm: Normal rate and regular rhythm.     Heart sounds: Normal heart sounds. No murmur heard.    No gallop.     Comments: Manual bp recheck 148/65 Pulmonary:     Effort: Pulmonary effort is normal. No respiratory distress.     Breath sounds: Normal breath sounds. No wheezing or rales.  Skin:    General: Skin is warm.  Neurological:     Mental Status: He is alert and oriented to person, place, and time.  Psychiatric:        Judgment: Judgment normal.     BP (!) 148/65   Pulse 60   Temp 97.7 F (36.5 C) (Oral)   Resp 16   Wt 229 lb (103.9 kg)   SpO2 100%   BMI 33.82 kg/m  Wt Readings from Last 3 Encounters:  12/03/22 229 lb (103.9 kg)  11/21/22 228 lb 6.4 oz (103.6 kg)  11/14/22 230 lb 4.8 oz (104.5 kg)       Assessment & Plan:  Essential hypertension Assessment & Plan: BP Readings from Last 3 Encounters:  12/03/22 (!) 148/65  11/21/22 128/72  11/14/22 (!) 146/82   Notes some elevated readings at home.  He is currently on losartan 50mg .  BP looked good a few weeks ago.  Will plan to repeat at his follow up visit in 1 month.    Hyperlipidemia LDL goal <70 Assessment & Plan: Lab Results  Component Value Date   CHOL 121 11/29/2022   HDL 48 11/29/2022   LDLCALC 53 11/29/2022   LDLDIRECT 176 (H) 04/27/2022   TRIG 100 11/29/2022   CHOLHDL 2.5 11/29/2022   At goal on crestor 20mg  once daily    Obstructive sleep apnea on CPAP Assessment & Plan: Reports feeling much better since starting cpap.    TOBACCO  ABUSE Assessment & Plan: Encouraged complete cessation.    Chronic heart failure with preserved ejection fraction (HFpEF) Assessment & Plan: Pt appears euvolemic today. Continues to follow with cardiology.    Hearing loss of left ear, unspecified hearing loss type -     Ambulatory referral to Audiology  Malignant carcinoid tumor of ileum Assessment & Plan: In remission- managed by oncology.     Shingrix #1 today. I, Lemont Fillers, NP, personally preformed the services described in this documentation.  All medical record entries made by the scribe were at my direction and in my presence.  I have reviewed the chart and discharge instructions (if applicable) and agree that the record reflects my personal performance and is accurate and complete. 12/03/2022  Lemont Fillers, NP   [facility] as a scribe for Lemont Fillers, NP.,have documented all relevant documentation on the behalf of Lemont Fillers, NP,as directed by  Lemont Fillers, NP while in the presence of Lemont Fillers, NP.

## 2022-12-03 NOTE — Assessment & Plan Note (Addendum)
BP Readings from Last 3 Encounters:  12/03/22 (!) 148/65  11/21/22 128/72  11/14/22 (!) 146/82   Notes some elevated readings at home.  He is currently on losartan 50mg .  BP looked good a few weeks ago.  Will plan to repeat at his follow up visit in 1 month.

## 2022-12-04 NOTE — Addendum Note (Signed)
Addended by: Wilford Corner on: 12/04/2022 09:39 AM   Modules accepted: Orders

## 2022-12-04 NOTE — Progress Notes (Unsigned)
HPI M cigar smoker followed for OSA, complicated by Blind L eye (congenital), CAD/ angina, Diastolic Dysfunction, HTN, Fatty Liver, GERD, Hyperlipidemia, Raynaud's, Tobacco use, Diastolic Dysfunction/ edema, HST- 09/23/19- AHI  54.2/ hr, desaturation to 64%, body weight 232 lbs   ===============================================   12/04/21- 64 yoM cigar smoker followed for OSA, Lung Nodule, Asbestos Exposure,  complicated by Blind L eye (congenital), CAD/ angina, Diastolic Dysfunction, HTN, Fatty Liver, GERD, Hyperlipidemia, Raynaud's, Tobacco use, Diastolic Dysfunction/ edema, Kidney Stone, Small Bowel Malignant Carcinoid/ Partial Colectomy, ,  CPAP auto 10-20/ [employer]    Ordered 09/30/19 Body weight today-222 lbs Download- compliance 100%, AHI 3.1/ hr Covid vax-3 Phizer Flu - no Download reviewed. Comfortable with CPAP. Lung nodule benign by CT. ASCVD/CAD on CT. 1 episode palpitation. Has cardiologist. CT chest 11/27/21-  IMPRESSION: 1. Slight decreased size of right lower lobe pulmonary nodule, considered definitively benign requiring no future imaging follow-up. 2. Aortic atherosclerosis, in addition to left main and three-vessel coronary artery disease. Please note that although the presence of coronary artery calcium documents the presence of coronary artery disease, the severity of this disease and any potential stenosis cannot be assessed on this non-gated CT examination. Assessment for potential risk factor modification, dietary therapy or pharmacologic therapy may be warranted, if clinically indicated. Aortic Atherosclerosis (ICD10-I70.0).  12/06/22-  66 yoM cigar smoker followed for OSA, Lung Nodule, Asbestos Exposure,  complicated by Blind L eye (congenital), CAD/ angina, Diastolic Dysfunction, HTN, Fatty Liver, GERD, Hyperlipidemia, Raynaud's, Tobacco use, Diastolic Dysfunction/ edema, Kidney Stone, Small Bowel Malignant Carcinoid/ Partial Colectomy, ,  CPAP auto 10-20/ [employer]     Ordered 09/30/19 Body weight today- Download- compliance     ROS-see HPI   + = positive Constitutional:    weight loss, night sweats, fevers, chills, +fatigue, lassitude. HEENT:    headaches, difficulty swallowing, tooth/dental problems, sore throat,       sneezing, itching, ear ache, nasal congestion, post nasal drip, snoring CV:    chest pain, orthopnea, PND, +swelling in lower extremities, anasarca,                                   dizziness,+ palpitations Resp:   shortness of breath with exertion or at rest.                productive cough,   non-productive cough, coughing up of blood.              change in color of mucus.  wheezing.   Skin:    rash or lesions. GI:  No-   heartburn, indigestion, abdominal pain, nausea, vomiting, diarrhea,                 change in bowel habits, loss of appetite GU: dysuria, change in color of urine, no urgency or frequency.   flank pain. MS:   joint pain, stiffness, decreased range of motion, back pain. Neuro-     nothing unusual Psych:  change in mood or affect.  depression or anxiety.   memory loss.  OBJ- Physical Exam General- Alert, Oriented, Affect-appropriate, Distress- none acute Skin- rash-none, lesions- none, excoriation- none Lymphadenopathy- none Head- atraumatic            Eyes- + L strabismus, +blind L eye,             Ears- Hearing, canals-normal            Nose- Clear, no-Septal  dev, mucus, polyps, erosion, perforation             Throat- Mallampati IV , mucosa clear , drainage- none, tonsils- atrophic Neck- flexible , trachea midline, no stridor , thyroid nl, carotid no bruit Chest - symmetrical excursion , unlabored           Heart/CV- RRR , no murmur , no gallop  , no rub, nl s1 s2                           - JVD- none , edema +1, stasis changes- none, varices- none           Lung- clear to P&A, wheeze- none, cough- none , dullness-none, rub- none           Chest wall-  Abd-  Br/ Gen/ Rectal- Not done, not  indicated Extrem- cyanosis- none, clubbing, none, atrophy- none, strength- nl Neuro- grossly intact to observation

## 2022-12-06 ENCOUNTER — Encounter: Payer: Self-pay | Admitting: Internal Medicine

## 2022-12-06 ENCOUNTER — Ambulatory Visit: Payer: BC Managed Care – PPO | Admitting: Internal Medicine

## 2022-12-06 VITALS — BP 124/62 | HR 59 | Ht 69.0 in | Wt 228.2 lb

## 2022-12-06 DIAGNOSIS — R911 Solitary pulmonary nodule: Secondary | ICD-10-CM | POA: Diagnosis not present

## 2022-12-06 DIAGNOSIS — G4733 Obstructive sleep apnea (adult) (pediatric): Secondary | ICD-10-CM

## 2022-12-06 DIAGNOSIS — I2510 Atherosclerotic heart disease of native coronary artery without angina pectoris: Secondary | ICD-10-CM | POA: Diagnosis not present

## 2022-12-06 NOTE — Assessment & Plan Note (Signed)
Old scarring interpreted as evidence of old granulomatous disease with no active process identified.  Complete smoking cessation is encouraged.

## 2022-12-06 NOTE — Assessment & Plan Note (Signed)
CPAP with good compliance and control. Plan-continue auto 10-20

## 2022-12-06 NOTE — Patient Instructions (Signed)
You are doing well with your CPAP  We can continue auto 10-20

## 2022-12-06 NOTE — Assessment & Plan Note (Signed)
He denies recent changes.  Continues following with cardiology.

## 2022-12-12 ENCOUNTER — Ambulatory Visit: Payer: BC Managed Care – PPO | Admitting: Audiologist

## 2022-12-21 ENCOUNTER — Ambulatory Visit: Payer: BC Managed Care – PPO | Admitting: Family

## 2023-01-01 DIAGNOSIS — G4733 Obstructive sleep apnea (adult) (pediatric): Secondary | ICD-10-CM | POA: Diagnosis not present

## 2023-01-07 ENCOUNTER — Ambulatory Visit (INDEPENDENT_AMBULATORY_CARE_PROVIDER_SITE_OTHER): Payer: BC Managed Care – PPO | Admitting: Family

## 2023-01-07 ENCOUNTER — Encounter: Payer: Self-pay | Admitting: Family

## 2023-01-07 VITALS — BP 139/67 | HR 59 | Temp 97.6°F | Resp 16 | Ht 69.0 in | Wt 234.0 lb

## 2023-01-07 DIAGNOSIS — Z23 Encounter for immunization: Secondary | ICD-10-CM

## 2023-01-07 DIAGNOSIS — Z125 Encounter for screening for malignant neoplasm of prostate: Secondary | ICD-10-CM

## 2023-01-07 DIAGNOSIS — F17200 Nicotine dependence, unspecified, uncomplicated: Secondary | ICD-10-CM

## 2023-01-07 DIAGNOSIS — I10 Essential (primary) hypertension: Secondary | ICD-10-CM

## 2023-01-07 DIAGNOSIS — Z0000 Encounter for general adult medical examination without abnormal findings: Secondary | ICD-10-CM | POA: Diagnosis not present

## 2023-01-07 LAB — BASIC METABOLIC PANEL
BUN: 18 mg/dL (ref 6–23)
CO2: 29 mEq/L (ref 19–32)
Calcium: 9.3 mg/dL (ref 8.4–10.5)
Chloride: 102 mEq/L (ref 96–112)
Creatinine, Ser: 1.16 mg/dL (ref 0.40–1.50)
GFR: 66.37 mL/min (ref >60.00)
Glucose, Bld: 96 mg/dL (ref 70–99)
Potassium: 4.5 mEq/L (ref 3.5–5.1)
Sodium: 138 mEq/L (ref 135–145)

## 2023-01-07 LAB — PSA: PSA: 1.28 ng/mL (ref 0.10–4.00)

## 2023-01-07 NOTE — Assessment & Plan Note (Addendum)
Reports diet is overall good. Colo up to date.   Wt Readings from Last 3 Encounters:  01/07/23 234 lb (106.1 kg)  12/06/22 228 lb 3.2 oz (103.5 kg)  12/03/22 229 lb (103.9 kg)  Dental up to date. Recommend that he update vision exam.  Td today. Shingrix will be due in another month.  He will be on Medicare then so we will have to send an rx to his pharmacy at that time. Discussed healthy diet, exercise, weight loss.

## 2023-01-07 NOTE — Assessment & Plan Note (Signed)
Encouraged complete cessation. 

## 2023-01-07 NOTE — Progress Notes (Signed)
Subjective:   By signing my name below, I, Shehryar Baig, attest that this documentation has been prepared under the direction and in the presence of Sandford Craze, NP. 01/07/2023   Patient ID: Ryan [name]., male    DOB: 08/11/1956, 67 y.o.   MRN: [id number]  Chief Complaint  Patient presents with   Annual Exam    HPI Patient is in today for a comprehensive physical exam.   Blood pressure: His blood pressure is doing well during this visit. He measured his blood pressure Friday and reports it was elevated.  BP Readings from Last 3 Encounters:  01/07/23 139/67  12/06/22 124/62  12/03/22 (!) 148/65   Pulse Readings from Last 3 Encounters:  01/07/23 (!) 59  12/06/22 (!) 59  12/03/22 60   Acute: She denies fever, unexpected weight change, adenopathy, new moles, sinus pain, sore throat, visual disturbance, chest pain, palpitations, leg swelling, cough, shortness of breath, wheezing, nausea, vomiting, diarrhea, constipation, blood in stool, dysuria, frequency, hematuria, new muscle pain, new joint pain, headaches, depression or anxiety at this time.   Social history: He has no new surgical procedures to report. His brother is having an upcomming triple bypass procedure, his maternal grandfather passed away at age 21 from lung cancer, his maternal grandmother passed away from natural causes, hit paternal grandmother passed away from natural causes, otherwise he has no changes to his family medical history. He continues occasionally smoking cigars.   Immunizations: He is due for tetanus vaccine and is interested in receiving it at his pharmacy. He is due for shingles vaccines. He is not interested in HIV screening.   Diet: He is mostly managing a heathy diet. He is decreasing the amount of red meats he eats.   Exercise: He is participating in regular exercise by gardening and walking.   Colonoscopy: Last completed 02/15/2021. Results showed:  - Partially obstructing 2 cm  submucosal tumor in the terminal ileum. Biopsied. Most likely carcinoid tumor. - The examination was otherwise normal on direct and retroflexion views. Repeat in 5 years.   PSA: Last completed 09/30/2020. Results showed 2.32.   Dental: He is UTD on dental care.   Vision: He is due for vision care. His last appointment was 4 years ago.    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 10/2019 Echo: EF 60-65%, no rwma, GrII DD, nl RV fxn. Mildly dil LA.   Fatty liver    fatty liver disease   History of indigestion    History of kidney stones    Hyperlipidemia    Hypertension    Non-obstructive CAD (coronary artery disease)    a. 2016 Neg MV; b. 10/2017 Cath: LM nl, LAD 70m, D1/2/3 nl, LCX 20p, RCA nl, EF 50-55%; c. 09/2022 Cor CTA: Ca2+ = 180 (68th %'ile). LAD 0-24p, D2 0-24%, LCX 50-69ost (CTFFR normal).   Sleep apnea    C-PAP   SOB (shortness of breath) 2019   after climbing stairs    Past  Surgical History:  Procedure Laterality Date   COLONOSCOPY  01/2021   CYSTOSCOPY W/ URETERAL STENT PLACEMENT Right 07/04/2021   Procedure: CYSTOSCOPY WITH RETROGRADE PYELOGRAM/DIAGNOSTIC URETEROSCOPY/URETERAL STENT PLACEMENT;  Surgeon: Crist Fat, MD;  Location: [facility];  Service: Urology;  Laterality: Right;   CYSTOSCOPY/URETEROSCOPY/HOLMIUM LASER/STENT PLACEMENT Right 07/25/2021   Procedure: CYSTOSCOPY, URETEROSCOPY, PYELOSCOPY, HOLMIUM LASER, STENT EXCHANGE;  Surgeon: Belva Agee, MD;  Location: [facility];  Service: Urology;  Laterality: Right;  1 HR   EXTRACORPOREAL SHOCK WAVE LITHOTRIPSY Right 06/26/2021    Procedure: EXTRACORPOREAL SHOCK WAVE LITHOTRIPSY (ESWL);  Surgeon: Crista Elliot, MD;  Location: [facility];  Service: Urology;  Laterality: Right;   LEFT HEART CATH AND CORONARY ANGIOGRAPHY Left 10/29/2017   Procedure: LEFT HEART CATH AND CORONARY ANGIOGRAPHY;  Surgeon: Yvonne Kendall, MD;  Location: [facility];  Service: Cardiovascular;  Laterality: Left;   SKIN CANCER EXCISION     TONSILLECTOMY  1965   as a child    Family History  Problem Relation Age of Onset   Cancer Mother    Colon cancer Mother        deceased age 12   Diabetes Mother    Hypertension Mother    Cancer Father    Lung cancer Father        deceased age 93   Heart disease Father        Bypass in mid-late 64's   Heart attack Father        68   Sinusitis Sister    Diabetes Sister    CAD Brother    Lung cancer Maternal Grandfather    Colon polyps Neg Hx    Esophageal cancer Neg Hx    Stomach cancer Neg Hx    Rectal cancer Neg Hx     Social History   Socioeconomic History   Marital status: Married    Spouse name: Not on file   Number of children: 1   Years of education: Not on file   Highest education level: Not on file  Occupational History   Occupation: Banker: [employer]  Tobacco Use   Smoking status: Light Smoker    Types: Cigars    Passive exposure: Current   Smokeless tobacco: Never   Tobacco comments:    Smokes 1-2 cigars per month PAP 12/06/22  Vaping Use   Vaping Use: Never used  Substance and Sexual Activity   Alcohol use: Yes    Alcohol/week: 2.0 standard drinks of alcohol    Types: 1 Cans of beer, 1 Standard drinks or equivalent per week    Comment: occasional drink beer   Drug use: No   Sexual activity: Not on file  Other Topics Concern   Not on file  Social History Narrative   Retired Games developer    Social Determinants of Health   Financial Resource Strain: Not on file  Food Insecurity: Not on file   Transportation Needs: Not on file  Physical Activity: Not on file  Stress: Not on file  Social Connections: Not on file  Intimate Partner Violence: Not on file    Outpatient Medications Prior to Visit  Medication Sig Dispense Refill   aspirin EC 81 MG tablet Take 1 tablet (81 mg total) by mouth daily. 90 tablet 3   losartan (COZAAR) 50 MG tablet Take 1 tablet (50 mg total) by mouth daily. 90 tablet 3   Omega-3 Fatty Acids (FISH OIL) 1000  MG CAPS Take 2,000 mg by mouth 2 (two) times daily.     rosuvastatin (CRESTOR) 20 MG tablet Take 1 tablet (20 mg total) by mouth daily. 90 tablet 3   vitamin E 200 UNIT capsule Take 200 Units by mouth daily.     No facility-administered medications prior to visit.    Allergies  Allergen Reactions   Atorvastatin Other (See Comments)    Hands swelled   Tape     Caused redness    Review of Systems  Constitutional:  Negative for fever.       (-)unexpected weight change (-)Adenopathy  HENT:  Negative for congestion, sinus pain and sore throat.   Eyes:        (-)Visual disturbance  Respiratory:  Negative for cough, shortness of breath and wheezing.   Cardiovascular:  Negative for chest pain, palpitations and leg swelling.  Gastrointestinal:  Negative for blood in stool, constipation, diarrhea, nausea and vomiting.  Genitourinary:  Negative for dysuria, frequency and hematuria.  Musculoskeletal:        (-)new muscle pain (-)new joint pain  Skin:        (-)new moles  Neurological:  Negative for dizziness and headaches.  Psychiatric/Behavioral:  Negative for depression. The patient is not nervous/anxious.        Objective:    Physical Exam Constitutional:      General: He is not in acute distress.    Appearance: Normal appearance. He is not ill-appearing.  HENT:     Head: Normocephalic and atraumatic.     Right Ear: Tympanic membrane, ear canal and external ear normal.     Left Ear: Tympanic membrane, ear canal and external ear  normal.  Eyes:     Extraocular Movements: Extraocular movements intact.     Right eye: Normal extraocular motion and no nystagmus.     Left eye: Normal extraocular motion and no nystagmus.     Pupils: Pupils are equal, round, and reactive to light.  Cardiovascular:     Rate and Rhythm: Normal rate and regular rhythm.     Heart sounds: Normal heart sounds. No murmur heard.    No gallop.  Pulmonary:     Effort: Pulmonary effort is normal. No respiratory distress.     Breath sounds: Normal breath sounds. No wheezing or rales.  Abdominal:     General: Bowel sounds are normal. There is no distension.     Palpations: Abdomen is soft.     Tenderness: There is no abdominal tenderness. There is no guarding.  Musculoskeletal:     Comments: 5/5 strength in both upper and lower extremities  Skin:    General: Skin is warm and dry.  Neurological:     Mental Status: He is alert and oriented to person, place, and time.     Deep Tendon Reflexes:     Reflex Scores:      Patellar reflexes are 2+ on the right side and 2+ on the left side. Psychiatric:        Judgment: Judgment normal.     BP 139/67 (BP Location: Right Arm, Patient Position: Sitting, Cuff Size: Large)   Pulse (!) 59   Temp 97.6 F (36.4 C) (Oral)   Resp 16   Ht 5\' 9"  (1.753 m)   Wt 234 lb (106.1 kg)   SpO2 100%   BMI 34.56 kg/m  Wt Readings from Last 3 Encounters:  01/07/23 234 lb (106.1 kg)  12/06/22 228 lb 3.2 oz (103.5 kg)  12/03/22 229 lb (103.9 kg)       Assessment & Plan:  Screening for prostate cancer -     PSA  Annual physical exam Assessment & Plan: Reports diet is overall good. Colo up to date.   Wt Readings from Last 3 Encounters:  01/07/23 234 lb (106.1 kg)  12/06/22 228 lb 3.2 oz (103.5 kg)  12/03/22 229 lb (103.9 kg)  Dental up to date. Recommend that he update vision exam.  Td today. Shingrix will be due in another month.  He will be on Medicare then so we will have to send an rx to his pharmacy  at that time. Discussed healthy diet, exercise, weight loss.    Essential hypertension -     Basic metabolic panel  Need for Td vaccine -     Td vaccine greater than or equal to 7yo preservative free IM  TOBACCO ABUSE Assessment & Plan: Encouraged complete cessation.      I, Lemont Fillers, NP, personally preformed the services described in this documentation.  All medical record entries made by the scribe were at my direction and in my presence.  I have reviewed the chart and discharge instructions (if applicable) and agree that the record reflects my personal performance and is accurate and complete. 01/07/2023   I,Shehryar Baig,acting as a scribe for Lemont Fillers, NP.,have documented all relevant documentation on the behalf of Lemont Fillers, NP,as directed by  Lemont Fillers, NP while in the presence of Lemont Fillers, NP.   Lemont Fillers, NP

## 2023-04-01 ENCOUNTER — Other Ambulatory Visit: Payer: BC Managed Care – PPO

## 2023-04-03 ENCOUNTER — Other Ambulatory Visit: Payer: Self-pay

## 2023-04-03 ENCOUNTER — Inpatient Hospital Stay: Payer: Medicare Other | Attending: Hematology

## 2023-04-03 ENCOUNTER — Encounter (HOSPITAL_COMMUNITY): Payer: Self-pay

## 2023-04-03 ENCOUNTER — Ambulatory Visit (HOSPITAL_COMMUNITY)
Admission: RE | Admit: 2023-04-03 | Discharge: 2023-04-03 | Disposition: A | Discharge status: Home / Self Care | Payer: Medicare Other | Source: Ambulatory Visit | Attending: Hematology

## 2023-04-03 DIAGNOSIS — C7A012 Malignant carcinoid tumor of the ileum: Secondary | ICD-10-CM | POA: Insufficient documentation

## 2023-04-03 DIAGNOSIS — C7A8 Other malignant neuroendocrine tumors: Secondary | ICD-10-CM | POA: Insufficient documentation

## 2023-04-03 DIAGNOSIS — D3A012 Benign carcinoid tumor of the ileum: Secondary | ICD-10-CM

## 2023-04-03 LAB — CBC WITH DIFFERENTIAL (CANCER CENTER ONLY)
Abs Immature Granulocytes: 0.02 10*3/uL (ref 0.00–0.07)
Basophils Absolute: 0 10*3/uL (ref 0.0–0.1)
Basophils Relative: 1 %
Eosinophils Absolute: 0.2 10*3/uL (ref 0.0–0.5)
Eosinophils Relative: 3 %
HCT: 39.8 % (ref 39.0–52.0)
Hemoglobin: 14.2 g/dL (ref 13.0–17.0)
Immature Granulocytes: 0 %
Lymphocytes Relative: 25 %
Lymphs Abs: 1.5 10*3/uL (ref 0.7–4.0)
MCH: 30.7 pg (ref 26.0–34.0)
MCHC: 35.7 g/dL (ref 30.0–36.0)
MCV: 86 fL (ref 80.0–100.0)
Monocytes Absolute: 0.5 10*3/uL (ref 0.1–1.0)
Monocytes Relative: 9 %
Neutro Abs: 3.6 10*3/uL (ref 1.7–7.7)
Neutrophils Relative %: 62 %
Platelet Count: 173 10*3/uL (ref 150–400)
RBC: 4.63 MIL/uL (ref 4.22–5.81)
RDW: 11.5 % (ref 11.5–15.5)
WBC Count: 5.9 10*3/uL (ref 4.0–10.5)
nRBC: 0 % (ref 0.0–0.2)

## 2023-04-03 LAB — CMP (CANCER CENTER ONLY)
ALT: 16 U/L (ref 0–44)
AST: 21 U/L (ref 15–41)
Albumin: 4.4 g/dL (ref 3.5–5.0)
Alkaline Phosphatase: 62 U/L (ref 38–126)
Anion gap: 5 (ref 5–15)
BUN: 20 mg/dL (ref 8–23)
CO2: 28 mmol/L (ref 22–32)
Calcium: 9.4 mg/dL (ref 8.9–10.3)
Chloride: 106 mmol/L (ref 98–111)
Creatinine: 1.04 mg/dL (ref 0.61–1.24)
GFR, Estimated: >60 mL/min (ref >60)
Glucose, Bld: 98 mg/dL (ref 70–99)
Potassium: 4.5 mmol/L (ref 3.5–5.1)
Sodium: 139 mmol/L (ref 135–145)
Total Bilirubin: 0.6 mg/dL (ref 0.3–1.2)
Total Protein: 7.2 g/dL (ref 6.5–8.1)

## 2023-04-03 MED ORDER — SODIUM CHLORIDE (PF) 0.9 % IJ SOLN
INTRAMUSCULAR | Status: AC
  Filled 2023-04-03: qty 50

## 2023-04-03 MED ORDER — IOHEXOL 300 MG/ML  SOLN
100.0000 mL | Freq: Once | INTRAMUSCULAR | Status: AC | PRN
  Administered 2023-04-03: 100 mL via INTRAVENOUS

## 2023-04-04 ENCOUNTER — Inpatient Hospital Stay: Payer: Medicare Other | Admitting: Hematology

## 2023-04-04 ENCOUNTER — Telehealth: Payer: Self-pay | Admitting: Hematology

## 2023-04-09 ENCOUNTER — Telehealth: Payer: Self-pay | Admitting: Internal Medicine

## 2023-04-09 NOTE — Telephone Encounter (Signed)
*  STAT* If patient is at the pharmacy, call can be transferred to refill team.   1. Which medications need to be refilled? (please list name of each medication and dose if known) rosuvastatin (CRESTOR) 20 MG tablet  2. Which pharmacy/location (including street and city if local pharmacy) is medication to be sent to? [facility]  3. Do they need a 30 day or 90 day supply?  90 day supply

## 2023-04-10 ENCOUNTER — Other Ambulatory Visit: Payer: Self-pay

## 2023-04-10 ENCOUNTER — Inpatient Hospital Stay (HOSPITAL_BASED_OUTPATIENT_CLINIC_OR_DEPARTMENT_OTHER): Payer: Medicare Other | Admitting: Hematology

## 2023-04-10 VITALS — BP 139/83 | HR 70 | Temp 98.2°F | Resp 16 | Ht 69.0 in | Wt 230.6 lb

## 2023-04-10 DIAGNOSIS — C7A012 Malignant carcinoid tumor of the ileum: Secondary | ICD-10-CM

## 2023-04-10 DIAGNOSIS — C7A8 Other malignant neuroendocrine tumors: Secondary | ICD-10-CM | POA: Diagnosis not present

## 2023-04-10 MED ORDER — ROSUVASTATIN CALCIUM 20 MG PO TABS: 20.0000 mg | ORAL_TABLET | Freq: Every day | ORAL | 0 refills | Status: DC

## 2023-04-10 NOTE — Assessment & Plan Note (Signed)
grade 1, pT2N1, stage III, Ki-67 < 1% -presented with hematuria. CT AP 12/02/20 showed incidental 1.9 cm ileum/small bowel mass. CT chest 01/02/21 negative for metastatic disease.  -colonoscopy on 02/15/21 under Dr. Marina Goodell, path showed low grade neuroendocrine tumor.   -s/p partial colectomy by Dr. Michaell Cowing on 05/10/21, path showed well differentiated 2.2 cm NET in the terminal ileum invading the muscularis propria, clear margins, 2/12 positive lymph nodes. Staged pT2pN1 -Baseline chromogranin A and 24hr urine from 06/22/21 were normal -surveillance DOTATATE PET on 04/12/22 showed NED. -Surveillance CT scan of chest, abdomen pelvis showed no evidence of recurrence, he has a few 4 to 5 mm lung nodules, likely benign.  Will continue monitoring.

## 2023-04-10 NOTE — Progress Notes (Signed)
[facility]   [phone] [phone]   Clinic Follow up Note   Patient Care Team: Sandford Craze, NP as PCP - General (Internal Medicine) End, Cristal Deer, MD as PCP - Cardiology (Cardiology) Waymon Budge, MD as Consulting Physician (Pulmonary Disease) Karie Soda, MD as Consulting Physician (General Surgery) Hilarie Fredrickson, MD as Consulting Physician (Gastroenterology) Belva Agee, MD as Consulting Physician (Urology) Pollyann Samples, NP as Nurse Practitioner (Medical Oncology)  Date of Service:  04/10/2023  CHIEF COMPLAINT: f/u of  neuroendocrine tumor of the ileum     CURRENT THERAPY:  Surveillance   ASSESSMENT:  Ryan Debaun. is a 67 y.o. 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 CT AP 12/02/20 showed incidental 1.9 cm ileum/small bowel mass. CT chest 01/02/21 negative for metastatic disease.  -colonoscopy on 02/15/21 under Dr. Marina Goodell, path showed low grade neuroendocrine tumor.   -s/p partial colectomy by Dr. Michaell Cowing on 05/10/21, path showed well differentiated 2.2 cm NET in the terminal ileum invading the muscularis propria, clear margins, 2/12 positive lymph nodes. Staged pT2pN1 -Baseline chromogranin A and 24hr urine from 06/22/21 were normal -surveillance DOTATATE PET on 04/12/22 showed NED. -Surveillance CT scan of chest, abdomen pelvis showed no evidence of recurrence, he has a few 4 to 5 mm lung nodules, likely benign.  Will continue monitoring. -he is clinically doing well, f/u in 6 months and next scan in a year   PLAN: - I reviewed CT scan with pt- no evidence of reoccurrence. -lab reviewed -Tumor marker-normal -continue cancer surveillance -lab and f/u in 6 months    INTERVAL HISTORY:  Ryan [name]. is here for a follow up of  neuroendocrine tumor of the ileum . He was last seen by me on 11/14/2022. He presents to  the clinic accompanied by wife.  Pt state that he had a burning sensation in his left leg and it went away.    All other systems were reviewed with the patient and are negative.  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 10/2019 Echo: EF 60-65%, no rwma, GrII DD, nl RV fxn. Mildly dil LA.   Fatty liver    fatty liver disease   History of indigestion    History of kidney stones    Hyperlipidemia    Hypertension    Non-obstructive CAD (coronary artery disease)    a. 2016 Neg MV; b. 10/2017 Cath: LM nl, LAD 67m, D1/2/3 nl, LCX 20p, RCA nl, EF 50-55%; c. 09/2022 Cor CTA: Ca2+ = 180 (68th %'ile). LAD 0-24p, D2 0-24%, LCX 50-69ost (CTFFR normal).   Sleep apnea    C-PAP   SOB (shortness of breath) 2019   after climbing stairs    SURGICAL HISTORY: Past Surgical History:  Procedure Laterality Date   COLONOSCOPY  01/2021   CYSTOSCOPY W/ URETERAL STENT PLACEMENT Right 07/04/2021   Procedure:  CYSTOSCOPY WITH RETROGRADE PYELOGRAM/DIAGNOSTIC URETEROSCOPY/URETERAL STENT PLACEMENT;  Surgeon: Crist Fat, MD;  Location: [facility];  Service: Urology;  Laterality: Right;   CYSTOSCOPY/URETEROSCOPY/HOLMIUM LASER/STENT PLACEMENT Right 07/25/2021   Procedure: CYSTOSCOPY, URETEROSCOPY, PYELOSCOPY, HOLMIUM LASER, STENT EXCHANGE;  Surgeon: Belva Agee, MD;  Location: [facility];  Service: Urology;  Laterality: Right;  1 HR   EXTRACORPOREAL SHOCK WAVE LITHOTRIPSY Right 06/26/2021   Procedure: EXTRACORPOREAL SHOCK WAVE LITHOTRIPSY (ESWL);  Surgeon: Crista Elliot, MD;  Location: [facility];  Service: Urology;  Laterality: Right;   LEFT HEART CATH AND CORONARY ANGIOGRAPHY Left 10/29/2017   Procedure: LEFT HEART CATH AND CORONARY ANGIOGRAPHY;  Surgeon: Yvonne Kendall, MD;  Location: [facility];  Service: Cardiovascular;  Laterality: Left;   SKIN CANCER EXCISION     TONSILLECTOMY  1965   as a child    I have reviewed the social history and family history with the patient and they are unchanged from previous note.  ALLERGIES:  is allergic to atorvastatin and tape.  MEDICATIONS:  Current Outpatient Medications  Medication Sig Dispense Refill   aspirin EC 81 MG tablet Take 1 tablet (81 mg total) by mouth daily. 90 tablet 3   losartan (COZAAR) 50 MG tablet Take 1 tablet (50 mg total) by mouth daily. 90 tablet 3   Omega-3 Fatty Acids (FISH OIL) 1000 MG CAPS Take 2,000 mg by mouth 2 (two) times daily.     rosuvastatin (CRESTOR) 20 MG tablet Take 1 tablet (20 mg total) by mouth daily. 90 tablet 0   vitamin E 200 UNIT capsule Take 200 Units by mouth daily.     No current facility-administered medications for this visit.    PHYSICAL EXAMINATION: ECOG PERFORMANCE STATUS: 0 - Asymptomatic  Vitals:   04/10/23 1021  BP: 139/83  Pulse: 70  Resp: 16  Temp: 98.2 F (36.8 C)  SpO2: 97%   Wt Readings from Last 3 Encounters:  04/10/23 230 lb 9.6 oz (104.6 kg)  01/07/23 234 lb (106.1 kg)  12/06/22 228 lb 3.2 oz (103.5 kg)     GENERAL:alert, no distress and comfortable SKIN: skin color normal, no rashes or significant lesions EYES: normal, Conjunctiva are pink and non-injected, sclera clear  NEURO: alert & oriented x 3 with fluent speech LABORATORY DATA:  I have reviewed the data as listed    Latest Ref Rng & Units 04/03/2023   12:28 PM 11/07/2022   10:10 AM 06/04/2022   10:07 AM  CBC  WBC 4.0 - 10.5 K/uL 5.9  5.6  5.2   Hemoglobin 13.0 - 17.0 g/dL 56.2  13.0  86.5   Hematocrit 39.0 - 52.0 % 39.8  43.7  41.4   Platelets 150 - 400 K/uL 173  182  185          Latest Ref Rng & Units 04/03/2023   12:28 PM 01/07/2023    9:56 AM 11/29/2022   10:25 AM  CMP  Glucose 70 - 99 mg/dL 98  96    BUN 8 - 23 mg/dL 20  18    Creatinine 7.84 - 1.24 mg/dL 6.96  2.95    Sodium 284 - 145 mmol/L 139  138    Potassium 3.5 - 5.1 mmol/L 4.5  4.5    Chloride 98 - 111 mmol/L 106  102    CO2 22 - 32 mmol/L 28  29    Calcium 8.9 - 10.3 mg/dL 9.4  9.3    Total Protein 6.5 -  8.1 g/dL 7.2   7.6   Total Bilirubin 0.3 - 1.2 mg/dL 0.6   1.1   Alkaline Phos 38 - 126 U/L 62   60   AST 15 - 41 U/L 21   28   ALT 0 - 44 U/L 16   25       RADIOGRAPHIC STUDIES: I have personally reviewed the radiological images as listed and agreed with the findings in the report. No results found.    No orders of the defined types were placed in this encounter.  All questions were answered. The patient knows to call the clinic with any problems, questions or concerns. No barriers to learning was detected. The total time spent in the appointment was 25 minutes.     Malachy Mood, MD 04/10/2023   Carolin Coy, CMA, am acting as scribe for Malachy Mood, MD.   I have reviewed the above documentation for accuracy and completeness, and I agree with the above.

## 2023-04-10 NOTE — Telephone Encounter (Signed)
Requested Prescriptions   Signed Prescriptions Disp Refills   rosuvastatin (CRESTOR) 20 MG tablet 90 tablet 0    Sig: Take 1 tablet (20 mg total) by mouth daily.    Authorizing Provider: END, CHRISTOPHER    Ordering User: Kendrick Fries

## 2023-04-26 ENCOUNTER — Telehealth: Payer: Self-pay | Admitting: Internal Medicine

## 2023-04-26 ENCOUNTER — Other Ambulatory Visit: Payer: Self-pay | Admitting: *Deleted

## 2023-04-26 MED ORDER — ROSUVASTATIN CALCIUM 20 MG PO TABS: 20.0000 mg | ORAL_TABLET | Freq: Every day | ORAL | 0 refills | Status: DC

## 2023-04-26 NOTE — Telephone Encounter (Signed)
Contacted pt and he requested a 2 wk refill for local pharmacy until mail order is received. Both Rx [facility]ills sent [facility].

## 2023-04-26 NOTE — Telephone Encounter (Signed)
Requested Prescriptions   Signed Prescriptions Disp Refills   rosuvastatin (CRESTOR) 20 MG tablet 14 tablet 0    Sig: Take 1 tablet (20 mg total) by mouth daily.    Authorizing Provider: END, CHRISTOPHER    Ordering User: Kendrick Fries   Requested Prescriptions   Signed Prescriptions Disp Refills   rosuvastatin (CRESTOR) 20 MG tablet 90 tablet 0    Sig: Take 1 tablet (20 mg total) by mouth daily.    Authorizing Provider: END, CHRISTOPHER    Ordering User: Kendrick Fries

## 2023-04-26 NOTE — Telephone Encounter (Signed)
*  STAT* If patient is at the pharmacy, call can be transferred to refill team.   1. Which medications need to be refilled? (please list name of each medication and dose if known)   rosuvastatin (CRESTOR) 20 MG tablet   2. Would you like to learn more about the convenience, safety, & potential cost savings by using the [facility]?   3. Are you open to using the [facility] (Type [facility]. ).  4. Which pharmacy/location (including street and city if local pharmacy) is medication to be sent to?  [facility] - [place], [place] - [address]  and [facility] - [place], [place] - [address]   5. Do they need a 30 day or 90 day supply?   90 day ([facility])  Patient stated he is completely out of this medication.  Patient wants some medication sent to his local pharmacy  [facility] as well.  Patient has appointment scheduled on 9/27.

## 2023-05-24 ENCOUNTER — Encounter: Payer: Self-pay | Admitting: Nurse Practitioner

## 2023-05-24 ENCOUNTER — Ambulatory Visit: Payer: Medicare Other | Attending: Nurse Practitioner | Admitting: Nurse Practitioner

## 2023-05-24 VITALS — BP 124/74 | HR 64 | Ht 69.0 in | Wt 233.0 lb

## 2023-05-24 DIAGNOSIS — I10 Essential (primary) hypertension: Secondary | ICD-10-CM | POA: Diagnosis present

## 2023-05-24 DIAGNOSIS — I5032 Chronic diastolic (congestive) heart failure: Secondary | ICD-10-CM | POA: Diagnosis present

## 2023-05-24 DIAGNOSIS — E785 Hyperlipidemia, unspecified: Secondary | ICD-10-CM | POA: Diagnosis present

## 2023-05-24 DIAGNOSIS — I2510 Atherosclerotic heart disease of native coronary artery without angina pectoris: Secondary | ICD-10-CM | POA: Diagnosis present

## 2023-05-24 MED ORDER — ROSUVASTATIN CALCIUM 20 MG PO TABS: 20.0000 mg | ORAL_TABLET | Freq: Every day | ORAL | 3 refills | Status: DC

## 2023-05-24 NOTE — Patient Instructions (Signed)
Medication Instructions:  Take the 81 mg aspirin   *If you need a refill on your cardiac medications before your next appointment, please call your pharmacy*   Lab Work: None ordered If you have labs (blood work) drawn today and your tests are completely normal, you will receive your results only by: MyChart Message (if you have MyChart) OR A paper copy in the mail If you have any lab test that is abnormal or we need to change your treatment, we will call you to review the results.   Testing/Procedures: None ordered   Follow-Up: At [facility], you and your health needs are our priority.  As part of our continuing mission to provide you with exceptional heart care, we have created designated Provider Care Teams.  These Care Teams include your primary Cardiologist (physician) and Advanced Practice Providers (APPs -  Physician Assistants and Nurse Practitioners) who all work together to provide you with the care you need, when you need it.  We recommend signing up for the patient portal called "MyChart".  Sign up information is provided on this After Visit Summary.  MyChart is used to connect with patients for Virtual Visits (Telemedicine).  Patients are able to view lab/test results, encounter notes, upcoming appointments, etc.  Non-urgent messages can be sent to your provider as well.   To learn more about what you can do with MyChart, go to [email or web address].    Your next appointment:   12 month(s)  Provider:   Dr. Okey Dupre

## 2023-05-24 NOTE — Progress Notes (Signed)
Office Visit    Patient Name: Ryan [name]. Date of Encounter: 05/24/2023  Primary Care Provider:  Sandford Craze, NP Primary Cardiologist:  Yvonne Kendall, MD  Chief Complaint    67 y.o. male with a history of atypical chest pain, nonobstructive CAD, hypertension, hyperlipidemia, hepatic steatosis, and GERD, who presents for f/u related to CAD.  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 10/2019 Echo: EF 60-65%, no rwma, GrII DD, nl RV fxn. Mildly dil LA.   Fatty liver    fatty liver disease   History of indigestion    History of kidney stones    Hyperlipidemia    Hypertension    Non-obstructive CAD (coronary artery disease)    a. 2016 Neg MV; b. 10/2017 Cath: LM nl, LAD 59m, D1/2/3 nl, LCX 20p, RCA nl, EF 50-55%; c. 09/2022 Cor CTA: Ca2+ = 180 (68th %'ile). LAD 0-24p, D2 0-24%, LCX 50-69ost (CTFFR normal).   Sleep apnea    C-PAP   SOB (shortness of breath) 2019   after climbing stairs   Past Surgical History:  Procedure Laterality Date   COLONOSCOPY  01/2021   CYSTOSCOPY W/ URETERAL STENT PLACEMENT Right 07/04/2021   Procedure: CYSTOSCOPY WITH RETROGRADE PYELOGRAM/DIAGNOSTIC URETEROSCOPY/URETERAL STENT PLACEMENT;  Surgeon: Crist Fat, MD;  Location: [facility];  Service: Urology;  Laterality: Right;   CYSTOSCOPY/URETEROSCOPY/HOLMIUM LASER/STENT PLACEMENT Right 07/25/2021   Procedure: CYSTOSCOPY, URETEROSCOPY, PYELOSCOPY, HOLMIUM LASER, STENT EXCHANGE;  Surgeon: Belva Agee, MD;  Location: [facility];  Service: Urology;  Laterality: Right;  1 HR    EXTRACORPOREAL SHOCK WAVE LITHOTRIPSY Right 06/26/2021   Procedure: EXTRACORPOREAL SHOCK WAVE LITHOTRIPSY (ESWL);  Surgeon: Crista Elliot, MD;  Location: [facility];  Service: Urology;  Laterality: Right;   LEFT HEART CATH AND CORONARY ANGIOGRAPHY Left 10/29/2017   Procedure: LEFT HEART CATH AND CORONARY ANGIOGRAPHY;  Surgeon: Yvonne Kendall, MD;  Location: [facility];  Service: Cardiovascular;  Laterality: Left;   SKIN CANCER EXCISION     TONSILLECTOMY  1965   as a child   Allergies  Allergies  Allergen Reactions   Atorvastatin Other (See Comments)    Hands swelled   Tape     Caused redness    History of Present Illness      67 y.o. y/o male with above past medical history including atypical chest pain, nonobstructive CAD, hypertension, hyperlipidemia, hepatic steatosis, and GERD. He previously had a negative ischemic evaluation 2016. Echo in early 2018 showed normal LV function. In early 2019, he was evaluated with complaints of exertional chest pain and underwent diagnostic catheterization revealing mild to moderate nonobstructive CAD. In the setting of lower extremity swelling, echo in March 2021 was performed and showed an EF of 60 to 65% with grade 2 diastolic dysfunction and normal RV function.  In the setting of recurrent episodic chest pain, he underwent coronary CT angiogram in February 2024, showing a calcium score of 180  with moderate circumflex (50-69%), and mild LAD and second diagonal disease.  FFRct analysis was normal and he was medically managed.   Mr. Yetta Barre was doing well at his work 2024 visit.  Since then, he has done well.  He remains active without symptoms or limitations.  He notes that he has not been taking his aspirin and I encouraged him to resume given history of nonobstructive CAD.  He denies chest pain, palpitations, dyspnea, pnd, orthopnea, n, v, dizziness, syncope, edema, weight gain, or early satiety.   Home Medications     Current Outpatient Medications  Medication Sig Dispense Refill   aspirin EC 81 MG tablet Take 1 tablet (81 mg total) by mouth daily. 90 tablet 3   losartan (COZAAR) 50 MG tablet Take 1 tablet (50 mg total) by mouth daily. 90 tablet 3   Omega-3 Fatty Acids (FISH OIL) 1000 MG CAPS Take 2,000 mg by mouth 2 (two) times daily.     vitamin E 200 UNIT capsule Take 200 Units by mouth daily.     rosuvastatin (CRESTOR) 20 MG tablet Take 1 tablet (20 mg total) by mouth daily. 90 tablet 3   No current facility-administered medications for this visit.     Review of Systems    He denies chest pain, palpitations, dyspnea, pnd, orthopnea, n, v, dizziness, syncope, edema, weight gain, or early satiety.  All other systems reviewed and are otherwise negative except as noted above.    Physical Exam    VS:  BP 124/74 (BP Location: Left Arm, Patient Position: Sitting, Cuff Size: Large)   Pulse 64   Ht 5\' 9"  (1.753 m)   Wt 233 lb (105.7 kg)   SpO2 98%   BMI 34.41 kg/m  , BMI Body mass index is 34.41 kg/m.     GEN: Well nourished, well developed, in no acute distress. HEENT: normal. Neck: Supple, no JVD, carotid bruits, or masses. Cardiac: RRR, no murmurs, rubs, or gallops. No clubbing, cyanosis, edema.  Radials 2+/PT 2+ and equal bilaterally.  Respiratory:  Respirations regular and unlabored, clear to auscultation bilaterally. GI: Soft, nontender, nondistended, BS + x 4. MS: no deformity or atrophy. Skin: warm and dry, no rash. Neuro:  Strength and sensation are intact. Psych: Normal affect.  Accessory Clinical Findings    ECG personally reviewed by me today - EKG Interpretation Date/Time:  Friday May 24 2023 10:09:44 EDT Ventricular Rate:  64 PR Interval:  174 QRS Duration:  78 QT Interval:  354 QTC Calculation: 365 R Axis:   39  Text Interpretation: Normal sinus rhythm Normal ECG Confirmed by Nicolasa Ducking [phone]) on 05/24/2023 10:15:58 AM  - no acute changes.  Lab Results   Component Value Date   WBC 5.9 04/03/2023   HGB 14.2 04/03/2023   HCT 39.8 04/03/2023   MCV 86.0 04/03/2023   PLT 173 04/03/2023   Lab Results  Component Value Date   CREATININE 1.04 04/03/2023   BUN 20 04/03/2023   NA 139 04/03/2023   K 4.5 04/03/2023   CL 106 04/03/2023   CO2 28 04/03/2023   Lab Results  Component Value Date   ALT 16 04/03/2023   AST 21 04/03/2023   ALKPHOS 62 04/03/2023   BILITOT 0.6 04/03/2023   Lab Results  Component Value Date   CHOL 121 11/29/2022   HDL 48 11/29/2022   LDLCALC 53 11/29/2022   LDLDIRECT 176 (H) 04/27/2022   TRIG 100 11/29/2022   CHOLHDL 2.5 11/29/2022    Lab Results  Component Value Date   HGBA1C 5.6 04/25/2021    Assessment & Plan    1.  Precordial chest pain/nonobstructive CAD: Catheterization 2019 with mild to moderate nonobstructive CAD.  Coronary CT angiogram in the setting of recurrent chest pain in February 2024 again showed mild to moderate nonobstructive CAD with 50 to 69% ostial stenosis of the left circumflex and normal FFRct.  He remains active without chest pain or dyspnea.  ECG is normal today.  He has not been taking aspirin.  We discussed in the setting of nonobstructive CAD, he should remain on aspirin and statin therapy, and he will resume aspirin.  2.  Essential hypertension: Blood pressure stable at 124/74 on losartan therapy.  Normal renal function and electrolytes in August.  3.  Hyperlipidemia: LDL of 53 in April 2024 with normal LFTs in August.  Continue statin therapy.  4.  Chronic HFpEF: Euvolemic.  Heart rate blood pressure stable.  5.  Palpitations: Quiescent.  6.  Disposition: Follow-up in 1 year or sooner if necessary.  Nicolasa Ducking, NP 05/24/2023, 10:35 AM

## 2023-06-13 IMAGING — CT CT CHEST W/O CM
2 of 4 series · 14 of 36 positions shown, 17 images · non-contrast
Comparison: Chest CT 01/02/2021.

CLINICAL DATA: 65-year-old male with history of pulmonary nodule.
Followup study.



[Series 2: thorax · axial · 0.74mm/px · z∈[-328,-68]mm · 11 of 154 slices shown, 14 images]
[im 12/154  mediastinal]
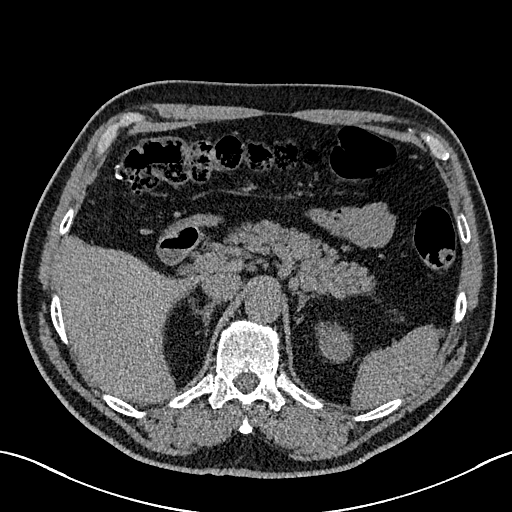
[im 12/154  lung]
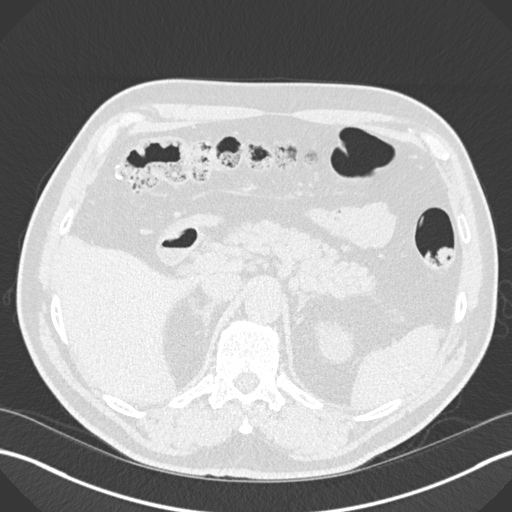
[im 24/154  lung]
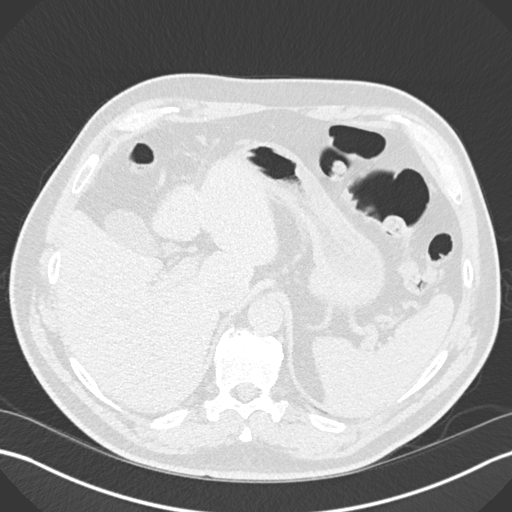
[im 36/154  lung]
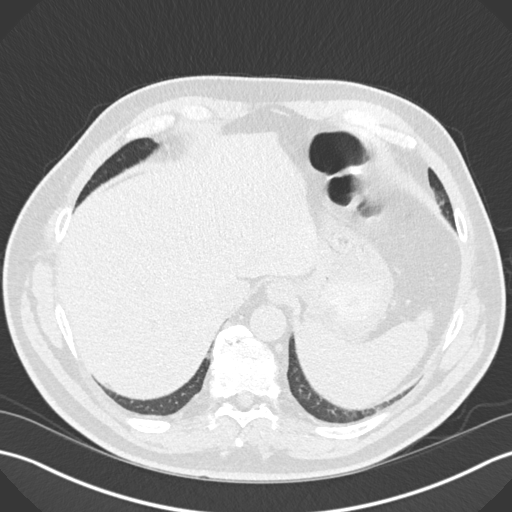
[im 48/154  lung]
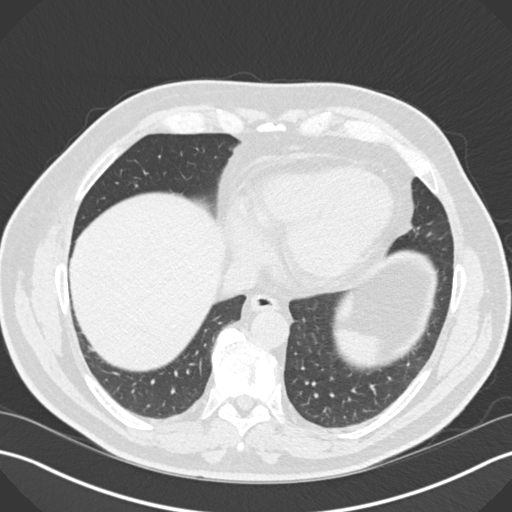
[im 59/154  mediastinal]
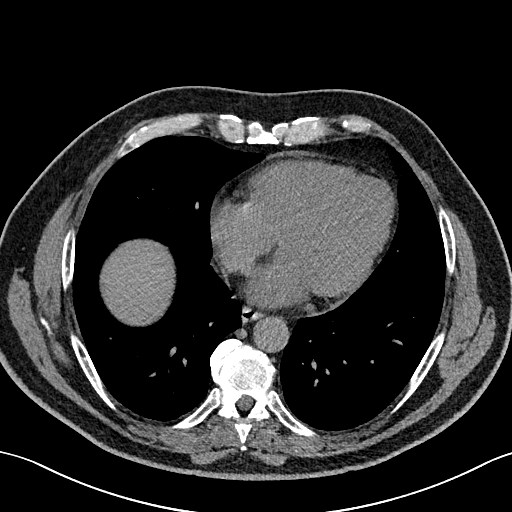
[im 59/154  lung]
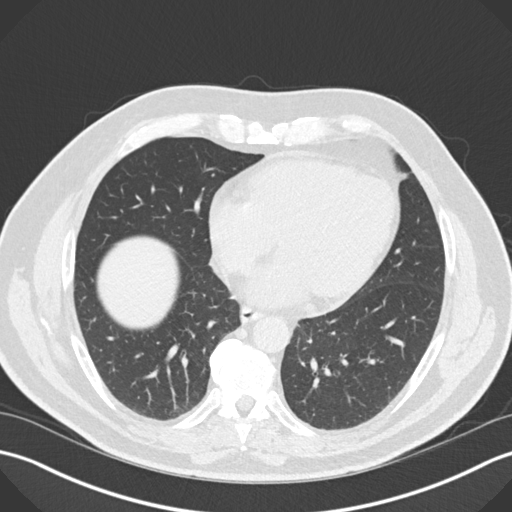
[im 83/154  lung]
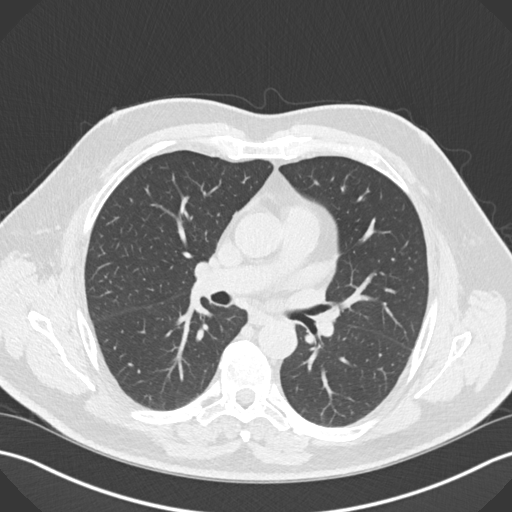
[im 95/154  lung]
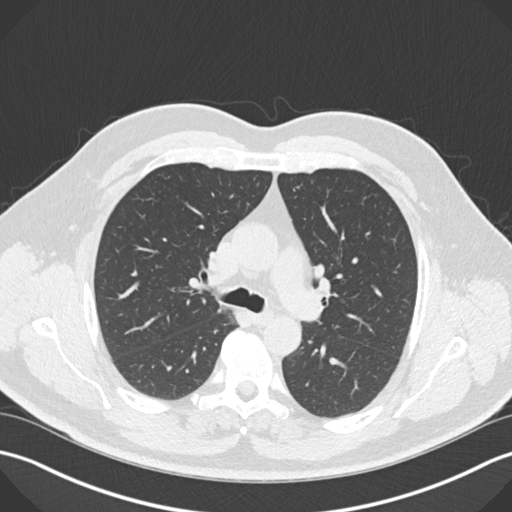
[im 106/154  lung]
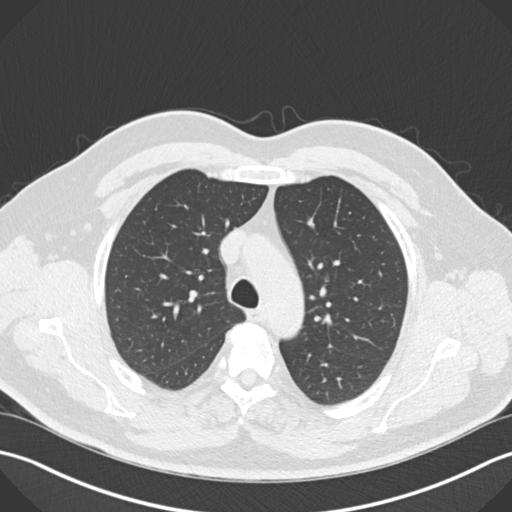
[im 118/154  mediastinal]
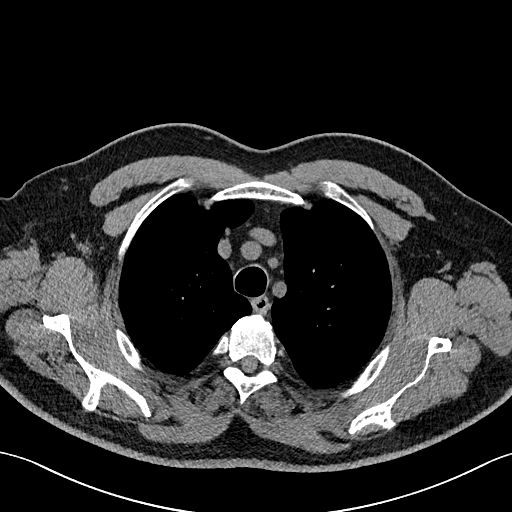
[im 118/154  lung]
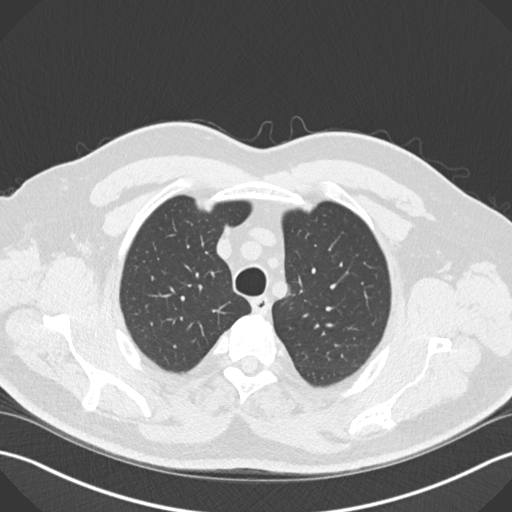
[im 130/154  lung]
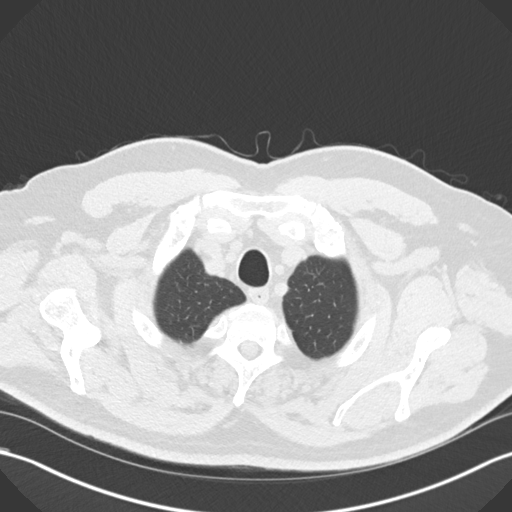
[im 142/154  lung]
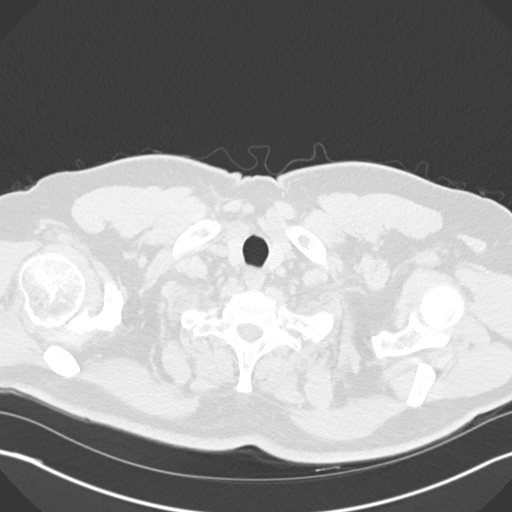

[Series 5: coronal · coronal · 0.60mm/px · 3 of 137 slices shown]
[im 28/137  lung]
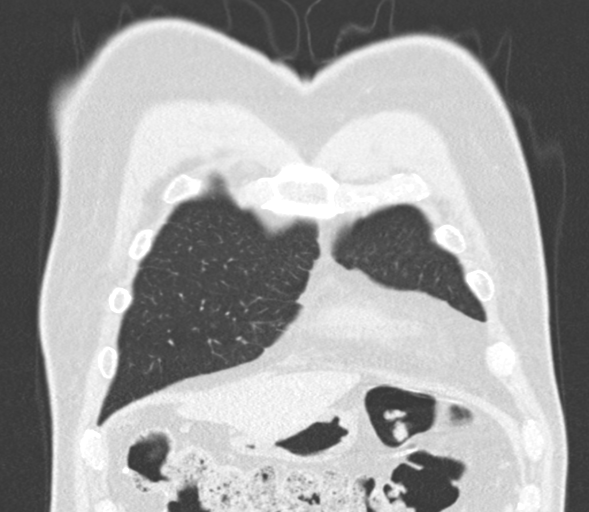
[im 55/137  lung]
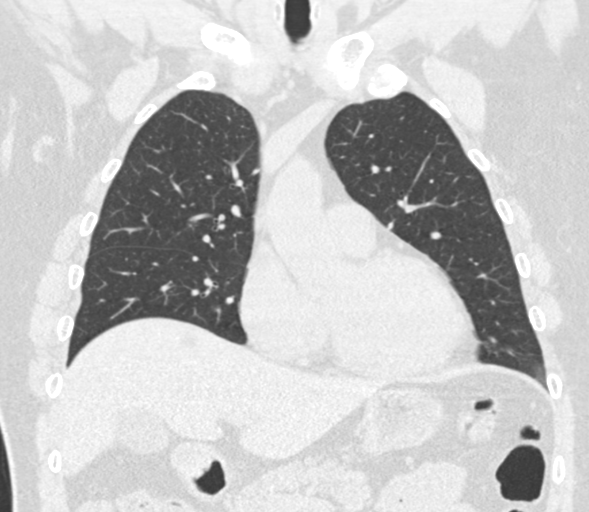
[im 82/137  lung]
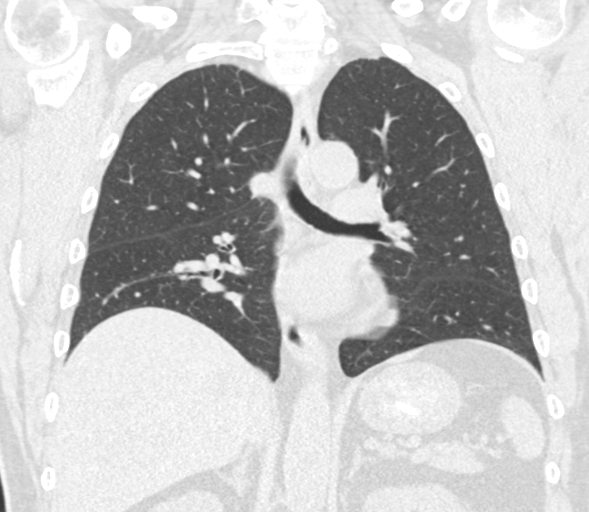

[14 of 36 positions shown; findings below may reference images not displayed]

FINDINGS: Cardiovascular: Heart size is normal. There is no significant
pericardial fluid, thickening or pericardial calcification. There is
aortic atherosclerosis, as well as atherosclerosis of the great
vessels of the mediastinum and the coronary arteries, including
calcified atherosclerotic plaque in the left main, left anterior
descending, left circumflex and right coronary arteries.

Mediastinum/Nodes: No pathologically enlarged mediastinal or hilar
lymph nodes. Please note that accurate exclusion of hilar adenopathy
is limited on noncontrast CT scans. Esophagus is unremarkable in
appearance. No axillary lymphadenopathy.

Lungs/Pleura: 5 mm nodule in the anterior aspect of the right lower
lobe (axial image 90 of series 3), slightly smaller than the prior
examination, indicative of a benign nodule. Small calcified
granuloma also noted in the lateral segment of the right middle
lobe. No other suspicious appearing pulmonary nodules or masses are
noted. No acute consolidative airspace disease. No pleural
effusions.

Upper Abdomen: Several low-attenuation lesions in the visualized
portions of the liver, incompletely characterized on today's
non-contrast CT examination, but similar to the prior study and
statistically likely to represent small cysts.

Musculoskeletal: There are no aggressive appearing lytic or blastic
lesions noted in the visualized portions of the skeleton.
IMPRESSION: 1. Slight decreased size of right lower lobe pulmonary nodule,
considered definitively benign requiring no future imaging
follow-up.
2. Aortic atherosclerosis, in addition to left main and three-vessel
coronary artery disease. Please note that although the presence of
coronary artery calcium documents the presence of coronary artery
disease, the severity of this disease and any potential stenosis
cannot be assessed on this non-gated CT examination. Assessment for
potential risk factor modification, dietary therapy or pharmacologic
therapy may be warranted, if clinically indicated.

Aortic Atherosclerosis (JIFZ2-EIB.B).

## 2023-07-01 ENCOUNTER — Telehealth: Payer: Self-pay | Admitting: Internal Medicine

## 2023-07-01 NOTE — Telephone Encounter (Signed)
Patient needs a cpap prescription to be sent to [facility]. Medicare says that he needs a new appointment in order for him to get supplies but he was seen in April 2024. Please advise.

## 2023-07-02 NOTE — Telephone Encounter (Signed)
Spoke to patient. He stated that Medicare is requesting a recent visit in order to supply him with cpap supplies. Medicare took affect in June.  Appt scheduled 07/08/2023 at 9:00. Nothing further needed.

## 2023-07-05 NOTE — Progress Notes (Unsigned)
HPI M cigar smoker followed for OSA, complicated by Blind L eye (congenital), CAD/ angina, Diastolic Dysfunction, HTN, Fatty Liver, GERD, Hyperlipidemia, Raynaud's, Tobacco use, Diastolic Dysfunction/ edema, HST- 09/23/19- AHI  54.2/ hr, desaturation to 64%, body weight 232 lbs   ===============================================  12/06/22-  66 yoM cigar Smoker followed for OSA, Lung Nodule, Asbestos Exposure,  complicated by Blind L eye (congenital), CAD/ angina, Diastolic Dysfunction, HTN, Fatty Liver, GERD, Hyperlipidemia, Raynaud's, Tobacco use, Diastolic Dysfunction/ edema, Kidney Stone, Small Bowel Malignant Carcinoid/ Partial Colectomy, ,  CPAP auto 10-20/ [facility]    Ordered 09/30/19 Body weight today-228 lbs Download- compliance 100%, AHI 2.4/ hr Download reviewed.  Very comfortable with his CPAP. Was evaluated for chest pain with cardiac CT which was stable pulmonary, considered evidence of old granulomatous disease..  BP meds were adjusted. Incidental tinnitus which we discussed with advised to see ENT/audiology.  07/08/23- 67 yoM cigar Smoker followed for OSA, Lung Nodule, Asbestos Exposure,  complicated by Blind L eye (congenital), CAD/ angina, Gr2Diastolic Dysfunction, HTN, Fatty Liver, GERD, Hyperlipidemia, Raynaud's, Tobacco use, Diastolic Dysfunction/ edema, Kidney Stone, Small Bowel Malignant Carcinoid/ Partial Colectomy, ,  CPAP auto 10-20/ [facility]    Ordered 09/30/19 Body weight today-240 Download- compliance 100%, AHI 1.4/hr Declines flu vax Download reviewed. Doing well with CPAP. No concerns. Needs supplies authorized. Recognizes weight gain 228> 240 lbs change since April. Breathing stable- denies change or  concerns. Lung nodule stable on CT. CT chest abdpelvis 04/03/23- IMPRESSION: 1. Surgical change of prior right hemicolectomy with ileocolic anastomosis in the right upper quadrant. 2. No convincing evidence of local recurrence or metastatic disease in the chest, abdomen or  pelvis. 3. Stable 5 mm pulmonary nodule in the right lower lobe and perifissural right lower lobe pulmonary nodule measuring 4 mm, favored benign.  ROS-see HPI   + = positive Constitutional:    weight loss, night sweats, fevers, chills, +fatigue, lassitude. HEENT:    headaches, difficulty swallowing, tooth/dental problems, sore throat,       sneezing, itching, ear ache, nasal congestion, post nasal drip, snoring CV:    chest pain, orthopnea, PND, +swelling in lower extremities, anasarca,                                   dizziness,+ palpitations Resp:   shortness of breath with exertion or at rest.                productive cough,   non-productive cough, coughing up of blood.              change in color of mucus.  wheezing.   Skin:    rash or lesions. GI:  No-   heartburn, indigestion, abdominal pain, nausea, vomiting, diarrhea,                 change in bowel habits, loss of appetite GU: dysuria, change in color of urine, no urgency or frequency.   flank pain. MS:   joint pain, stiffness, decreased range of motion, back pain. Neuro-     nothing unusual Psych:  change in mood or affect.  depression or anxiety.   memory loss.  OBJ- Physical Exam General- Alert, Oriented, Affect-appropriate, Distress- none acute Skin- rash-none, lesions- none, excoriation- none Lymphadenopathy- none Head- atraumatic            Eyes- + L strabismus, +blind L eye,  Ears- Hearing, canals-normal            Nose- Clear, no-Septal dev, mucus, polyps, erosion, perforation             Throat- Mallampati IV , mucosa clear , drainage- none, tonsils- atrophic Neck- flexible , trachea midline, no stridor , thyroid nl, carotid no bruit Chest - symmetrical excursion , unlabored           Heart/CV- RRR , no murmur , no gallop  , no rub, nl s1 s2                           - JVD- none , edema trace, stasis changes- none, varices- none           Lung- clear to P&A, wheeze- none, cough- none ,  dullness-none, rub- none           Chest wall-  Abd-  Br/ Gen/ Rectal- Not done, not indicated Extrem- cyanosis- none, clubbing, none, atrophy- none, strength- nl Neuro- grossly intact to observation

## 2023-07-08 ENCOUNTER — Ambulatory Visit (INDEPENDENT_AMBULATORY_CARE_PROVIDER_SITE_OTHER): Payer: Medicare Other | Admitting: Internal Medicine

## 2023-07-08 ENCOUNTER — Encounter: Payer: Self-pay | Admitting: Internal Medicine

## 2023-07-08 VITALS — BP 148/76 | HR 65 | Temp 97.9°F | Ht 69.0 in | Wt 240.0 lb

## 2023-07-08 DIAGNOSIS — R911 Solitary pulmonary nodule: Secondary | ICD-10-CM | POA: Diagnosis not present

## 2023-07-08 DIAGNOSIS — G4733 Obstructive sleep apnea (adult) (pediatric): Secondary | ICD-10-CM

## 2023-07-08 NOTE — Patient Instructions (Signed)
You are doing great with CPAP. We can continue auto 10-20.  Order- DME [facility]- please continue CPAP auto 10-20, mask of choice, humidifier, supplies, AirView  Please call if we can help

## 2023-07-08 NOTE — Assessment & Plan Note (Signed)
Benefits from CPAP with good compliance and control Plan- continue auto 10-20 

## 2023-07-08 NOTE — Assessment & Plan Note (Signed)
Stable on CT 5 mm and 4 mm. Likely benign. Plan- continue to watch as needed.

## 2023-10-08 ENCOUNTER — Telehealth: Payer: Self-pay | Admitting: Nurse Practitioner

## 2023-10-08 NOTE — Telephone Encounter (Signed)
Rescheduled appointments per patients request due to being sick.. Patient is aware of the changes made to his upcoming appointments.

## 2023-10-09 ENCOUNTER — Other Ambulatory Visit: Payer: Medicare Other

## 2023-10-09 ENCOUNTER — Ambulatory Visit: Payer: Medicare Other | Admitting: Nurse Practitioner

## 2023-10-20 ENCOUNTER — Other Ambulatory Visit: Payer: Self-pay | Admitting: Nurse Practitioner

## 2023-10-20 DIAGNOSIS — C7A012 Malignant carcinoid tumor of the ileum: Secondary | ICD-10-CM

## 2023-10-20 NOTE — Progress Notes (Unsigned)
 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 CT [facility] 12/02/20 showed incidental 1.9 cm ileum/small bowel mass. CT chest 01/02/21 negative for metastatic disease.  -colonoscopy on 02/15/21 under Dr. Marina Goodell, path showed low grade neuroendocrine tumor.   -s/p partial colectomy by Dr. Michaell Cowing on 05/10/21, path showed well differentiated 2.2 cm NET in the terminal ileum invading the muscularis propria, clear margins, 2/12 positive lymph nodes. Staged pT2pN1 -Baseline chromogranin A and 24hr urine from 06/22/21 were normal -surveillance DOTATATE PET on 04/12/22 showed NED. -03/2023 - Surveillance CT scan of chest, abdomen pelvis showed no evidence of recurrence, he has a few 4 to 5 mm lung nodules, likely benign.  10/22/2023 - clinically stable with CBC and CMP within normal limits. Awaiting results of chromogranin A. New CT CAP to be scheduled in 03/2024 with labs and follow up in 6 months.    Plan:  Labs reviewed. Stable and within normal limits Chromogranin A results pending.  Reviewed CT CAP from 03/2023. Showed no evidence of recurrence or metastatic disease. New CT CAP to be scheduled for 03/2024.  Labs and follow up in 6 months (approximately 1 to 2 weeks after CT to review).   The patient understands the plans discussed today and is in agreement with them.  He knows to  contact our office if he develops concerns prior to his next appointment.  I provided 20 minutes of face-to-face time during this encounter and > 50% was spent counseling as documented under my assessment and plan.    Carlean Jews, NP  [facility] [address] [place] [address] Dept: [phone] Dept Fax: [phone]   Orders Placed This Encounter  Procedures   CT CHEST ABDOMEN PELVIS W CONTRAST    Standing Status:   Future    Expected Date:   04/20/2024    Expiration Date:   10/21/2024    If indicated for the ordered procedure, I authorize the administration of contrast media per Radiology protocol:   Yes    Does the patient have a contrast media/X-ray dye allergy?:   No    Preferred imaging location?:   [facility]    If indicated for the ordered procedure, I authorize the administration of oral contrast media per Radiology protocol:   Yes      CHIEF COMPLAINT:  CC: Malignant carcinoid tumor of ileum  Current Treatment: Surveillance  INTERVAL HISTORY:  Ryan [name] is here today for repeat clinical assessment.  He last saw Dr. Mosetta Putt on 04/10/2023.  Most recent CT CAP done 04/03/2023.  There is no evidence of recurrence or metastases in the chest, abdomen, or pelvis.  Next CT scan should be scheduled for August 2025. He states that he does have occasional abdominal discomfort with no other concerns or complaints. He denies chest pain, chest pressure, or shortness of breath. He denies headaches  or visual disturbances. He denies abdominal pain, nausea, vomiting, or changes in bowel or bladder habits.  He denies blood in  the stool.  He denies fevers or chills. He denies pain. His appetite is good. His weight has decreased 4 pounds over last 4 months . He does report that he has not smoked a cigar since Thanksgiving 2024.   I have reviewed the past medical history, past surgical history,  social history and family history with the patient and they are unchanged from previous note.  ALLERGIES:  is allergic to atorvastatin and tape.  MEDICATIONS:  Current Outpatient Medications  Medication Sig Dispense Refill   aspirin EC 81 MG tablet Take 1 tablet (81 mg total) by mouth daily. 90 tablet 3   losartan (COZAAR) 50 MG tablet Take 1 tablet (50 mg total) by mouth daily. 90 tablet 3   Omega-3 Fatty Acids (FISH OIL) 1000 MG CAPS Take 2,000 mg by mouth 2 (two) times daily.     rosuvastatin (CRESTOR) 20 MG tablet Take 1 tablet (20 mg total) by mouth daily. 90 tablet 3   vitamin E 200 UNIT capsule Take 200 Units by mouth daily.     No current facility-administered medications for this visit.      REVIEW OF SYSTEMS:   Constitutional: Denies fevers, chills or abnormal weight loss Eyes: Denies blurriness of vision Ears, nose, mouth, throat, and face: Denies mucositis or sore throat Respiratory: Denies cough, dyspnea or wheezes Cardiovascular: Denies palpitation, chest discomfort or lower extremity swelling Gastrointestinal:  Denies nausea, heartburn or change in bowel habits. Intermittent abdominal discomfort.  Skin: Denies abnormal skin rashes Lymphatics: Denies new lymphadenopathy or easy bruising Neurological:Denies numbness, tingling or new weaknesses Behavioral/Psych: Mood is stable, no new changes  All other systems were reviewed with the patient and are negative.   VITALS:   Today's Vitals   10/22/23 1039  BP: (!) 150/82  Pulse: 66  Resp: (!) 21  Temp: 98.2 F (36.8 C)  TempSrc: Temporal  SpO2: 98%  Weight: 236 lb (107 kg)   Body mass index is 34.85 kg/m.   Wt Readings from Last 3 Encounters:  10/22/23 236 lb (107 kg)  07/08/23 240 lb (108.9 kg)  05/24/23 233 lb (105.7 kg)    Body mass index is 34.85 kg/m.  Performance status (ECOG): 1 - Symptomatic but completely ambulatory  PHYSICAL EXAM:   GENERAL:alert, no distress and comfortable SKIN: skin  color, texture, turgor are normal, no rashes or significant lesions EYES: normal, Conjunctiva are pink and non-injected, sclera clear OROPHARYNX:no exudate, no erythema and lips, buccal mucosa, and tongue normal  NECK: supple, thyroid normal size, non-tender, without nodularity LYMPH:  no palpable lymphadenopathy in the cervical, axillary or inguinal LUNGS: clear to auscultation and percussion with normal breathing effort HEART: regular rate & rhythm and no murmurs and no lower extremity edema ABDOMEN:abdomen soft, non-tender and normal bowel sounds Musculoskeletal:no cyanosis of digits and no clubbing  NEURO: alert & oriented x 3 with fluent speech, no focal motor/sensory deficits  LABORATORY DATA:  I have reviewed the data as listed    Component Value Date/Time   NA 140 10/22/2023 1021   NA 141 09/20/2021 0829   K 4.5 10/22/2023 1021   CL 106 10/22/2023 1021   CO2 29 10/22/2023 1021   GLUCOSE 98 10/22/2023 1021   BUN 19 10/22/2023 1021   BUN 16 09/20/2021 0829   CREATININE 1.14 10/22/2023 1021   CREATININE 1.23 06/29/2020 1234   CALCIUM 9.7 10/22/2023  1021   PROT 7.2 10/22/2023 1021   PROT 7.1 09/20/2021 0829   ALBUMIN 4.4 10/22/2023 1021   ALBUMIN 4.6 09/20/2021 0829   AST 18 10/22/2023 1021   ALT 18 10/22/2023 1021   ALKPHOS 60 10/22/2023 1021   BILITOT 0.7 10/22/2023 1021   GFRNONAA >60 10/22/2023 1021   GFRAA 74 12/02/2019 1512    Lab Results  Component Value Date   WBC 5.4 10/22/2023   NEUTROABS 3.6 04/03/2023   HGB 14.3 10/22/2023   HCT 41.9 10/22/2023   MCV 87.8 10/22/2023   PLT 207 10/22/2023

## 2023-10-20 NOTE — Assessment & Plan Note (Signed)
 grade 1, pT2N1, stage III, Ki-67 < 1% -presented with hematuria. CT AP 12/02/20 showed incidental 1.9 cm ileum/small bowel mass. CT chest 01/02/21 negative for metastatic disease.  -colonoscopy on 02/15/21 under Dr. Marina Goodell, path showed low grade neuroendocrine tumor.   -s/p partial colectomy by Dr. Michaell Cowing on 05/10/21, path showed well differentiated 2.2 cm NET in the terminal ileum invading the muscularis propria, clear margins, 2/12 positive lymph nodes. Staged pT2pN1 -Baseline chromogranin A and 24hr urine from 06/22/21 were normal -surveillance DOTATATE PET on 04/12/22 showed NED. -03/2023 - Surveillance CT scan of chest, abdomen pelvis showed no evidence of recurrence, he has a few 4 to 5 mm lung nodules, likely benign.  10/22/2023 - clinically stable with CBC and CMP within normal limits. Awaiting results of chromogranin A. New CT CAP to be scheduled in 03/2024 with labs and follow up in 6 months.

## 2023-10-22 ENCOUNTER — Encounter: Payer: Self-pay | Admitting: Nurse Practitioner

## 2023-10-22 ENCOUNTER — Inpatient Hospital Stay: Payer: Medicare Other | Attending: Nurse Practitioner

## 2023-10-22 ENCOUNTER — Inpatient Hospital Stay (HOSPITAL_BASED_OUTPATIENT_CLINIC_OR_DEPARTMENT_OTHER): Payer: Medicare Other | Admitting: Nurse Practitioner

## 2023-10-22 VITALS — BP 150/82 | HR 66 | Temp 98.2°F | Resp 21 | Wt 236.0 lb

## 2023-10-22 DIAGNOSIS — Z79899 Other long term (current) drug therapy: Secondary | ICD-10-CM | POA: Diagnosis not present

## 2023-10-22 DIAGNOSIS — C7A012 Malignant carcinoid tumor of the ileum: Secondary | ICD-10-CM

## 2023-10-22 DIAGNOSIS — Z85060 Personal history of malignant carcinoid tumor of small intestine: Secondary | ICD-10-CM | POA: Diagnosis present

## 2023-10-22 LAB — CMP (CANCER CENTER ONLY)
ALT: 18 U/L (ref 0–44)
AST: 18 U/L (ref 15–41)
Albumin: 4.4 g/dL (ref 3.5–5.0)
Alkaline Phosphatase: 60 U/L (ref 38–126)
Anion gap: 5 (ref 5–15)
BUN: 19 mg/dL (ref 8–23)
CO2: 29 mmol/L (ref 22–32)
Calcium: 9.7 mg/dL (ref 8.9–10.3)
Chloride: 106 mmol/L (ref 98–111)
Creatinine: 1.14 mg/dL (ref 0.61–1.24)
GFR, Estimated: >60 mL/min (ref >60)
Glucose, Bld: 98 mg/dL (ref 70–99)
Potassium: 4.5 mmol/L (ref 3.5–5.1)
Sodium: 140 mmol/L (ref 135–145)
Total Bilirubin: 0.7 mg/dL (ref 0.0–1.2)
Total Protein: 7.2 g/dL (ref 6.5–8.1)

## 2023-10-22 LAB — CBC (CANCER CENTER ONLY)
HCT: 41.9 % (ref 39.0–52.0)
Hemoglobin: 14.3 g/dL (ref 13.0–17.0)
MCH: 30 pg (ref 26.0–34.0)
MCHC: 34.1 g/dL (ref 30.0–36.0)
MCV: 87.8 fL (ref 80.0–100.0)
Platelet Count: 207 10*3/uL (ref 150–400)
RBC: 4.77 MIL/uL (ref 4.22–5.81)
RDW: 11.5 % (ref 11.5–15.5)
WBC Count: 5.4 10*3/uL (ref 4.0–10.5)
nRBC: 0 % (ref 0.0–0.2)

## 2023-10-23 ENCOUNTER — Telehealth: Payer: Self-pay | Admitting: Nurse Practitioner

## 2023-10-23 NOTE — Telephone Encounter (Signed)
 Patient is aware of scheduled appointment times/dates per 10/22/2023 scheduling message

## 2023-10-24 LAB — CHROMOGRANIN A: Chromogranin A (ng/mL): 59.7 ng/mL (ref 0.0–101.8)

## 2023-11-16 ENCOUNTER — Other Ambulatory Visit: Payer: Self-pay | Admitting: Nurse Practitioner

## 2023-12-05 NOTE — Progress Notes (Unsigned)
 HPI M cigar smoker followed for OSA, complicated by Blind L eye (congenital), CAD/ angina, Diastolic Dysfunction, HTN, Fatty Liver, GERD, Hyperlipidemia, Raynaud's, Tobacco use, Diastolic Dysfunction/ edema, HST- 09/23/19- AHI  54.2/ hr, desaturation to 64%, body weight 232 lbs   ===============================================   07/08/23- 67 yoM cigar Smoker followed for OSA, Lung Nodule, Asbestos Exposure,  complicated by Blind L eye (congenital), CAD/ angina, Gr2Diastolic Dysfunction, HTN, Fatty Liver, GERD, Hyperlipidemia, Raynaud's, Tobacco use, Diastolic Dysfunction/ edema, Kidney Stone, Small Bowel Malignant Carcinoid/ Partial Colectomy, ,  CPAP auto 10-20/ [employer]    Ordered 09/30/19 Body weight today-240 Download- compliance 100%, AHI 1.4/hr Declines flu vax Download reviewed. Doing well with CPAP. No concerns. Needs supplies authorized. Recognizes weight gain 228> 240 lbs change since April. Breathing stable- denies change or  concerns. Lung nodule stable on CT. CT chest abdpelvis 04/03/23- IMPRESSION: 1. Surgical change of prior right hemicolectomy with ileocolic anastomosis in the right upper quadrant. 2. No convincing evidence of local recurrence or metastatic disease in the chest, abdomen or pelvis. 3. Stable 5 mm pulmonary nodule in the right lower lobe and perifissural right lower lobe pulmonary nodule measuring 4 mm, favored benign.  12/06/23- 67 yoM cigar Smoker followed for OSA, Lung Nodules, Asbestos Exposure,  complicated by Blind L eye (congenital), CAD/ angina, Gr2Diastolic Dysfunction, HTN, Fatty Liver, GERD, Hyperlipidemia, Raynaud's, Tobacco use, Diastolic Dysfunction/ edema, Kidney Stone, Small Bowel Malignant Carcinoid/ Partial Colectomy, ,  CPAP auto 10-20/ [employer]    Ordered 09/30/19 Body weight today-233 lbs Download- compliance 100%, AHI 2.1/hr Download reviewed. He is doing quite well and sleeps better with CPAP. Machine is functioning normally. His lung nodules,  noted on CT, are being watched by Oncology, following for hx Neuroendocrine tumor/ Carcinoid. Incidental tinnitus- going for hearing assessment.  ROS-see HPI   + = positive Constitutional:    weight loss, night sweats, fevers, chills, +fatigue, lassitude. HEENT:    headaches, difficulty swallowing, tooth/dental problems, sore throat,       sneezing, itching, ear ache, nasal congestion, post nasal drip, snoring CV:    chest pain, orthopnea, PND, +swelling in lower extremities, anasarca,                                   dizziness,+ palpitations Resp:   shortness of breath with exertion or at rest.                productive cough,   non-productive cough, coughing up of blood.              change in color of mucus.  wheezing.   Skin:    rash or lesions. GI:  No-   heartburn, indigestion, abdominal pain, nausea, vomiting, diarrhea,                 change in bowel habits, loss of appetite GU: dysuria, change in color of urine, no urgency or frequency.   flank pain. MS:   joint pain, stiffness, decreased range of motion, back pain. Neuro-     nothing unusual Psych:  change in mood or affect.  depression or anxiety.   memory loss.  OBJ- Physical Exam General- Alert, Oriented, Affect-appropriate, Distress- none acute Skin- rash-none, lesions- none, excoriation- none Lymphadenopathy- none Head- atraumatic            Eyes- + L strabismus, +blind L eye,  Ears- Hearing, canals-normal            Nose- Clear, no-Septal dev, mucus, polyps, erosion, perforation             Throat- Mallampati IV , mucosa clear , drainage- none, tonsils- atrophic Neck- flexible , trachea midline, no stridor , thyroid nl, carotid no bruit Chest - symmetrical excursion , unlabored           Heart/CV- RRR , no murmur , no gallop  , no rub, nl s1 s2                           - JVD- none , edema trace, stasis changes- none, varices- none           Lung- clear to P&A, wheeze- none, cough- none , dullness-none,  rub- none           Chest wall-  Abd-  Br/ Gen/ Rectal- Not done, not indicated Extrem- cyanosis- none, clubbing, none, atrophy- none, strength- nl Neuro- grossly intact to observation

## 2023-12-06 ENCOUNTER — Ambulatory Visit: Payer: BC Managed Care – PPO | Admitting: Internal Medicine

## 2023-12-06 ENCOUNTER — Encounter: Payer: Self-pay | Admitting: Internal Medicine

## 2023-12-06 VITALS — BP 136/82 | HR 80 | Temp 97.7°F | Ht 68.5 in | Wt 233.6 lb

## 2023-12-06 DIAGNOSIS — R911 Solitary pulmonary nodule: Secondary | ICD-10-CM | POA: Diagnosis not present

## 2023-12-06 DIAGNOSIS — G4733 Obstructive sleep apnea (adult) (pediatric): Secondary | ICD-10-CM

## 2023-12-06 NOTE — Assessment & Plan Note (Signed)
Benefits from CPAP with good compliance and control Plan- continue auto 10-20 

## 2023-12-06 NOTE — Patient Instructions (Signed)
 You are doing great with CPAP - we can continue auto 10-20  Please call if we can help

## 2023-12-06 NOTE — Assessment & Plan Note (Signed)
 Being followed with CT per Oncology- hx Carcinoid Hopefully these nodules remain stable.

## 2023-12-12 ENCOUNTER — Encounter: Payer: Self-pay | Admitting: Internal Medicine

## 2024-04-09 ENCOUNTER — Ambulatory Visit: Attending: Nurse Practitioner | Admitting: Nurse Practitioner

## 2024-04-09 ENCOUNTER — Encounter: Payer: Self-pay | Admitting: Nurse Practitioner

## 2024-04-09 VITALS — BP 132/74 | HR 64 | Ht 68.0 in | Wt 235.2 lb

## 2024-04-09 DIAGNOSIS — I2510 Atherosclerotic heart disease of native coronary artery without angina pectoris: Secondary | ICD-10-CM | POA: Insufficient documentation

## 2024-04-09 DIAGNOSIS — R002 Palpitations: Secondary | ICD-10-CM | POA: Diagnosis present

## 2024-04-09 DIAGNOSIS — E785 Hyperlipidemia, unspecified: Secondary | ICD-10-CM | POA: Insufficient documentation

## 2024-04-09 DIAGNOSIS — I5032 Chronic diastolic (congestive) heart failure: Secondary | ICD-10-CM | POA: Insufficient documentation

## 2024-04-09 DIAGNOSIS — R072 Precordial pain: Secondary | ICD-10-CM | POA: Diagnosis present

## 2024-04-09 DIAGNOSIS — I10 Essential (primary) hypertension: Secondary | ICD-10-CM | POA: Diagnosis present

## 2024-04-09 MED ORDER — NITROGLYCERIN 0.4 MG SL SUBL: 0.4000 mg | SUBLINGUAL_TABLET | SUBLINGUAL | 3 refills | Status: AC | PRN

## 2024-04-09 MED ORDER — METOPROLOL TARTRATE 50 MG PO TABS: ORAL_TABLET | ORAL | 0 refills | Status: DC

## 2024-04-09 NOTE — Progress Notes (Signed)
 Office Visit    Patient Name: Ryan [name]. Date of Encounter: 04/09/2024  Primary Care Provider:  Daryl Setter, NP Primary Cardiologist:  Lonni Hanson, MD    Chief Complaint    68 y.o. male with a history of atypical chest pain, nonobstructive CAD, hypertension, hyperlipidemia, hepatic steatosis, and GERD, who presents for f/u related to CAD.   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 10/2019 Echo: EF 60-65%, no rwma, GrII DD, nl RV fxn. Mildly dil LA.   Fatty liver    fatty liver disease   History of indigestion    History of kidney stones    Hyperlipidemia    Hypertension    Non-obstructive CAD (coronary artery disease)    a. 2016 Neg MV; b. 10/2017 Cath: LM nl, LAD 68m, D1/2/3 nl, LCX 20p, RCA nl, EF 50-55%; c. 09/2022 Cor CTA: Ca2+ = 180 (68th %'ile). LAD 0-24p, D2 0-24%, LCX 50-69ost (CTFFR normal).   Sleep apnea    C-PAP   SOB (shortness of breath) 2019   after climbing stairs   Past Surgical History:  Procedure Laterality Date   COLONOSCOPY  01/2021   CYSTOSCOPY W/ URETERAL STENT PLACEMENT Right 07/04/2021   Procedure: CYSTOSCOPY WITH RETROGRADE PYELOGRAM/DIAGNOSTIC URETEROSCOPY/URETERAL STENT PLACEMENT;  Surgeon: Cam Morene ORN, MD;  Location: [facility];  Service: Urology;  Laterality: Right;   CYSTOSCOPY/URETEROSCOPY/HOLMIUM LASER/STENT PLACEMENT Right 07/25/2021   Procedure: CYSTOSCOPY, URETEROSCOPY, PYELOSCOPY, HOLMIUM LASER, STENT EXCHANGE;  Surgeon: Rosalind Zachary NOVAK, MD;  Location: [facility];  Service: Urology;  Laterality:  Right;  1 HR   EXTRACORPOREAL SHOCK WAVE LITHOTRIPSY Right 06/26/2021   Procedure: EXTRACORPOREAL SHOCK WAVE LITHOTRIPSY (ESWL);  Surgeon: Carolee Sherwood JONETTA DOUGLAS, MD;  Location: [facility];  Service: Urology;  Laterality: Right;   LEFT HEART CATH AND CORONARY ANGIOGRAPHY Left 10/29/2017   Procedure: LEFT HEART CATH AND CORONARY ANGIOGRAPHY;  Surgeon: Hanson Lonni, MD;  Location: [facility];  Service: Cardiovascular;  Laterality: Left;   SKIN CANCER EXCISION     TONSILLECTOMY  1965   as a child    Allergies  Allergies  Allergen Reactions   Atorvastatin  Other (See Comments)    Hands swelled   Tape     Caused redness       History of Present Illness      68 y.o. y/o male with above past medical history including atypical chest pain, nonobstructive CAD, hypertension, diastolic dysfunction, hyperlipidemia, hepatic steatosis, carcinoid tumor, obstructive sleep apnea, Raynaud's, and GERD. He previously had a negative ischemic evaluation 2016. Echo in early 2018 showed normal LV function. In early 2019, he was evaluated with complaints of exertional chest pain and underwent diagnostic catheterization revealing mild to moderate nonobstructive CAD. In the setting of lower extremity swelling, echo in March 2021 was performed and showed an EF of 60 to 65% with grade 2 diastolic dysfunction and normal RV function.  In the setting of recurrent episodic  chest pain, he underwent coronary CT angiogram in February 2024, showing a calcium  score of 180 with moderate circumflex (50-69%), and mild LAD and second diagonal disease.  FFRct analysis was normal and he was medically managed.    Ryan [name] was last seen in cardiology clinic in September 2024, at which time he was doing well.  He notes that he stays pretty active in and around his yard and does not typically experience chest pain or dyspnea.  Over the past month or so, he has noted some reduction in activity tolerance with  dyspnea and occasional lightheadedness.  He has also had 3 episodes of mild to moderate chest pressure, all occurring after exertion, without associated symptoms, lasting 2 to 3 minutes, and resolving with rest.  He has not had any symptoms at rest.  Last episode was about 2 weeks ago when he has been taking it easy since.  He denies palpitations, PND, orthopnea, syncope, edema, or early satiety.   Objective   Home Medications    Current Outpatient Medications  Medication Sig Dispense Refill   aspirin  EC 81 MG tablet Take 1 tablet (81 mg total) by mouth daily. (Patient taking differently: Take 81 mg by mouth every other day.) 90 tablet 3   losartan  (COZAAR ) 50 MG tablet TAKE 1 TABLET (50 MG TOTAL) BY MOUTH DAILY. 90 tablet 3   metoprolol  tartrate (LOPRESSOR ) 50 MG tablet Take one tablet two hours prior to the test 1 tablet 0   nitroGLYCERIN  (NITROSTAT ) 0.4 MG SL tablet Place 1 tablet (0.4 mg total) under the tongue every 5 (five) minutes as needed. 25 tablet 3   Omega-3 Fatty Acids (FISH OIL) 1000 MG CAPS Take 2,000 mg by mouth 2 (two) times daily.     rosuvastatin  (CRESTOR ) 20 MG tablet Take 1 tablet (20 mg total) by mouth daily. 90 tablet 3   vitamin E 200 UNIT capsule Take 200 Units by mouth daily.     No current facility-administered medications for this visit.     Physical Exam    VS:  BP 132/74 (BP Location: Right Arm, Patient Position: Sitting)   Pulse 64   Ht 5' 8 (1.727 m)   Wt 235 lb 3.2 oz (106.7 kg)   SpO2 98%   BMI 35.76 kg/m  , BMI Body mass index is 35.76 kg/m.          GEN: Well nourished, well developed, in no acute distress. HEENT: normal. Neck: Supple, no JVD, carotid bruits, or masses. Cardiac: RRR, no murmurs, rubs, or gallops. No clubbing, cyanosis, edema.  Radials 2+/PT 2+ and equal bilaterally.  Respiratory:  Respirations regular and unlabored, clear to auscultation bilaterally. GI: Soft, nontender, nondistended, BS + x 4. MS: no deformity or  atrophy. Skin: warm and dry, no rash. Neuro:  Strength and sensation are intact. Psych: Normal affect.  Accessory Clinical Findings    ECG personally reviewed by me today - EKG Interpretation Date/Time:  Thursday April 09 2024 11:23:50 EDT Ventricular Rate:  64 PR Interval:  184 QRS Duration:  80 QT Interval:  374 QTC Calculation: 385 R Axis:   27  Text Interpretation: Normal sinus rhythm Normal ECG Confirmed by Vivienne Bruckner [phone]) on 04/09/2024 11:29:42 AM  - no acute changes.  Lab Results  Component Value Date   WBC 5.4 10/22/2023   HGB 14.3 10/22/2023   HCT 41.9 10/22/2023   MCV 87.8 10/22/2023   PLT 207 10/22/2023   Lab Results  Component Value Date  CREATININE 1.14 10/22/2023   BUN 19 10/22/2023   NA 140 10/22/2023   K 4.5 10/22/2023   CL 106 10/22/2023   CO2 29 10/22/2023   Lab Results  Component Value Date   ALT 18 10/22/2023   AST 18 10/22/2023   ALKPHOS 60 10/22/2023   BILITOT 0.7 10/22/2023   Lab Results  Component Value Date   CHOL 121 11/29/2022   HDL 48 11/29/2022   LDLCALC 53 11/29/2022   LDLDIRECT 176 (H) 04/27/2022   TRIG 100 11/29/2022   CHOLHDL 2.5 11/29/2022    Lab Results  Component Value Date   HGBA1C 5.6 04/25/2021   Lab Results  Component Value Date   TSH 1.81 06/29/2020       Assessment & Plan    1.  Precordial chest pain/Coronary artery disease: Catheterization in 2019 with mild to moderate nonobstructive CAD.  Coronary CT angiogram in the setting of recurrent chest pain in February of 2024 showed mild to moderate nonobstructive CAD with 50 to 69% ostial stenosis of the left circumflex and normal CT FFR.  Over the past month, he has been to have having some dyspnea on exertion, reduced activity tolerance, and 3 episodes of mild chest pressure occurring after working outside.  Symptoms lasted a few minutes and resolve spontaneously.  He has had no symptoms at rest.  We discussed options for management.  We agreed to obtain  a lab work today.  Plan for repeat coronary CT angiogram.  He may continue aspirin  and statin therapy.  I did send in a prescription for sublingual nitroglycerin  as well.  Conditions  2.  Primary hypertension: Stable on ARB therapy.  3.  Hyperlipidemia: On rosuvastatin  20 mg daily.  He is overdue for lipids and I will follow-up today.  4.  Chronic HFpEF: Echo in 2021 showed EF of 60 to 65% with grade 2 diastolic dysfunction and no significant valvular disease.  He is euvolemic on examination.  Stable heart rate and blood pressure.  Continue ARB.  5.  Palpitations: Quiescent.  6.  Disposition: Follow-up basic metabolic panel and lipids today.  Follow-up coronary CT angiogram.  Follow-up in clinic in 4 to 6 weeks.  Lonni Meager, NP 04/09/2024, 11:54 AM

## 2024-04-09 NOTE — Patient Instructions (Addendum)
 Medication Instructions:  No changes *If you need a refill on your cardiac medications before your next appointment, please call your pharmacy*  Lab Work: Your provider would like for you to have the following labs today: Lipid, Direct LDL, BMET  If you have labs (blood work) drawn today and your tests are completely normal, you will receive your results only by: MyChart Message (if you have MyChart) OR A paper copy in the mail If you have any lab test that is abnormal or we need to change your treatment, we will call you to review the results.   Follow-Up: At [facility], you and your health needs are our priority.  As part of our continuing mission to provide you with exceptional heart care, our providers are all part of one team.  This team includes your primary Cardiologist (physician) and Advanced Practice Providers or APPs (Physician Assistants and Nurse Practitioners) who all work together to provide you with the care you need, when you need it.  Your next appointment:   6 week(s)  Provider:   You may see Ryan Hanson, MD or one of the following Advanced Practice Providers on your designated Care Team:   Ryan Meager, NP  We recommend signing up for the patient portal called MyChart.  Sign up information is provided on this After Visit Summary.  MyChart is used to connect with patients for Virtual Visits (Telemedicine).  Patients are able to view lab/test results, encounter notes, upcoming appointments, etc.  Non-urgent messages can be sent to your provider as well.   To learn more about what you can do with MyChart, go to [email or web address].   Other Instructions   Your cardiac CT will be scheduled at one of the below locations:   [facility] [address] [place], [place] [address] [phone]  If scheduled at [facility], please arrive to the Heart and Vascular Center 15 mins early for check-in and  test prep.  There is spacious parking and easy access to the radiology department from the [facility] entrance. Please enter here and check-in with the desk attendant.   Please follow these instructions carefully (unless otherwise directed):  An IV will be required for this test and Nitroglycerin  will be given.  Hold all erectile dysfunction medications at least 3 days (72 hrs) prior to test. (Ie viagra, cialis, sildenafil, tadalafil, etc)   On the Night Before the Test: Be sure to Drink plenty of water. Do not consume any caffeinated/decaffeinated beverages or chocolate 12 hours prior to your test. Do not take any antihistamines 12 hours prior to your test.  On the Day of the Test: Drink plenty of water until 1 hour prior to the test. Do not eat any food 1 hour prior to test. You may take your regular medications prior to the test.  Take metoprolol  (Lopressor ) two hours prior to test. Patients who wear a continuous glucose monitor MUST remove the device prior to scanning.       After the Test: Drink plenty of water. After receiving IV contrast, you may experience a mild flushed feeling. This is normal. On occasion, you may experience a mild rash up to 24 hours after the test. This is not dangerous. If this occurs, you can take Benadryl  25 mg, Zyrtec, Claritin, or Allegra and increase your fluid intake. (Patients taking Tikosyn should avoid Benadryl , and may take Zyrtec, Claritin, or Allegra) If you experience trouble breathing, this can be serious. If  it is severe call 911 IMMEDIATELY. If it is mild, please call our office.  We will call to schedule your test 2-4 weeks out understanding that some insurance companies will need an authorization prior to the service being performed.   For more information and frequently asked questions, please visit our website : [email or web address]  For non-scheduling related questions, please contact the cardiac imaging  nurse navigator should you have any questions/concerns: Cardiac Imaging Nurse Navigators Direct Office Dial: [phone]   For scheduling needs, including cancellations and rescheduling, please call [place], [phone].

## 2024-04-10 ENCOUNTER — Ambulatory Visit: Payer: Self-pay | Admitting: Nurse Practitioner

## 2024-04-10 DIAGNOSIS — I10 Essential (primary) hypertension: Secondary | ICD-10-CM

## 2024-04-10 LAB — LIPID PANEL
Chol/HDL Ratio: 2.7 ratio (ref 0.0–5.0)
Cholesterol, Total: 117 mg/dL (ref 100–199)
HDL: 44 mg/dL (ref >39)
LDL Chol Calc (NIH): 53 mg/dL (ref 0–99)
Triglycerides: 111 mg/dL (ref 0–149)
VLDL Cholesterol Cal: 20 mg/dL (ref 5–40)

## 2024-04-10 LAB — LDL CHOLESTEROL, DIRECT: LDL Direct: 53 mg/dL (ref 0–99)

## 2024-04-10 LAB — BASIC METABOLIC PANEL WITH GFR
BUN/Creatinine Ratio: 12 (ref 10–24)
BUN: 15 mg/dL (ref 8–27)
CO2: 23 mmol/L (ref 20–29)
Calcium: 9.8 mg/dL (ref 8.6–10.2)
Chloride: 101 mmol/L (ref 96–106)
Creatinine, Ser: 1.29 mg/dL — ABNORMAL HIGH (ref 0.76–1.27)
Glucose: 96 mg/dL (ref 70–99)
Potassium: 4.6 mmol/L (ref 3.5–5.2)
Sodium: 139 mmol/L (ref 134–144)
eGFR: 61 mL/min/1.73 (ref >59)

## 2024-04-13 ENCOUNTER — Encounter (HOSPITAL_COMMUNITY): Payer: Self-pay

## 2024-04-14 ENCOUNTER — Telehealth (HOSPITAL_COMMUNITY): Payer: Self-pay | Admitting: *Deleted

## 2024-04-14 NOTE — Telephone Encounter (Signed)
 Reaching out to patient to offer assistance regarding upcoming cardiac imaging study; pt verbalizes understanding of appt date/time, parking situation and where to check in, pre-test NPO status and medications ordered, and verified current allergies; name and call back number provided for further questions should they arise Sid Seats RN Navigator Cardiac Imaging [facility] [phone] office [phone] cell

## 2024-04-15 ENCOUNTER — Ambulatory Visit (HOSPITAL_COMMUNITY)
Admission: RE | Admit: 2024-04-15 | Discharge: 2024-04-15 | Disposition: A | Discharge status: Home / Self Care | Source: Ambulatory Visit | Attending: Nurse Practitioner | Admitting: Nurse Practitioner

## 2024-04-15 DIAGNOSIS — R072 Precordial pain: Secondary | ICD-10-CM | POA: Diagnosis present

## 2024-04-15 MED ORDER — IOHEXOL 350 MG/ML SOLN
100.0000 mL | Freq: Once | INTRAVENOUS | Status: AC | PRN
  Administered 2024-04-15: 100 mL via INTRAVENOUS

## 2024-04-15 MED ORDER — NITROGLYCERIN 0.4 MG SL SUBL
0.8000 mg | SUBLINGUAL_TABLET | Freq: Once | SUBLINGUAL | Status: AC
  Administered 2024-04-15: 0.8 mg via SUBLINGUAL

## 2024-04-16 ENCOUNTER — Ambulatory Visit (HOSPITAL_COMMUNITY)
Admission: RE | Admit: 2024-04-16 | Discharge: 2024-04-16 | Disposition: A | Discharge status: Home / Self Care | Source: Ambulatory Visit | Attending: Nurse Practitioner | Admitting: Nurse Practitioner

## 2024-04-16 DIAGNOSIS — C7A012 Malignant carcinoid tumor of the ileum: Secondary | ICD-10-CM | POA: Diagnosis present

## 2024-04-16 MED ORDER — IOHEXOL 9 MG/ML PO SOLN
1000.0000 mL | ORAL | Status: AC
  Administered 2024-04-16: 1000 mL via ORAL

## 2024-04-16 MED ORDER — IOHEXOL 300 MG/ML  SOLN
100.0000 mL | Freq: Once | INTRAMUSCULAR | Status: AC | PRN
  Administered 2024-04-16: 100 mL via INTRAVENOUS

## 2024-04-17 ENCOUNTER — Other Ambulatory Visit
Admission: RE | Admit: 2024-04-17 | Discharge: 2024-04-17 | Disposition: A | Discharge status: Home / Self Care | Source: Ambulatory Visit | Attending: Nurse Practitioner | Admitting: Nurse Practitioner

## 2024-04-17 ENCOUNTER — Ambulatory Visit: Payer: Self-pay | Admitting: Nurse Practitioner

## 2024-04-17 DIAGNOSIS — I10 Essential (primary) hypertension: Secondary | ICD-10-CM | POA: Insufficient documentation

## 2024-04-17 LAB — BASIC METABOLIC PANEL WITH GFR
Anion gap: 9 (ref 5–15)
BUN: 18 mg/dL (ref 8–23)
CO2: 28 mmol/L (ref 22–32)
Calcium: 9.5 mg/dL (ref 8.9–10.3)
Chloride: 103 mmol/L (ref 98–111)
Creatinine, Ser: 1.07 mg/dL (ref 0.61–1.24)
GFR, Estimated: >60 mL/min (ref >60)
Glucose, Bld: 101 mg/dL — ABNORMAL HIGH (ref 70–99)
Potassium: 4.4 mmol/L (ref 3.5–5.1)
Sodium: 140 mmol/L (ref 135–145)

## 2024-04-21 ENCOUNTER — Other Ambulatory Visit: Payer: Self-pay

## 2024-04-21 DIAGNOSIS — C7A012 Malignant carcinoid tumor of the ileum: Secondary | ICD-10-CM

## 2024-04-21 DIAGNOSIS — D3A012 Benign carcinoid tumor of the ileum: Secondary | ICD-10-CM

## 2024-04-21 NOTE — Assessment & Plan Note (Addendum)
 grade 1, pT2N1, stage III, Ki-67 < 1% -presented with hematuria. CT AP 12/02/20 showed incidental 1.9 cm ileum/small bowel mass. CT chest 01/02/21 negative for metastatic disease.  -colonoscopy on 02/15/21 under Dr. Abran, path showed low grade neuroendocrine tumor.   -s/p partial colectomy by Dr. 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 Staged pT2pN1 -Baseline chromogranin A and 24hr urine from 06/22/21 were normal -surveillance DOTATATE PET on 04/12/22 showed NED. -03/2023 - Surveillance CT scan of chest, abdomen pelvis showed no evidence of recurrence, he has a few 4 to 5 mm lung nodules, likely benign.  10/22/2023 - clinically stable with CBC and CMP within normal limits. Awaiting results of chromogranin A. New CT Cad with calcium  scoring done on 04/15/2024.  This shows stable hepatic cysts versus hemangiomas. No visible evidence of cancer recurrence or metastatic  disease.  CT CAP results  are pending.  CEA is normal. Patient is clinically doing well and exam is benign.  Plan for labs and follow up in 6 months. Will extend to annual visits after that.

## 2024-04-21 NOTE — Progress Notes (Signed)
 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 CT AP 12/02/20 showed incidental 1.9 cm ileum/small bowel mass. CT chest 01/02/21 negative for metastatic disease.  -colonoscopy on 02/15/21 under Dr. Abran, path showed low grade neuroendocrine tumor.   -s/p partial colectomy by Dr. 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 Staged pT2pN1 -Baseline chromogranin A and 24hr urine from 06/22/21 were normal -surveillance DOTATATE PET on 04/12/22 showed NED. -03/2023 - Surveillance CT scan of chest, abdomen pelvis showed no evidence of recurrence, he has a few 4 to 5 mm lung nodules, likely benign.  10/22/2023 - clinically stable with CBC and CMP within normal limits. Awaiting results of chromogranin A. New CT Cad with calcium  scoring done on 04/15/2024.  This shows stable hepatic cysts versus hemangiomas. No visible evidence of cancer recurrence or metastatic  disease.  CT CAP results  are pending.  CEA is normal. Patient is clinically doing well and exam is benign.  Plan for labs and follow up in 6 months. Will extend to annual visits after that.    Plan Labs reviewed. -Unremarkable CBC and CMP. - CEA normal at 48.7. CT CAD from 04/15/2024 is stable with no  evidence of recurrence or metastatic disease.  Hepatic cysts or hemangiomas present and stable. Awaiting results of CT CAP with contrast.  Repeat CT CAP in 1 year. Labs and follow-up in 6 months, sooner if needed.  Will plan for annual visits after that.   The patient understands the plans discussed today and is in agreement with them.  He knows to contact our office if he develops concerns prior to his next appointment.  I provided 20 minutes of face-to-face time during this encounter and > 50% was spent counseling as documented under my assessment and plan.    Powell FORBES Lessen, NP  [facility] - A DEPT OF [facility] [address] [place] [address] Dept: [phone] Dept Fax: [phone]   No orders of the defined types were placed in this encounter.     CHIEF COMPLAINT:  CC: Malignant carcinoid tumor of the ileum  Current Treatment: Surveillance  INTERVAL HISTORY:  Ryan [name] is here today for repeat clinical assessment.  He was last seen by me on 10/22/2023.  He had CT CAP done 04/16/2024.  There is stable, 2 mm anterior right middle lobe pulmonary nodule.  There are stable hepatic cysts/hemangiomas.  Patient reports feeling well.  States his appetite is good.  Denies abdominal pain.  Denies nausea, vomiting, diarrhea, constipation.  He denies chest pain, chest pressure, or shortness of breath. He denies headaches or visual disturbances. He denies fevers or chills. He denies pain. His weight has been stable.  I have reviewed the past medical history, past surgical history, social history and family history with the patient and  they are unchanged from previous note.  ALLERGIES:  is allergic to atorvastatin  and tape.  MEDICATIONS:  Current Outpatient Medications  Medication Sig Dispense Refill   aspirin  EC 81 MG tablet Take 1 tablet (81 mg total) by mouth daily. (Patient taking differently: Take 81 mg by mouth  every other day.) 90 tablet 3   losartan  (COZAAR ) 50 MG tablet TAKE 1 TABLET (50 MG TOTAL) BY MOUTH DAILY. 90 tablet 3   metoprolol  tartrate (LOPRESSOR ) 50 MG tablet Take one tablet two hours prior to the test 1 tablet 0   nitroGLYCERIN  (NITROSTAT ) 0.4 MG SL tablet Place 1 tablet (0.4 mg total) under the tongue every 5 (five) minutes as needed. 25 tablet 3   Omega-3 Fatty Acids (FISH OIL) 1000 MG CAPS Take 2,000 mg by mouth 2 (two) times daily.     rosuvastatin  (CRESTOR ) 20 MG tablet Take 1 tablet (20 mg total) by mouth daily. 90 tablet 3   vitamin E 200 UNIT capsule Take 200 Units by mouth daily.     No current facility-administered medications for this visit.     REVIEW OF SYSTEMS:   Constitutional: Denies fevers, chills or abnormal weight loss Eyes: Denies blurriness of vision Ears, nose, mouth, throat, and face: Denies mucositis or sore throat Respiratory: Denies cough, dyspnea or wheezes Cardiovascular: Denies palpitation, chest discomfort or lower extremity swelling Gastrointestinal:  Denies nausea, heartburn or change in bowel habits Skin: Denies abnormal skin rashes Lymphatics: Denies new lymphadenopathy or easy bruising Neurological:Denies numbness, tingling or new weaknesses Behavioral/Psych: Mood is stable, no new changes  All other systems were reviewed with the patient and are negative.   VITALS:   Today's Vitals   04/22/24 1023 04/22/24 1030  BP: 138/70   Pulse: 99   Resp: 17   Temp: 97.8 F (36.6 C)   SpO2: 99%   Weight: 234 lb 11.2 oz (106.5 kg)   PainSc:  0-No pain   Body mass index is 35.69 kg/m.   Wt Readings from Last 3 Encounters:  04/22/24 234 lb 11.2 oz (106.5 kg)  04/09/24 235 lb 3.2 oz (106.7 kg)  12/06/23 233 lb 9.6 oz (106 kg)    Body mass index is 35.69 kg/m.  Performance status (ECOG): 0 - Asymptomatic  PHYSICAL EXAM:   GENERAL:alert, no distress and comfortable SKIN: skin color, texture, turgor are normal, no rashes or significant  lesions EYES: normal, Conjunctiva are pink and non-injected, sclera clear OROPHARYNX:no exudate, no erythema and lips, buccal mucosa, and tongue normal  NECK: supple, thyroid  normal size, non-tender, without nodularity LYMPH:  no palpable lymphadenopathy in the cervical, axillary or inguinal LUNGS: clear to auscultation and percussion with normal breathing effort HEART: regular rate & rhythm and no murmurs and no lower extremity edema ABDOMEN:abdomen soft, non-tender and normal bowel sounds Musculoskeletal:no cyanosis of digits and no clubbing  NEURO: alert & oriented x 3 with fluent speech, no focal motor/sensory deficits  LABORATORY DATA:  I have reviewed the data as listed    Component Value Date/Time   NA 139 04/22/2024 1010   NA 139 04/09/2024 1205   K 4.4 04/22/2024 1010   CL 105 04/22/2024 1010   CO2 30 04/22/2024 1010   GLUCOSE 106 (H) 04/22/2024 1010   BUN 16 04/22/2024 1010   BUN 15 04/09/2024 1205   CREATININE 1.25 (H) 04/22/2024 1010   CREATININE 1.23 06/29/2020 1234   CALCIUM  9.8 04/22/2024 1010   PROT 7.2 04/22/2024 1010   PROT 7.1 09/20/2021 0829  ALBUMIN 4.3 04/22/2024 1010   ALBUMIN 4.6 09/20/2021 0829   AST 25 04/22/2024 1010   ALT 18 04/22/2024 1010   ALKPHOS 61 04/22/2024 1010   BILITOT 0.8 04/22/2024 1010   GFRNONAA >60 04/22/2024 1010   GFRAA 74 12/02/2019 1512     Lab Results  Component Value Date   WBC 5.1 04/22/2024   NEUTROABS 3.1 04/22/2024   HGB 15.8 04/22/2024   HCT 44.4 04/22/2024   MCV 85.4 04/22/2024   PLT 181 04/22/2024     RADIOGRAPHIC STUDIES: CT CORONARY MORPH W/CTA COR W/SCORE W/CA W/CM &/OR WO/CM Addendum Date: 04/18/2024 ADDENDUM REPORT: 04/18/2024 01:22 EXAM: OVER-READ INTERPRETATION  CT CHEST The following report is an over-read performed by radiologist Dr. Suzen Dials of [facility] on 04/18/2024. This over-read does not include interpretation of cardiac or coronary anatomy or pathology. The coronary  calcium  score/coronary CTA interpretation by the cardiologist is attached. COMPARISON:  April 03, 2023 FINDINGS: Cardiovascular: There are no significant extracardiac vascular findings. Mediastinum/Nodes: There are no enlarged lymph nodes within the visualized mediastinum. Lungs/Pleura: There is no pleural effusion. A stable 2 mm anterior right middle lobe pulmonary nodule is seen (axial CT image 71, CT series 304). Upper abdomen: Stable 7 mm and 14 mm hepatic cyst versus hemangioma are seen within the anterior aspect of the liver dome. Musculoskeletal/Chest wall: No chest wall mass or suspicious osseous findings within the visualized chest. IMPRESSION: 1. Stable 2 mm anterior right middle lobe pulmonary nodule. No follow-up needed if patient is low-risk.This recommendation follows the consensus statement: Guidelines for Management of Incidental Pulmonary Nodules Detected on CT Images: From the Fleischner Society 2017; Radiology 2017; 284:228-243. 2. Stable hepatic cysts versus hemangiomas. Electronically Signed   By: Suzen Dials M.D.   On: 04/18/2024 01:22   Result Date: 04/18/2024 CLINICAL DATA:  68 yo male with chest pain, nonspecific EXAM: Cardiac/Coronary CTA TECHNIQUE: A non-contrast, gated CT scan was obtained with axial slices of 2.5 mm through the heart for calcium  scoring. Calcium  scoring was performed using the Agatston method. A 120 kV prospective, gated, contrast cardiac CT scan was obtained. Gantry rotation speed was 230 msec and collimation was 0.63 mm. Two sublingual nitroglycerin  tablets (0.8 mg) were given. The 3D data set was reconstructed with motion correction for the best systolic or diastolic phase. Images were analyzed on a dedicated workstation using MPR, MIP, and VRT modes. The patient received OMNIPAQUE  IOHEXOL  350 MG/ML SOLN of contrast. FINDINGS: Image quality: Excellent. Noise artifact is: Limited. Coronary arteries: Normal coronary origins.  Right dominance. Right  Coronary Artery: Dominant. No disease. Normal R-PLB and R-PDA branches. Left Main Coronary Artery: Normal. Bifurcates into the LAD and LCx arteries. Left Anterior Descending Coronary Artery: Large anterior artery that wraps around the apex. Minimal mixed ostial/proximal stenosis 1-24%. D1 branch without disease. D2 branch with minimal proximal calcification and no stenosis. Left Circumflex Artery: AV groove vessel, minimal 1-24% mixed ostial stenosis. Aorta: Normal size, 30 mm at the mid ascending aorta (level of the PA bifurcation) measured double oblique. Aortic atherosclerosis. No dissection. Aortic Valve: Trileaflet. No calcifications. Other findings: Normal pulmonary vein drainage into the left atrium. Normal left atrial appendage without a thrombus. Normal size of the pulmonary artery. Extra-cardiac findings: See attached radiology report for non-cardiac structures. IMPRESSION: 1. Minimal mixed non-obstructive CAD, CADRADS = 1. 2. Normal coronary origin with right dominance. 3. Coronary artery calcium  score is 206, which places the patient in the 65th percentile for age/race and sex-matched controls (MESA).  4. Total plaque volume 220 mm3, of which 39 mm3 (18%) is calcified plaque and 124mm3 (82%) is non-calcified plaque. TPV is moderate. 5. Aortic atherosclerosis. 6. Risk factor modification including lipid-lowering to target LDL < 70 is recommended. RECOMMENDATIONS: 1. CAD-RADS 0: No evidence of CAD (0%). Consider non-atherosclerotic causes of chest pain. 2. CAD-RADS 1: Minimal non-obstructive CAD (0-24%). Consider non-atherosclerotic causes of chest pain. Consider preventive therapy and risk factor modification. 3. CAD-RADS 2: Mild non-obstructive CAD (25-49%). Consider non-atherosclerotic causes of chest pain. Consider preventive therapy and risk factor modification. 4. CAD-RADS 3: Moderate stenosis. Consider symptom-guided anti-ischemic pharmacotherapy as well as risk factor modification per guideline  directed care. Additional analysis with CT FFR will be submitted. 5. CAD-RADS 4: Severe stenosis. (70-99% or > 50% left main). Cardiac catheterization or CT FFR is recommended. Consider symptom-guided anti-ischemic pharmacotherapy as well as risk factor modification per guideline directed care. Invasive coronary angiography recommended with revascularization per published guideline statements. 6. CAD-RADS 5: Total coronary occlusion (100%). Consider cardiac catheterization or viability assessment. Consider symptom-guided anti-ischemic pharmacotherapy as well as risk factor modification per guideline directed care. 7. CAD-RADS N: Non-diagnostic study. Obstructive CAD can't be excluded. Alternative evaluation is recommended. Electronically Signed: By: Vinie JAYSON Maxcy M.D. On: 04/16/2024 09:20

## 2024-04-22 ENCOUNTER — Telehealth: Payer: Self-pay | Admitting: Nurse Practitioner

## 2024-04-22 ENCOUNTER — Inpatient Hospital Stay: Payer: Medicare Other | Attending: Nurse Practitioner

## 2024-04-22 ENCOUNTER — Inpatient Hospital Stay (HOSPITAL_BASED_OUTPATIENT_CLINIC_OR_DEPARTMENT_OTHER): Payer: Medicare Other | Admitting: Nurse Practitioner

## 2024-04-22 VITALS — BP 138/70 | HR 99 | Temp 97.8°F | Resp 17 | Wt 234.7 lb

## 2024-04-22 DIAGNOSIS — C7A012 Malignant carcinoid tumor of the ileum: Secondary | ICD-10-CM | POA: Insufficient documentation

## 2024-04-22 DIAGNOSIS — D3A012 Benign carcinoid tumor of the ileum: Secondary | ICD-10-CM

## 2024-04-22 LAB — CBC WITH DIFFERENTIAL (CANCER CENTER ONLY)
Abs Immature Granulocytes: 0.02 K/uL (ref 0.00–0.07)
Basophils Absolute: 0 K/uL (ref 0.0–0.1)
Basophils Relative: 1 %
Eosinophils Absolute: 0.2 K/uL (ref 0.0–0.5)
Eosinophils Relative: 3 %
HCT: 44.4 % (ref 39.0–52.0)
Hemoglobin: 15.8 g/dL (ref 13.0–17.0)
Immature Granulocytes: 0 %
Lymphocytes Relative: 27 %
Lymphs Abs: 1.4 K/uL (ref 0.7–4.0)
MCH: 30.4 pg (ref 26.0–34.0)
MCHC: 35.6 g/dL (ref 30.0–36.0)
MCV: 85.4 fL (ref 80.0–100.0)
Monocytes Absolute: 0.5 K/uL (ref 0.1–1.0)
Monocytes Relative: 9 %
Neutro Abs: 3.1 K/uL (ref 1.7–7.7)
Neutrophils Relative %: 60 %
Platelet Count: 181 K/uL (ref 150–400)
RBC: 5.2 MIL/uL (ref 4.22–5.81)
RDW: 11.8 % (ref 11.5–15.5)
WBC Count: 5.1 K/uL (ref 4.0–10.5)
nRBC: 0 % (ref 0.0–0.2)

## 2024-04-22 LAB — CMP (CANCER CENTER ONLY)
ALT: 18 U/L (ref 0–44)
AST: 25 U/L (ref 15–41)
Albumin: 4.3 g/dL (ref 3.5–5.0)
Alkaline Phosphatase: 61 U/L (ref 38–126)
Anion gap: 4 — ABNORMAL LOW (ref 5–15)
BUN: 16 mg/dL (ref 8–23)
CO2: 30 mmol/L (ref 22–32)
Calcium: 9.8 mg/dL (ref 8.9–10.3)
Chloride: 105 mmol/L (ref 98–111)
Creatinine: 1.25 mg/dL — ABNORMAL HIGH (ref 0.61–1.24)
GFR, Estimated: >60 mL/min (ref >60)
Glucose, Bld: 106 mg/dL — ABNORMAL HIGH (ref 70–99)
Potassium: 4.4 mmol/L (ref 3.5–5.1)
Sodium: 139 mmol/L (ref 135–145)
Total Bilirubin: 0.8 mg/dL (ref 0.0–1.2)
Total Protein: 7.2 g/dL (ref 6.5–8.1)

## 2024-04-22 NOTE — Telephone Encounter (Signed)
 Scheduled appointments per 8/27 los. Talked with the patient and he is aware of the made appointments.

## 2024-04-23 LAB — CHROMOGRANIN A: Chromogranin A (ng/mL): 48.7 ng/mL (ref 0.0–101.8)

## 2024-04-25 ENCOUNTER — Encounter: Payer: Self-pay | Admitting: Nurse Practitioner

## 2024-05-20 ENCOUNTER — Other Ambulatory Visit: Payer: Self-pay | Admitting: Nurse Practitioner

## 2024-05-21 ENCOUNTER — Encounter: Payer: Self-pay | Admitting: Nurse Practitioner

## 2024-05-21 ENCOUNTER — Ambulatory Visit: Attending: Nurse Practitioner | Admitting: Nurse Practitioner

## 2024-05-21 VITALS — BP 120/64 | HR 60 | Ht 69.0 in | Wt 235.4 lb

## 2024-05-21 DIAGNOSIS — I5032 Chronic diastolic (congestive) heart failure: Secondary | ICD-10-CM | POA: Diagnosis present

## 2024-05-21 DIAGNOSIS — R002 Palpitations: Secondary | ICD-10-CM | POA: Diagnosis present

## 2024-05-21 DIAGNOSIS — E785 Hyperlipidemia, unspecified: Secondary | ICD-10-CM | POA: Insufficient documentation

## 2024-05-21 DIAGNOSIS — I10 Essential (primary) hypertension: Secondary | ICD-10-CM | POA: Insufficient documentation

## 2024-05-21 DIAGNOSIS — I2510 Atherosclerotic heart disease of native coronary artery without angina pectoris: Secondary | ICD-10-CM | POA: Insufficient documentation

## 2024-05-21 NOTE — Progress Notes (Signed)
 Office Visit    Patient Name: Ryan [name]. Date of Encounter: 05/21/2024  Primary Care Provider:  Daryl Setter, NP Primary Cardiologist:  Ryan Hanson, MD    Chief Complaint    68 y.o. male with a history of atypical chest pain, nonobstructive CAD, hypertension, hyperlipidemia, hepatic steatosis, and GERD, who presents for f/u related to CAD.   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 10/2019 Echo: EF 60-65%, no rwma, GrII DD, nl RV fxn. Mildly dil LA.   Fatty liver    fatty liver disease   History of indigestion    History of kidney stones    Hyperlipidemia    Hypertension    Non-obstructive CAD (coronary artery disease)    a. 2016 Neg MV; b. 10/2017 Cath: LM nl, LAD 78m, D1/2/3 nl, LCX 20p, RCA nl, EF 50-55%; c. 09/2022 Cor CTA: Ca2+ = 180 (68th %'ile). LAD 0-24p, D2 0-24%, LCX 50-69ost (CTFFR normal); d. 03/2024 Cor CTA: LMnl, LAD nl, D1 1-24% ost/p, D2 min prox Ca2+, LCX 1-24% ost, RCA nl.   Pulmonary nodule    a. 03/2024 CTA chest: Stable 2 mm anterior right middle lobe pulmonary nodule.   Sleep apnea    C-PAP   SOB (shortness of breath) 2019   after climbing stairs   Past Surgical History:  Procedure Laterality Date   COLONOSCOPY  01/2021   CYSTOSCOPY W/ URETERAL STENT PLACEMENT Right 07/04/2021   Procedure: CYSTOSCOPY WITH RETROGRADE PYELOGRAM/DIAGNOSTIC URETEROSCOPY/URETERAL STENT PLACEMENT;  Surgeon: Ryan Morene ORN, MD;  Location: [facility];  Service: Urology;  Laterality: Right;   CYSTOSCOPY/URETEROSCOPY/HOLMIUM LASER/STENT  PLACEMENT Right 07/25/2021   Procedure: CYSTOSCOPY, URETEROSCOPY, PYELOSCOPY, HOLMIUM LASER, STENT EXCHANGE;  Surgeon: Ryan Zachary NOVAK, MD;  Location: [facility];  Service: Urology;  Laterality: Right;  1 HR   EXTRACORPOREAL SHOCK WAVE LITHOTRIPSY Right 06/26/2021   Procedure: EXTRACORPOREAL SHOCK WAVE LITHOTRIPSY (ESWL);  Surgeon: Ryan Sherwood JONETTA DOUGLAS, MD;  Location: [name];  Service: Urology;  Laterality: Right;   LEFT HEART CATH AND CORONARY ANGIOGRAPHY Left 10/29/2017   Procedure: LEFT HEART CATH AND CORONARY ANGIOGRAPHY;  Surgeon: [name] Lonni, MD;  Location: [facility];  Service: Cardiovascular;  Laterality: Left;   SKIN CANCER EXCISION     TONSILLECTOMY  1965   as a child    Allergies  Allergies  Allergen Reactions   Atorvastatin  Other (See Comments)    Hands swelled   Tape     Caused redness       History of Present Illness      68 y.o. y/o male with above past medical history including atypical chest pain, nonobstructive CAD, hypertension, diastolic dysfunction, hyperlipidemia, hepatic steatosis, carcinoid tumor, obstructive sleep apnea, Raynaud's, and GERD. He previously had a negative ischemic evaluation 2016. Echo in early 2018 showed normal LV function. In early 2019, he was evaluated with complaints of exertional chest pain and underwent diagnostic catheterization revealing mild to moderate  nonobstructive CAD. In the setting of lower extremity swelling, echo in March 2021 was performed and showed an EF of 60 to 65% with grade 2 diastolic dysfunction and normal RV function.  In the setting of recurrent episodic chest pain, he underwent coronary CT angiogram in February 2024, showing a calcium  score of 180 with moderate circumflex (50-69%), and mild LAD and second diagonal disease.  FFRct analysis was normal and he was medically managed.     Mr.  Justen was last seen in cardiology clinic in 03/2024 at which time he reported some reduction in  activity tolerance, dyspnea, occasional lightheadedness, and 3 episodes of mild to moderate chest pressure occurring after activity.  Repeat coronary CTA showed calcium  score of 206 (65th percentile), and minimal 1 to 24% diagonal and ostial circumflex plaque without significant stenosis.  Radiology overread was notable for stable 2 mm anterior right middle lobe pulmonary nodule.  Since his last visit, he has been feeling well without any recurrence of chest pain.  He remains active.  He denies dyspnea, palpitations, PND, orthopnea, dizziness, syncope, edema, or early satiety. Objective   Home Medications    Current Outpatient Medications  Medication Sig Dispense Refill   aspirin  EC 81 MG tablet Take 81 mg by mouth every other day. Swallow whole.     losartan  (COZAAR ) 50 MG tablet TAKE 1 TABLET (50 MG TOTAL) BY MOUTH DAILY. 90 tablet 3   nitroGLYCERIN  (NITROSTAT ) 0.4 MG SL tablet Place 1 tablet (0.4 mg total) under the tongue every 5 (five) minutes as needed. 25 tablet 3   Omega-3 Fatty Acids (FISH OIL) 1000 MG CAPS Take 2,000 mg by mouth 2 (two) times daily.     rosuvastatin  (CRESTOR ) 20 MG tablet TAKE 1 TABLET (20 MG TOTAL) BY MOUTH DAILY. 90 tablet 3   No current facility-administered medications for this visit.     Physical Exam    VS:  BP 120/64 (BP Location: Left Arm, Patient Position: Sitting, Cuff Size: Normal)   Pulse 60   Ht 5' 9 (1.753 m)   Wt 235 lb 6 oz (106.8 kg)   SpO2 98%   BMI 34.76 kg/m  , BMI Body mass index is 34.76 kg/m.          GEN: Well nourished, well developed, in no acute distress. HEENT: normal. Neck: Supple, no JVD, carotid bruits, or masses. Cardiac: RRR, no murmurs, rubs, or gallops. No clubbing, cyanosis, edema.  Radials 2+/PT 2+ and equal bilaterally.  Respiratory:  Respirations regular and unlabored, clear to auscultation bilaterally. GI: Soft, nontender, nondistended, BS + x 4. MS: no deformity or atrophy. Skin: warm and dry, no rash. Neuro:   Strength and sensation are intact. Psych: Normal affect.  Accessory Clinical Findings    Lab Results  Component Value Date   WBC 5.1 04/22/2024   HGB 15.8 04/22/2024   HCT 44.4 04/22/2024   MCV 85.4 04/22/2024   PLT 181 04/22/2024   Lab Results  Component Value Date   CREATININE 1.25 (H) 04/22/2024   BUN 16 04/22/2024   NA 139 04/22/2024   K 4.4 04/22/2024   CL 105 04/22/2024   CO2 30 04/22/2024   Lab Results  Component Value Date   ALT 18 04/22/2024   AST 25 04/22/2024   ALKPHOS 61 04/22/2024   BILITOT 0.8 04/22/2024   Lab Results  Component Value Date   CHOL 117 04/09/2024   HDL 44 04/09/2024   LDLCALC 53 04/09/2024   LDLDIRECT 53 04/09/2024  TRIG 111 04/09/2024   CHOLHDL 2.7 04/09/2024    Lab Results  Component Value Date   HGBA1C 5.6 04/25/2021   Lab Results  Component Value Date   TSH 1.81 06/29/2020       Assessment & Plan    1.  Precordial chest pain/CAD: Catheterization 2019 with mild to moderate nonobstructive CAD.  Coronary CT angiogram in the setting of recurrent chest pain appropriate 2024 showed mild to moderate nonobstructive CAD with normal FFRct.  He was seen last month with recurrent episodic chest pain and dyspnea and we agreed to pursue repeat coronary CTA which again showed nonobstructive CAD.  He has had no recurrence of chest pain or dyspnea.  Reassurance provided.  He remains on aspirin  and statin therapy.  2.  Primary hypertension: Stable on ARB therapy.  3.  Hyperlipidemia: LDL of 53 w/ nl LFTs on f/u last month.  Cont statin rx.  4.  Chronic HFpEF:  EF 60-65% w/ grade 2 diastolic dysfunction.  He remains euvolemic on exam with stable heart rate and blood pressure.  5.  Palpitations: Quiescent.  6.  Disposition: Follow-up in 6 months or sooner if necessary.  Ryan Meager, NP 05/21/2024, 9:39 AM

## 2024-05-21 NOTE — Patient Instructions (Signed)
 Medication Instructions:  Your physician recommends that you continue on your current medications as directed. Please refer to the Current Medication list given to you today.  *If you need a refill on your cardiac medications before your next appointment, please call your pharmacy*  Lab Work: No labs ordered today  If you have labs (blood work) drawn today and your tests are completely normal, you will receive your results only by: MyChart Message (if you have MyChart) OR A paper copy in the mail If you have any lab test that is abnormal or we need to change your treatment, we will call you to review the results.  Testing/Procedures: No test ordered today   Follow-Up: At [facility], you and your health needs are our priority.  As part of our continuing mission to provide you with exceptional heart care, our providers are all part of one team.  This team includes your primary Cardiologist (physician) and Advanced Practice Providers or APPs (Physician Assistants and Nurse Practitioners) who all work together to provide you with the care you need, when you need it.  Your next appointment:   6 month(s)  Provider:  Lonni Hanson, MD   We recommend signing up for the patient portal called MyChart.  Sign up information is provided on this After Visit Summary.  MyChart is used to connect with patients for Virtual Visits (Telemedicine).  Patients are able to view lab/test results, encounter notes, upcoming appointments, etc.  Non-urgent messages can be sent to your provider as well.   To learn more about what you can do with MyChart, go to [email or web address].

## 2024-06-16 ENCOUNTER — Ambulatory Visit: Payer: Self-pay

## 2024-06-16 NOTE — Telephone Encounter (Signed)
 FYI Only or Action Required?: Action required by provider: request for appointment and clinical question for provider.  Patient was last seen in primary care on 01/07/2023 by Daryl Setter, NP.  Called Nurse Triage reporting No chief complaint on file..  Symptoms began yesterday.  Interventions attempted: Nothing.  Symptoms are: unchanged.  Triage Disposition: See Physician Within 24 Hours  Patient/caregiver understands and will follow disposition?: Yes  Copied from CRM [id number]. Topic: Clinical - Red Word Triage >> Jun 16, 2024  8:08 AM Mia F wrote: Red Word that prompted transfer to Nurse Triage: Lump under right arm. Between elbow and shoulder. Was bit by something last week which shows its healing but the bump was just discovered and is painful. Just noticed last night. Not open and does not have any fluid coming out. Painful to touch and when not touched. Reason for Disposition  Boil > 2 inches across (> 5 cm; larger than a golf ball or ping pong ball)  Answer Assessment - Initial Assessment Questions 1. APPEARANCE of BOIL: What does the boil look like?      Flesh Colored, Under the Skin  2. LOCATION: Where is the boil located?      Between the Elbow and the Shoulder, Underside of the Left Arm  3. NUMBER: How many boils are there?      1  4. SIZE: How big is the boil? (e.g., inches, cm; compare to size of a coin or other object)     3 inches wide  5. ONSET: When did the boil start?     Yesterday  6. PAIN: Is there any pain? If Yes, ask: How bad is the pain?   (Scale 1-10; or mild, moderate, severe)     Yes, Tender to touch, and with extension of the arm  7. FEVER: Do you have a fever? If Yes, ask: What is it, how was it measured, and when did it start?      No  8. SOURCE: Have you been around anyone with boils or other Staph infections? Have you ever had boils before?     No  9. OTHER SYMPTOMS: Do you have any other symptoms? (e.g.,  shaking chills, weakness, rash elsewhere on body)     No  Protocols used: Boil (Skin Abscess)-A-AH

## 2024-06-16 NOTE — Progress Notes (Unsigned)
 [facility] [address] [place], [place] [address] [phone]  Date:  06/17/2024   Name:  Ryan [name].   DOB:  05/07/1956   MRN:  [id number]  PCP:  Daryl Setter, NP    Chief Complaint: No chief complaint on file.   History of Present Illness:  Ryan [name]. is a 68 y.o. very pleasant male patient who presents with the following:  Primary patient of my partner Setter Jester, here today with concern of a boil He has a few chronic issues including CHF, sleep apnea, hyperlipidemia, hypertension  -Flu shot  Discussed the use of AI scribe software for clinical note transcription with the patient, who gave verbal consent to proceed.  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 05/10/2021 05/10/2021   Lung nodule 04/07/2021   Chronic heart failure with preserved ejection fraction (HFpEF) (HCC) 01/01/2020   Coronary artery disease involving native coronary artery of native heart without angina pectoris 01/01/2020   Diastolic CHF (HCC) 11/06/2019   Leg swelling 10/23/2019   Obstructive sleep apnea on CPAP 10/22/2019   Unstable angina (HCC) 10/23/2017   Raynaud's disease 11/23/2012   Annual physical exam 05/25/2012   Family history of colon cancer 09/20/2011   GERD (gastroesophageal reflux disease) 09/20/2011   BACK PAIN, LUMBAR 10/24/2010   TOBACCO ABUSE 08/09/2009   Hyperlipidemia LDL goal <70 11/25/2007   Essential hypertension 11/25/2007   FATTY LIVER DISEASE 11/25/2007    Past Medical History:  Diagnosis Date   Abnormal liver function    history of    Atypical chest pain    Blind left eye    blind left eye since birth   Cancer [facility])    skin:  basal cell carcinoma/ on face,back and neck   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 10/2019 Echo: EF 60-65%, no rwma, GrII DD, nl RV fxn. Mildly dil LA.   Fatty liver    fatty liver disease   History of indigestion    History of kidney stones    Hyperlipidemia    Hypertension    Non-obstructive CAD (coronary artery disease)    a. 2016 Neg MV; b. 10/2017 Cath: LM nl, LAD 71m, D1/2/3 nl, LCX 20p, RCA nl, EF 50-55%; c. 09/2022 Cor CTA: Ca2+ = 180 (68th %'ile). LAD 0-24p, D2 0-24%, LCX 50-69ost (CTFFR normal); d. 03/2024 Cor CTA: LMnl, LAD nl, D1 1-24% ost/p, D2 min prox Ca2+, LCX 1-24% ost, RCA nl.   Pulmonary nodule    a. 03/2024 CTA chest: Stable 2 mm anterior right middle lobe pulmonary nodule.   Sleep apnea    C-PAP   SOB (shortness of breath) 2019   after climbing stairs    Past Surgical History:  Procedure Laterality Date   COLONOSCOPY  01/2021   CYSTOSCOPY W/ URETERAL STENT PLACEMENT Right 07/04/2021   Procedure: CYSTOSCOPY WITH RETROGRADE PYELOGRAM/DIAGNOSTIC URETEROSCOPY/URETERAL STENT PLACEMENT;  Surgeon: Cam Morene ORN, MD;  Location: [facility];  Service: Urology;  Laterality: Right;   CYSTOSCOPY/URETEROSCOPY/HOLMIUM LASER/STENT PLACEMENT Right 07/25/2021   Procedure: CYSTOSCOPY, URETEROSCOPY, PYELOSCOPY, HOLMIUM LASER, STENT EXCHANGE;  Surgeon: Rosalind Zachary NOVAK, MD;  Location: [facility];  Service: Urology;  Laterality: Right;  1 HR   EXTRACORPOREAL SHOCK WAVE LITHOTRIPSY Right 06/26/2021   Procedure: EXTRACORPOREAL SHOCK WAVE LITHOTRIPSY (ESWL);  Surgeon: Carolee Sherwood JONETTA DOUGLAS, MD;  Location: [facility];  Service: Urology;  Laterality: Right;   LEFT HEART CATH AND CORONARY ANGIOGRAPHY Left 10/29/2017   Procedure: LEFT HEART CATH AND CORONARY ANGIOGRAPHY;  Surgeon: Mady Bruckner, MD;  Location: [facility];  Service: Cardiovascular;  Laterality: Left;   SKIN CANCER EXCISION     TONSILLECTOMY  1965   as a child    Social History    Tobacco Use   Smoking status: Some Days    Types: Cigars    Passive exposure: Current   Smokeless tobacco: Never   Tobacco comments:    Smokes 1-2 cigars every 6 months updated 12/06/2023 am  Vaping Use   Vaping status: Never Used  Substance Use Topics   Alcohol use: Yes    Alcohol/week: 2.0 standard drinks of alcohol    Types: 1 Cans of beer, 1 Standard drinks or equivalent per week    Comment: occasional drink beer   Drug use: No    Family History  Problem Relation Age of Onset   Cancer Mother    Colon cancer Mother        deceased age 59   Diabetes Mother    Hypertension Mother    Cancer Father    Lung cancer Father        deceased age 49   Heart disease Father        Bypass in mid-late 50's   Heart attack Father        20   Sinusitis Sister    Diabetes Sister    CAD Brother    Lung cancer Maternal Grandfather    Colon polyps Neg Hx    Esophageal cancer Neg Hx    Stomach cancer Neg Hx    Rectal cancer Neg Hx     Allergies  Allergen Reactions   Atorvastatin  Other (See Comments)    Hands swelled   Tape     Caused redness    Medication list has been reviewed and updated.  Current Outpatient Medications on File Prior to Visit  Medication Sig Dispense Refill   aspirin  EC 81 MG tablet Take 81 mg by mouth every other day. Swallow whole.     losartan  (COZAAR ) 50 MG tablet TAKE 1 TABLET (50 MG TOTAL) BY MOUTH DAILY. 90 tablet 3   nitroGLYCERIN  (NITROSTAT ) 0.4 MG SL tablet Place 1 tablet (0.4 mg total) under the tongue every 5 (five) minutes as needed. 25 tablet 3   Omega-3 Fatty Acids (FISH OIL) 1000 MG CAPS Take 2,000 mg by mouth 2 (two) times daily.     rosuvastatin  (CRESTOR ) 20 MG tablet TAKE 1 TABLET (20 MG TOTAL) BY MOUTH DAILY. 90 tablet 3   No current facility-administered medications on file prior to visit.    Review of Systems:  As per HPI- otherwise negative.   Physical Examination: There were no vitals filed for this visit. There were no  vitals filed for this visit. There is no height or weight on file to calculate BMI. Ideal Body Weight:    GEN: no acute distress. HEENT: Atraumatic, Normocephalic.  Ears and Nose: No external deformity. CV: RRR, No M/G/R. No JVD. No thrill. No extra heart sounds. PULM: CTA B, no wheezes, crackles, rhonchi. No retractions. No resp. distress. No accessory muscle use. ABD: S,  NT, ND, +BS. No rebound. No HSM. EXTR: No c/c/e PSYCH: Normally interactive. Conversant.    Assessment and Plan: No diagnosis found.  Assessment & Plan   Signed Harlene Schroeder, MD

## 2024-06-16 NOTE — Telephone Encounter (Signed)
 Appt scheduled

## 2024-06-17 ENCOUNTER — Encounter: Payer: Self-pay | Admitting: Family Medicine

## 2024-06-17 ENCOUNTER — Ambulatory Visit: Admitting: Family Medicine

## 2024-06-17 VITALS — BP 132/74 | HR 70 | Temp 98.5°F | Ht 69.0 in | Wt 236.0 lb

## 2024-06-17 DIAGNOSIS — R2232 Localized swelling, mass and lump, left upper limb: Secondary | ICD-10-CM

## 2024-06-18 ENCOUNTER — Ambulatory Visit (HOSPITAL_BASED_OUTPATIENT_CLINIC_OR_DEPARTMENT_OTHER)
Admission: RE | Admit: 2024-06-18 | Discharge: 2024-06-18 | Disposition: A | Discharge status: Home / Self Care | Source: Ambulatory Visit | Attending: Family Medicine | Admitting: Family Medicine

## 2024-06-18 DIAGNOSIS — R2232 Localized swelling, mass and lump, left upper limb: Secondary | ICD-10-CM | POA: Insufficient documentation

## 2024-06-21 ENCOUNTER — Encounter: Payer: Self-pay | Admitting: Family Medicine

## 2024-06-26 DIAGNOSIS — C4490 Unspecified malignant neoplasm of skin, unspecified: Secondary | ICD-10-CM | POA: Insufficient documentation

## 2024-07-07 ENCOUNTER — Encounter: Payer: Self-pay | Admitting: Family

## 2024-07-15 ENCOUNTER — Ambulatory Visit (INDEPENDENT_AMBULATORY_CARE_PROVIDER_SITE_OTHER): Admitting: *Deleted

## 2024-07-15 VITALS — BP 132/73 | HR 58 | Temp 97.5°F | Resp 16 | Ht 69.0 in | Wt 233.8 lb

## 2024-07-15 DIAGNOSIS — Z0000 Encounter for general adult medical examination without abnormal findings: Secondary | ICD-10-CM | POA: Diagnosis not present

## 2024-07-15 NOTE — Progress Notes (Signed)
 Chief Complaint  Patient presents with   Medicare Wellness     Subjective:   Ryan [name]. is a 68 y.o. male who presents for a Medicare Annual Wellness Visit.  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 10/2019 Echo: EF 60-65%, no rwma, GrII DD, nl RV fxn. Mildly dil LA.   Fatty liver    fatty liver disease   History of indigestion    History of kidney stones    Hyperlipidemia    Hypertension    Non-obstructive CAD (coronary artery disease)    a. 2016 Neg MV; b. 10/2017 Cath: LM nl, LAD 43m, D1/2/3 nl, LCX 20p, RCA nl, EF 50-55%; c. 09/2022 Cor CTA: Ca2+ = 180 (68th %'ile). LAD 0-24p, D2 0-24%, LCX 50-69ost (CTFFR normal); d. 03/2024 Cor CTA: LMnl, LAD nl, D1 1-24% ost/p, D2 min prox Ca2+, LCX 1-24% ost, RCA nl.   Pulmonary nodule    a. 03/2024 CTA chest: Stable 2 mm anterior right middle lobe pulmonary nodule.   Skin cancer 06/26/2024   well differentiated squamous cell carcinoma left forarm- [facility]   Sleep apnea    C-PAP   SOB (shortness of breath) 2019   after climbing stairs   Past Surgical History:  Procedure Laterality Date   COLONOSCOPY  01/2021   CYSTOSCOPY W/ URETERAL STENT PLACEMENT Right 07/04/2021   Procedure: CYSTOSCOPY WITH RETROGRADE PYELOGRAM/DIAGNOSTIC URETEROSCOPY/URETERAL STENT PLACEMENT;  Surgeon: Cam Morene ORN, MD;  Location[facility];  Service: Urology;  Laterality: Right;   CYSTOSCOPY/URETEROSCOPY/HOLMIUM LASER/STENT PLACEMENT Right 07/25/2021   Procedure: CYSTOSCOPY, URETEROSCOPY,  PYELOSCOPY, HOLMIUM LASER, STENT EXCHANGE;  Surgeon: Rosalind Zachary NOVAK, MD;  Location[facility];  Service: Urology;  Laterality: Right;  1 HR   EXTRACORPOREAL SHOCK WAVE LITHOTRIPSY Right 06/26/2021   Procedure: EXTRACORPOREAL SHOCK WAVE LITHOTRIPSY (ESWL);  Surgeon: Carolee Sherwood JONETTA DOUGLAS, MD;  Location: [facility];  Service: Urology;  Laterality: Right;   LEFT HEART CATH AND CORONARY ANGIOGRAPHY Left 10/29/2017   Procedure: LEFT HEART CATH AND CORONARY ANGIOGRAPHY;  Surgeon: Mady Bruckner, MD;  Location: [facility];  Service: Cardiovascular;  Laterality: Left;   SKIN CANCER EXCISION     TONSILLECTOMY  1965   as a child   Family History  Problem Relation Age of Onset   Cancer Mother    Colon cancer Mother        deceased age 53   Diabetes Mother    Hypertension Mother    Cancer Father    Lung cancer Father        deceased age 92   Heart disease Father        Bypass in mid-late 50's   Heart attack Father        102   Sinusitis Sister    Diabetes Sister    CAD Brother    Lung cancer Maternal Grandfather    Colon polyps Neg Hx    Esophageal cancer  Neg Hx    Stomach cancer Neg Hx    Rectal cancer Neg Hx    Social History   Occupational History   Occupation: Banker: [employer]  Tobacco Use   Smoking status: Some Days    Types: Cigars    Passive exposure: Current   Smokeless tobacco: Never   Tobacco comments:    Smokes 1-2 cigars every 6 months updated 12/06/2023 am  Vaping Use   Vaping status: Never Used  Substance and Sexual Activity   Alcohol use: Yes    Alcohol/week: 2.0 standard drinks of alcohol    Types: 1 Cans of beer, 1 Standard drinks or equivalent per week    Comment: occasional drink beer   Drug use: No   Sexual activity: Not on file   Tobacco Counseling Ready to quit: Not Answered Counseling given: Not Answered Tobacco comments: Smokes 1-2 cigars every 6 months updated 12/06/2023 am  SDOH Screenings    Food Insecurity: No Food Insecurity (07/15/2024)  Housing: Low Risk  (07/15/2024)  Transportation Needs: No Transportation Needs (07/15/2024)  Utilities: Not At Risk (07/15/2024)  Depression (PHQ2-9): Low Risk  (06/17/2024)  Physical Activity: Insufficiently Active (07/15/2024)  Social Connections: Moderately Isolated (07/15/2024)  Stress: No Stress Concern Present (07/15/2024)  Tobacco Use: High Risk (07/15/2024)   See flowsheets for full screening details  Depression Screen PHQ 2 & 9 Depression Scale- Over the past 2 weeks, how often have you been bothered by any of the following problems? Little interest or pleasure in doing things: 0 Feeling down, depressed, or hopeless (PHQ Adolescent also includes...irritable): 0 PHQ-2 Total Score: 0 Trouble falling or staying asleep, or sleeping too much: 0 Feeling tired or having little energy: 1 Poor appetite or overeating (PHQ Adolescent also includes...weight loss): 0 Feeling bad about yourself - or that you are a failure or have let yourself or your family down: 0 Trouble concentrating on things, such as reading the newspaper or watching television (PHQ Adolescent also includes...like school work): 0 Moving or speaking so slowly that other people could have noticed. Or the opposite - being so fidgety or restless that you have been moving around a lot more than usual: 0 Thoughts that you would be better off dead, or of hurting yourself in some way: 0 PHQ-9 Total Score: 1 If you checked off any problems, how difficult have these problems made it for you to do your work, take care of things at home, or get along with other people?: Not difficult at all  Depression Treatment Depression Interventions/Treatment : EYV7-0 Score <4 Follow-up Not Indicated     Goals Addressed   None    Visit info / Clinical Intake: Medicare Wellness Visit Type:: Initial Annual Wellness Visit Persons participating in visit:: patient Medicare Wellness Visit  Mode:: In-person (required for WTM) Information given by:: patient Interpreter Needed?: No Pre-visit prep was completed: yes AWV questionnaire completed by patient prior to visit?: no Living arrangements:: lives with spouse/significant other Patient's Overall Health Status Rating: good Typical amount of pain: some (has trigger finger, muscle stiffness) Does pain affect daily life?: no Are you currently prescribed opioids?: no  Dietary Habits and Nutritional Risks How many meals a day?: 3 Eats fruit and vegetables daily?: yes Most meals are obtained by: preparing own meals In the last 2 weeks, have you had any of the following?: none Diabetic:: no  Functional Status Activities of Daily Living (to include ambulation/medication): Independent Ambulation: Independent Medication Administration: Independent Home  Management: Independent Manage your own finances?: yes Primary transportation is: driving Concerns about vision?: no *vision screening is required for WTM* (goes every other year and up to date with [facility]) Concerns about hearing?: (!) yes (has ringing in left ear x 3 yrs; PCP aware. Not ready for hearing aids) Uses hearing aids?: no  Fall Screening Falls in the past year?: 0 Number of falls in past year: 0 Was there an injury with Fall?: 0 Fall Risk Category Calculator: 0 Patient Fall Risk Level: Low Fall Risk  Fall Risk Patient at Risk for Falls Due to: No Fall Risks Fall risk Follow up: Falls evaluation completed  Home and Transportation Safety: All rugs have non-skid backing?: (!) no All stairs or steps have railings?: yes Grab bars in the bathtub or shower?: (!) no Have non-skid surface in bathtub or shower?: (!) no Good home lighting?: yes Regular seat belt use?: yes Hospital stays in the last year:: no  Cognitive Assessment Difficulty concentrating, remembering, or making decisions? : yes (notices some forgetfulness) Will 6CIT or Mini Cog be Completed:  yes What year is it?: 0 points What month is it?: 0 points Give patient an address phrase to remember (5 components): [address] [place]  About what time is it?: 0 points Count backwards from 20 to 1: 0 points Say the months of the year in reverse: 0 points Repeat the address phrase from earlier: 0 points 6 CIT Score: 0 points  Advance Directives (For Healthcare) Does Patient Have a Medical Advance Directive?: No Would patient like information on creating a medical advance directive?: Yes (MAU/Ambulatory/Procedural Areas - Information given)  Reviewed/Updated  Reviewed/Updated: Reviewed All (Medical, Surgical, Family, Medications, Allergies, Care Teams, Patient Goals)        Objective:    Today's Vitals   07/15/24 1101  BP: 132/73  Pulse: (!) 58  Resp: 16  Temp: (!) 97.5 F (36.4 C)  TempSrc: Oral  SpO2: 98%  Weight: 233 lb 12.8 oz (106.1 kg)  Height: 5' 9 (1.753 m)   Body mass index is 34.53 kg/m.  Current Medications (verified) Outpatient Encounter Medications as of 07/15/2024  Medication Sig   aspirin  EC 81 MG tablet Take 81 mg by mouth every other day. Swallow whole.   losartan  (COZAAR ) 50 MG tablet TAKE 1 TABLET (50 MG TOTAL) BY MOUTH DAILY.   nitroGLYCERIN  (NITROSTAT ) 0.4 MG SL tablet Place 1 tablet (0.4 mg total) under the tongue every 5 (five) minutes as needed.   Omega-3 Fatty Acids (FISH OIL) 1000 MG CAPS Take 2,000 mg by mouth 2 (two) times daily.   rosuvastatin  (CRESTOR ) 20 MG tablet TAKE 1 TABLET (20 MG TOTAL) BY MOUTH DAILY.   No facility-administered encounter medications on file as of 07/15/2024.   Hearing/Vision screen No results found. Immunizations and Health Maintenance Health Maintenance  Topic Date Due   Zoster Vaccines- Shingrix  (2 of 2) 01/28/2023   Influenza Vaccine  11/24/2024 (Originally 03/27/2024)   Pneumococcal Vaccine: 50+ Years (1 of 2 - PCV) 07/15/2025 (Originally 02/03/1975)   COVID-19 Vaccine (5 - 2025-26 season)  07/15/2025 (Originally 04/27/2024)   Medicare Annual Wellness (AWV)  07/15/2025   Colonoscopy  02/15/2026   DTaP/Tdap/Td (3 - Td or Tdap) 01/06/2033   Meningococcal B Vaccine  Aged Out   Hepatitis C Screening  Discontinued    Pt declined all vaccines, may consider Shingles at pharmacy.    Assessment/Plan:  This is a routine wellness examination for Montrez.  Patient Care Team: O'Sullivan, Melissa,  NP as PCP - General (Internal Medicine) End, Lonni, MD as PCP - Cardiology (Cardiology) Neysa Reggy BIRCH, MD as Consulting Physician (Pulmonary Disease) Sheldon Standing, MD as Consulting Physician (General Surgery) Abran Norleen SAILOR, MD as Consulting Physician (Gastroenterology) Rosalind Zachary NOVAK, MD (Inactive) as Consulting Physician (Urology) Burton, Lacie K, NP as Nurse Practitioner (Medical Oncology) Marcey Standing PARAS, MD as Consulting Physician (Ophthalmology)  I have personally reviewed and noted the following in the patient's chart:   Medical and social history Use of alcohol, tobacco or illicit drugs  Current medications and supplements including opioid prescriptions. Functional ability and status Nutritional status Physical activity Advanced directives List of other physicians Hospitalizations, surgeries, and ER visits in previous 12 months Vitals Screenings to include cognitive, depression, and falls Referrals and appointments  No orders of the defined types were placed in this encounter.  In addition, I have reviewed and discussed with patient certain preventive protocols, quality metrics, and best practice recommendations. A written personalized care plan for preventive services as well as general preventive health recommendations were provided to patient.   Lolita Libra, CMA   07/15/2024   Return in 1 year (on 07/15/2025).  After Visit Summary: (In Person-Printed) AVS printed and given to the patient  Nurse Notes: nothing significant to report

## 2024-07-15 NOTE — Patient Instructions (Addendum)
 [name],  Thank you for taking the time for your Medicare Wellness Visit. I appreciate your continued commitment to your health goals. Please review the care plan we discussed, and feel free to reach out if I can assist you further.  Please note that Annual Wellness Visits do not include a physical exam. Some assessments may be limited, especially if the visit was conducted virtually. If needed, we may recommend an in-person follow-up with your provider.  Ongoing Care Seeing your primary care provider every 3 to 6 months helps us  monitor your health and provide consistent, personalized care.   Eleanor Ponto, NP: 09/09/24 10:20am Annual Wellness Visit: 07/16/25 11am, in person   Recommended Screenings:  You will need to get the following vaccines at your local pharmacy: Shingles, pneumonia  Health Maintenance  Topic Date Due   Medicare Annual Wellness Visit  Never done   Zoster (Shingles) Vaccine (2 of 2) 01/28/2023   Flu Shot  11/24/2024*   Pneumococcal Vaccine for age over 22 (1 of 2 - PCV) 07/15/2025*   COVID-19 Vaccine (5 - 2025-26 season) 07/15/2025*   Colon Cancer Screening  02/15/2026   DTaP/Tdap/Td vaccine (3 - Td or Tdap) 01/06/2033   Meningitis B Vaccine  Aged Out   Hepatitis C Screening  Discontinued  *Topic was postponed. The date shown is not the original due date.       04/22/2024   10:29 AM  Advanced Directives  Does Patient Have a Medical Advance Directive? No  Would patient like information on creating a medical advance directive? No - Patient declined  Once completed and notarized, you may return a copy of your Advanced Directive(s) by either of the following:  Bring a copy of your health care power of attorney and living will to the office to be added to your chart at your convenience. You can mail a copy to [facility] [address] [place], [place] [address] or email to [email or web address]   Vision: Annual vision  screenings are recommended for early detection of glaucoma, cataracts, and diabetic retinopathy. These exams can also reveal signs of chronic conditions such as diabetes and high blood pressure.  Dental: Annual dental screenings help detect early signs of oral cancer, gum disease, and other conditions linked to overall health, including heart disease and diabetes.  Please see the attached documents for additional preventive care recommendations.

## 2024-09-09 ENCOUNTER — Encounter: Payer: Self-pay | Admitting: Family

## 2024-09-09 ENCOUNTER — Ambulatory Visit: Admitting: Family

## 2024-09-09 VITALS — BP 137/64 | HR 62 | Temp 98.5°F | Resp 16 | Ht 69.0 in | Wt 240.0 lb

## 2024-09-09 DIAGNOSIS — C7A012 Malignant carcinoid tumor of the ileum: Secondary | ICD-10-CM | POA: Diagnosis not present

## 2024-09-09 DIAGNOSIS — I10 Essential (primary) hypertension: Secondary | ICD-10-CM | POA: Diagnosis not present

## 2024-09-09 DIAGNOSIS — R635 Abnormal weight gain: Secondary | ICD-10-CM | POA: Diagnosis not present

## 2024-09-09 DIAGNOSIS — G4733 Obstructive sleep apnea (adult) (pediatric): Secondary | ICD-10-CM | POA: Diagnosis not present

## 2024-09-09 DIAGNOSIS — E785 Hyperlipidemia, unspecified: Secondary | ICD-10-CM | POA: Diagnosis not present

## 2024-09-09 DIAGNOSIS — R232 Flushing: Secondary | ICD-10-CM | POA: Diagnosis not present

## 2024-09-09 DIAGNOSIS — I5032 Chronic diastolic (congestive) heart failure: Secondary | ICD-10-CM | POA: Diagnosis not present

## 2024-09-09 DIAGNOSIS — D099 Carcinoma in situ, unspecified: Secondary | ICD-10-CM

## 2024-09-09 DIAGNOSIS — Z125 Encounter for screening for malignant neoplasm of prostate: Secondary | ICD-10-CM | POA: Diagnosis not present

## 2024-09-09 LAB — COMPREHENSIVE METABOLIC PANEL WITH GFR
ALT: 17 U/L (ref 3–53)
AST: 18 U/L (ref 5–37)
Albumin: 4.6 g/dL (ref 3.5–5.2)
Alkaline Phosphatase: 60 U/L (ref 39–117)
BUN: 21 mg/dL (ref 6–23)
CO2: 31 meq/L (ref 19–32)
Calcium: 9.5 mg/dL (ref 8.4–10.5)
Chloride: 103 meq/L (ref 96–112)
Creatinine, Ser: 1.06 mg/dL (ref 0.40–1.50)
GFR: 73.09 mL/min
Glucose, Bld: 98 mg/dL (ref 70–99)
Potassium: 4.6 meq/L (ref 3.5–5.1)
Sodium: 138 meq/L (ref 135–145)
Total Bilirubin: 0.5 mg/dL (ref 0.2–1.2)
Total Protein: 7.1 g/dL (ref 6.0–8.3)

## 2024-09-09 LAB — LIPID PANEL
Cholesterol: 117 mg/dL (ref 28–200)
HDL: 47.4 mg/dL
LDL Cholesterol: 57 mg/dL (ref 10–99)
NonHDL: 69.96
Total CHOL/HDL Ratio: 2
Triglycerides: 65 mg/dL (ref 10.0–149.0)
VLDL: 13 mg/dL (ref 0.0–40.0)

## 2024-09-09 LAB — TSH: TSH: 2.23 u[IU]/mL (ref 0.35–5.50)

## 2024-09-09 LAB — PSA, MEDICARE: PSA: 1.41 ng/mL (ref 0.10–4.00)

## 2024-09-09 NOTE — Patient Instructions (Signed)
" °  VISIT SUMMARY: Ryan [name]., a 69 year old male, came in for his annual physical exam. He reported a single episode of a hot flash and a lump in his arm, which has since resolved. He has gained seven pounds since before Thanksgiving, continues to smoke cigars occasionally, and uses a CPAP machine for sleep apnea. He has a history of squamous cell carcinoma and is under regular dermatology surveillance. He also reports hearing loss in his left ear and a general decrease in strength and balance. He is looking forward to his wife's retirement next year.  YOUR PLAN: -ESSENTIAL HYPERTENSION: Hypertension means high blood pressure. Your blood pressure is well-controlled with your current medication, losartan  50 mg daily. Continue taking this medication as prescribed.  -HYPERLIPIDEMIA: Hyperlipidemia means high cholesterol levels. Your cholesterol is well-managed with Crestor  20 mg daily. Continue taking this medication as prescribed.  -OBSTRUCTIVE SLEEP APNEA: Obstructive sleep apnea is a condition where breathing stops and starts during sleep. You are using your CPAP machine every night with good compliance. Continue using the CPAP machine as directed.  -MALIGNANT CARCINOID TUMOR OF ILEUM: This is a type of cancer in the small intestine. You are under oncology surveillance and your next follow-up is in February. A colonoscopy is planned for 2027. Continue with your scheduled oncology follow-ups.  -ABNORMAL WEIGHT GAIN: You have gained 7 pounds since before Thanksgiving, likely due to holiday eating. It is important to adopt healthy eating habits to manage your weight.  -HOT FLASH: You experienced a single episode of a hot flash in October or November with no recurrence. Thyroid  function tests have been ordered to rule out any thyroid  issues.  -SQUAMOUS CELL CARCINOMA OF SKIN, UNDER SURVEILLANCE: This is a type of skin cancer. You are under regular dermatology surveillance and new lesions are  treated with cryotherapy. Continue your annual dermatology visits and use sunscreen regularly.  -GENERAL HEALTH MAINTENANCE: Your shingles vaccine series is incomplete. Consider obtaining the second dose at the pharmacy.  INSTRUCTIONS: Follow up with oncology in February for your malignant carcinoid tumor of the ileum. Continue with your annual dermatology visits for squamous cell carcinoma surveillance. Obtain the second dose of the shingles vaccine at the pharmacy. Thyroid  function tests have been ordered to investigate the cause of your hot flash.                                           "

## 2024-09-09 NOTE — Assessment & Plan Note (Signed)
 Maintained on crestor  20mg .  Lab Results  Component Value Date   CHOL 117 04/09/2024   HDL 44 04/09/2024   LDLCALC 53 04/09/2024   LDLDIRECT 53 04/09/2024   TRIG 111 04/09/2024   CHOLHDL 2.7 04/09/2024   Last LDL at goal.  Continue crestor .

## 2024-09-09 NOTE — Assessment & Plan Note (Signed)
 BP Readings from Last 3 Encounters:  09/09/24 137/64  07/15/24 132/73  06/17/24 132/74   BP stable on losartan  50mg . Continue same.

## 2024-09-09 NOTE — Progress Notes (Signed)
 "  Subjective:     Patient ID: Ryan [name]., male    DOB: 10/18/1955, 69 y.o.   MRN: [id number]  Chief Complaint  Patient presents with   Annual Exam    HPI  Discussed the use of AI scribe software for clinical note transcription with the patient, who gave verbal consent to proceed.  History of Present Illness Ryan [name]. is a 69 year old male who presents for an annual physical exam.  In October or November, he experienced a single episode of a hot flash, described as similar to the sensation during an MRI with contrast. This occurred while sitting and was not associated with any activity. Shortly after, he noticed a lump in his arm, identified as a lymph node. An ultrasound showed it to be normal, and the lump has since decreased in size and has not recurred.  He is currently taking losartan  50 mg for hypertension and Crestor  20 mg for hyperlipidemia.  He reports a weight gain of seven pounds since before Thanksgiving, attributing it to holiday eating. He continues to smoke cigars occasionally, estimating about two dozen in the past year. No recent chest pain, shortness of breath, or cough. Occasional back pain is attributed to physical activities such as cutting wood. He uses a CPAP machine every night for sleep apnea and reports good compliance.  He has a history of squamous cell carcinoma and sees a dermatologist annually. He uses sunscreen regularly and denies any recent skin issues but mentions often finding new spots a few months after dermatology visits.  He reports hearing loss in his left ear but has not had it formally tested. He does not feel it is severe enough to warrant hearing aids at this time.  No recent digestive issues such as constipation, diarrhea, or blood in the stool, though he reports a sensitive stomach to certain foods.  No recent muscle or joint pain but notes a general decrease in strength and balance, attributed to aging. He continues to engage  in physical activities such as cutting wood and walking to the mailbox.  No symptoms of depression or anxiety. He mentions that his wife is planning to retire in a year, which he is looking forward to.   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 10/2019 Echo: EF 60-65%, no rwma, GrII DD, nl RV fxn. Mildly dil LA.   Fatty liver    fatty liver disease   History of indigestion    History of kidney stones    Hyperlipidemia    Hypertension    Non-obstructive CAD (coronary  artery disease)    a. 2016 Neg MV; b. 10/2017 Cath: LM nl, LAD 61m, D1/2/3 nl, LCX 20p, RCA nl, EF 50-55%; c. 09/2022 Cor CTA: Ca2+ = 180 (68th %'ile). LAD 0-24p, D2 0-24%, LCX 50-69ost (CTFFR normal); d. 03/2024 Cor CTA: LMnl, LAD nl, D1 1-24% ost/p, D2 min prox Ca2+, LCX 1-24% ost, RCA nl.   Pulmonary nodule    a. 03/2024 CTA chest: Stable 2 mm anterior right middle lobe pulmonary nodule.   Skin cancer 06/26/2024   well differentiated squamous cell carcinoma left forarm- [facility]- [facility]   Sleep apnea    C-PAP   SOB (shortness of breath) 2019   after climbing stairs    Past Surgical History:  Procedure Laterality Date   COLONOSCOPY  01/2021   CYSTOSCOPY W/ URETERAL STENT PLACEMENT  Right 07/04/2021   Procedure: CYSTOSCOPY WITH RETROGRADE PYELOGRAM/DIAGNOSTIC URETEROSCOPY/URETERAL STENT PLACEMENT;  Surgeon: Cam Morene ORN, MD;  Location: [facility];  Service: Urology;  Laterality: Right;   CYSTOSCOPY/URETEROSCOPY/HOLMIUM LASER/STENT PLACEMENT Right 07/25/2021   Procedure: CYSTOSCOPY, URETEROSCOPY, PYELOSCOPY, HOLMIUM LASER, STENT EXCHANGE;  Surgeon: Rosalind Zachary NOVAK, MD;  Location: [facility];  Service: Urology;  Laterality: Right;  1 HR   EXTRACORPOREAL SHOCK WAVE LITHOTRIPSY Right 06/26/2021   Procedure: EXTRACORPOREAL SHOCK WAVE LITHOTRIPSY (ESWL);  Surgeon: Carolee Sherwood JONETTA DOUGLAS, MD;  Location: [facility];  Service: Urology;  Laterality: Right;   LEFT HEART CATH AND CORONARY ANGIOGRAPHY Left 10/29/2017   Procedure: LEFT HEART CATH AND CORONARY ANGIOGRAPHY;  Surgeon: Mady Bruckner, MD;  Location: [facility];  Service: Cardiovascular;  Laterality: Left;   SKIN CANCER EXCISION     TONSILLECTOMY  1965   as a child    Family History  Problem Relation Age of Onset   Cancer Mother    Colon cancer Mother        deceased age 50   Diabetes Mother    Hypertension Mother    Cancer Father    Lung cancer Father        deceased age 45   Heart disease Father        Bypass in mid-late 24's   Heart attack Father        2   Sinusitis Sister    Diabetes Sister    CAD Brother    Lung cancer Maternal Grandfather    Colon polyps Neg Hx    Esophageal cancer Neg Hx    Stomach cancer Neg Hx    Rectal cancer Neg Hx     Social History   Socioeconomic History   Marital status: Married    Spouse name: Not on file   Number of children: 1   Years of education: Not on file   Highest education level: Not on file  Occupational History   Occupation: Banker: [employer]  Tobacco Use   Smoking status: Some Days    Types: Cigars    Passive exposure: Current   Smokeless tobacco: Never   Tobacco comments:    Smokes 1-2 cigars  every 6 months updated 12/06/2023 am  Vaping Use   Vaping status: Never Used  Substance and Sexual Activity   Alcohol use: Yes    Alcohol/week: 2.0 standard drinks of alcohol    Types: 1 Cans of beer, 1 Standard drinks or equivalent per week    Comment: occasional drink beer   Drug use: No   Sexual activity: Not on file  Other Topics Concern  Not on file  Social History Narrative   Retired games developer    Social Drivers of Health   Tobacco Use: High Risk (09/09/2024)   Patient History    Smoking Tobacco Use: Some Days    Smokeless Tobacco Use: Never    Passive Exposure: Current  Financial Resource Strain: Not on file  Food Insecurity: No Food Insecurity (07/15/2024)   Epic    Worried About Programme Researcher, Broadcasting/film/video in the Last Year: Never true    Ran Out of Food in the Last Year: Never true  Transportation Needs: No Transportation Needs (07/15/2024)   Epic    Lack of Transportation (Medical): No    Lack of Transportation (Non-Medical): No  Physical Activity: Insufficiently Active (07/15/2024)   Exercise Vital Sign    Days of Exercise per Week: 2 days    Minutes of Exercise per Session: 60 min  Stress: No Stress Concern Present (07/15/2024)   [employer] of Occupational Health - Occupational Stress Questionnaire    Feeling of Stress: Not at all  Social Connections: Moderately Isolated (07/15/2024)   Social Connection and Isolation Panel    Frequency of Communication with Friends and Family: More than three times a week    Frequency of Social Gatherings with Friends and Family: More than three times a week    Attends Religious Services: Never    Database Administrator or Organizations: No    Attends Banker Meetings: Never    Marital Status: Married  Catering Manager Violence: Not At Risk (07/15/2024)   Epic    Fear of Current or Ex-Partner: No    Emotionally Abused: No    Physically Abused: No    Sexually Abused: No  Depression (PHQ2-9): Low Risk  (06/17/2024)   Depression (PHQ2-9)    PHQ-2 Score: 1  Alcohol Screen: Not on file  Housing: Low Risk (07/15/2024)   Epic    Unable to Pay for Housing in the Last Year: No    Number of Times Moved in the Last Year: 0    Homeless in the Last Year: No  Utilities: Not At Risk (07/15/2024)   Epic    Threatened with loss of utilities: No  Health Literacy: Not on file    Outpatient Medications Prior to Visit  Medication Sig Dispense Refill   aspirin  EC 81 MG tablet Take 81 mg by mouth every other day. Swallow whole.     losartan  (COZAAR ) 50 MG tablet TAKE 1 TABLET (50 MG TOTAL) BY MOUTH DAILY. 90 tablet 3   nitroGLYCERIN  (NITROSTAT ) 0.4 MG SL tablet Place 1 tablet (0.4 mg total) under the tongue every 5 (five) minutes as needed. 25 tablet 3   Omega-3 Fatty Acids (FISH OIL) 1000 MG CAPS Take 2,000 mg by mouth 2 (two) times daily.     rosuvastatin  (CRESTOR ) 20 MG tablet TAKE 1 TABLET (20 MG TOTAL) BY MOUTH DAILY. 90 tablet 3   No facility-administered medications prior to visit.    Allergies[1]  Review of Systems  HENT:  Positive for hearing loss. Negative for congestion.   Eyes:  Negative for blurred vision.  Respiratory:  Negative for cough.   Cardiovascular:  Negative for leg swelling.  Gastrointestinal:  Negative for blood in stool, constipation and diarrhea.  Genitourinary:  Negative for dysuria and frequency.  Musculoskeletal:  Negative for joint pain and myalgias.  Skin:  Negative for rash.  Neurological:  Negative for headaches.  Psychiatric/Behavioral:  Negative for depression. The patient is not  nervous/anxious.        Objective:    Physical Exam   BP 137/64 (BP Location: Right Arm, Patient Position: Sitting, Cuff Size: Large)   Pulse 62   Temp 98.5 F (36.9 C) (Oral)   Resp 16   Ht 5' 9 (1.753 m)   Wt 240 lb (108.9 kg)   SpO2 100%   BMI 35.44 kg/m  Wt Readings from Last 3 Encounters:  09/09/24 240 lb (108.9 kg)  07/15/24 233 lb 12.8 oz (106.1 kg)   06/17/24 236 lb (107 kg)  Physical Exam  Constitutional: He is oriented to person, place, and time. He appears well-developed and well-nourished. No distress.  HENT:  Head: Normocephalic and atraumatic.  Right Ear: Tympanic membrane and ear canal normal.  Left Ear: Tympanic membrane and ear canal normal.  Mouth/Throat: Oropharynx is clear and moist.  Eyes: Pupils are equal, round, and reactive to light. No scleral icterus.  Neck: Normal range of motion. No thyromegaly present.  Cardiovascular: Normal rate and regular rhythm.   No murmur heard. Pulmonary/Chest: Effort normal and breath sounds normal. No respiratory distress. He has no wheezes. He has no rales. He exhibits no tenderness.  Abdominal: Soft. Bowel sounds are normal. He exhibits no distension and no mass. There is no tenderness. There is no rebound and no guarding.  Musculoskeletal: He exhibits no edema.  Lymphadenopathy:    He has no cervical adenopathy.  Neurological: He is alert and oriented to person, place, and time. He has normal patellar reflexes. He exhibits normal muscle tone. Coordination normal.  Skin: Skin is warm and dry.  Psychiatric: He has a normal mood and affect. His behavior is normal. Judgment and thought content normal.           Assessment & Plan:        Assessment & Plan:   Problem List Items Addressed This Visit       Unprioritized   Squamous cell carcinoma in situ    Under dermatology surveillance.  - Continue annual dermatology visits. - Continue using sunscreen regularly.      Obstructive sleep apnea on CPAP   Stable on cpap.        Hyperlipidemia LDL goal <70   Maintained on crestor  20mg .  Lab Results  Component Value Date   CHOL 117 04/09/2024   HDL 44 04/09/2024   LDLCALC 53 04/09/2024   LDLDIRECT 53 04/09/2024   TRIG 111 04/09/2024   CHOLHDL 2.7 04/09/2024   Last LDL at goal.  Continue crestor .        Relevant Orders   Lipid panel   Hot flash in male    Relevant Orders   TSH   Essential hypertension - Primary   BP Readings from Last 3 Encounters:  09/09/24 137/64  07/15/24 132/73  06/17/24 132/74   BP stable on losartan  50mg . Continue same.       Relevant Orders   Comp Met (CMET)   Diastolic CHF (HCC)   Appears euvolemic today. Not on diuretic.       Chronic heart failure with preserved ejection fraction (HFpEF) (HCC)   Denies DOE.        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       Other Visit Diagnoses       Screening for prostate cancer       Relevant Orders   PSA, Medicare ( [facility] only)  Weight gain       Relevant Orders   TSH      Assessment & Plan     I am having Ryan [name]. maintain his Fish Oil, losartan , nitroGLYCERIN , rosuvastatin , and aspirin  EC.  No orders of the defined types were placed in this encounter.     [1]  Allergies Allergen Reactions   Atorvastatin  Other (See Comments)    Hands swelled   Tape     Caused redness   "

## 2024-09-09 NOTE — Assessment & Plan Note (Signed)
 Appears euvolemic today. Not on diuretic.

## 2024-09-09 NOTE — Assessment & Plan Note (Signed)
 Stable on cpap

## 2024-09-09 NOTE — Assessment & Plan Note (Signed)
 Continues to follow with Oncology for surveillance.

## 2024-09-09 NOTE — Assessment & Plan Note (Signed)
" °  Under dermatology surveillance.  - Continue annual dermatology visits. - Continue using sunscreen regularly. "

## 2024-09-09 NOTE — Assessment & Plan Note (Signed)
 Denies DOE

## 2024-09-10 ENCOUNTER — Ambulatory Visit: Payer: Self-pay | Admitting: Family

## 2024-10-22 ENCOUNTER — Other Ambulatory Visit

## 2024-10-22 ENCOUNTER — Ambulatory Visit: Admitting: Nurse Practitioner

## 2024-11-27 ENCOUNTER — Ambulatory Visit: Admitting: Nurse Practitioner

## 2024-12-15 ENCOUNTER — Ambulatory Visit: Admitting: Pulmonary Disease

## 2025-03-10 ENCOUNTER — Ambulatory Visit: Admitting: Family

## 2025-07-16 ENCOUNTER — Ambulatory Visit
